# Patient Record
Sex: Male | Born: 1965 | Race: White | Hispanic: No | Marital: Single | Smoking: Current some day smoker
Health system: Southern US, Community
[De-identification: ages and names within clinical notes are randomized; demographics above are authoritative.]

## PROBLEM LIST (undated history)

## (undated) DIAGNOSIS — F419 Anxiety disorder, unspecified: Secondary | ICD-10-CM

## (undated) DIAGNOSIS — M549 Dorsalgia, unspecified: Secondary | ICD-10-CM

## (undated) DIAGNOSIS — Z87442 Personal history of urinary calculi: Secondary | ICD-10-CM

## (undated) DIAGNOSIS — M5430 Sciatica, unspecified side: Secondary | ICD-10-CM

## (undated) DIAGNOSIS — N289 Disorder of kidney and ureter, unspecified: Secondary | ICD-10-CM

## (undated) DIAGNOSIS — H548 Legal blindness, as defined in USA: Secondary | ICD-10-CM

## (undated) DIAGNOSIS — G8929 Other chronic pain: Secondary | ICD-10-CM

## (undated) DIAGNOSIS — T884XXA Failed or difficult intubation, initial encounter: Secondary | ICD-10-CM

## (undated) DIAGNOSIS — Z923 Personal history of irradiation: Secondary | ICD-10-CM

## (undated) HISTORY — PX: EYE SURGERY: SHX253

## (undated) HISTORY — PX: APPENDECTOMY: SHX54

---

## 1997-08-26 ENCOUNTER — Emergency Department (HOSPITAL_COMMUNITY): Admission: EM | Admit: 1997-08-26 | Discharge: 1997-08-26 | Payer: Self-pay | Admitting: Emergency Medicine

## 1998-03-20 ENCOUNTER — Inpatient Hospital Stay (HOSPITAL_COMMUNITY): Admission: EM | Admit: 1998-03-20 | Discharge: 1998-03-28 | Payer: Self-pay | Admitting: Emergency Medicine

## 1998-03-20 ENCOUNTER — Encounter: Payer: Self-pay | Admitting: Ophthalmology

## 1998-03-20 ENCOUNTER — Encounter: Payer: Self-pay | Admitting: Emergency Medicine

## 1998-03-21 ENCOUNTER — Encounter: Payer: Self-pay | Admitting: Ophthalmology

## 1998-04-06 ENCOUNTER — Encounter: Payer: Self-pay | Admitting: Ophthalmology

## 1998-04-06 ENCOUNTER — Ambulatory Visit (HOSPITAL_COMMUNITY): Admission: RE | Admit: 1998-04-06 | Discharge: 1998-04-06 | Payer: Self-pay | Admitting: Ophthalmology

## 1998-04-07 ENCOUNTER — Ambulatory Visit (HOSPITAL_COMMUNITY): Admission: RE | Admit: 1998-04-07 | Discharge: 1998-04-08 | Payer: Self-pay | Admitting: Ophthalmology

## 1998-04-17 ENCOUNTER — Ambulatory Visit (HOSPITAL_COMMUNITY): Admission: RE | Admit: 1998-04-17 | Discharge: 1998-04-18 | Payer: Self-pay | Admitting: Ophthalmology

## 1998-05-01 ENCOUNTER — Emergency Department (HOSPITAL_COMMUNITY): Admission: EM | Admit: 1998-05-01 | Discharge: 1998-05-01 | Payer: Self-pay

## 1998-10-02 ENCOUNTER — Ambulatory Visit (HOSPITAL_BASED_OUTPATIENT_CLINIC_OR_DEPARTMENT_OTHER): Admission: RE | Admit: 1998-10-02 | Discharge: 1998-10-02 | Payer: Self-pay | Admitting: General Surgery

## 1999-04-09 ENCOUNTER — Ambulatory Visit (HOSPITAL_COMMUNITY): Admission: RE | Admit: 1999-04-09 | Discharge: 1999-04-09 | Payer: Self-pay | Admitting: Ophthalmology

## 1999-12-14 ENCOUNTER — Emergency Department (HOSPITAL_COMMUNITY): Admission: EM | Admit: 1999-12-14 | Discharge: 1999-12-14 | Payer: Self-pay | Admitting: Emergency Medicine

## 2000-02-24 ENCOUNTER — Encounter: Payer: Self-pay | Admitting: Emergency Medicine

## 2000-02-24 ENCOUNTER — Emergency Department (HOSPITAL_COMMUNITY): Admission: EM | Admit: 2000-02-24 | Discharge: 2000-02-24 | Payer: Self-pay | Admitting: Emergency Medicine

## 2000-07-05 ENCOUNTER — Emergency Department (HOSPITAL_COMMUNITY): Admission: EM | Admit: 2000-07-05 | Discharge: 2000-07-05 | Payer: Self-pay | Admitting: Emergency Medicine

## 2000-07-05 ENCOUNTER — Encounter: Payer: Self-pay | Admitting: Emergency Medicine

## 2000-08-15 ENCOUNTER — Encounter: Payer: Self-pay | Admitting: Emergency Medicine

## 2000-08-15 ENCOUNTER — Emergency Department (HOSPITAL_COMMUNITY): Admission: EM | Admit: 2000-08-15 | Discharge: 2000-08-15 | Payer: Self-pay | Admitting: Emergency Medicine

## 2000-08-22 ENCOUNTER — Emergency Department (HOSPITAL_COMMUNITY): Admission: EM | Admit: 2000-08-22 | Discharge: 2000-08-23 | Payer: Self-pay | Admitting: Emergency Medicine

## 2000-08-23 ENCOUNTER — Ambulatory Visit (HOSPITAL_COMMUNITY): Admission: EM | Admit: 2000-08-23 | Discharge: 2000-08-23 | Payer: Self-pay | Admitting: *Deleted

## 2000-08-23 ENCOUNTER — Encounter: Payer: Self-pay | Admitting: Urology

## 2000-09-27 ENCOUNTER — Emergency Department (HOSPITAL_COMMUNITY): Admission: EM | Admit: 2000-09-27 | Discharge: 2000-09-28 | Payer: Self-pay | Admitting: Emergency Medicine

## 2000-09-28 ENCOUNTER — Encounter: Payer: Self-pay | Admitting: Emergency Medicine

## 2001-03-22 ENCOUNTER — Emergency Department (HOSPITAL_COMMUNITY): Admission: EM | Admit: 2001-03-22 | Discharge: 2001-03-22 | Payer: Self-pay

## 2001-03-22 ENCOUNTER — Emergency Department (HOSPITAL_COMMUNITY): Admission: EM | Admit: 2001-03-22 | Discharge: 2001-03-22 | Payer: Self-pay | Admitting: Emergency Medicine

## 2001-05-16 ENCOUNTER — Emergency Department (HOSPITAL_COMMUNITY): Admission: EM | Admit: 2001-05-16 | Discharge: 2001-05-17 | Payer: Self-pay | Admitting: *Deleted

## 2001-06-11 ENCOUNTER — Emergency Department (HOSPITAL_COMMUNITY): Admission: EM | Admit: 2001-06-11 | Discharge: 2001-06-11 | Payer: Self-pay

## 2001-06-11 ENCOUNTER — Encounter: Payer: Self-pay | Admitting: Emergency Medicine

## 2001-06-27 ENCOUNTER — Emergency Department (HOSPITAL_COMMUNITY): Admission: EM | Admit: 2001-06-27 | Discharge: 2001-06-27 | Payer: Self-pay | Admitting: Emergency Medicine

## 2001-07-01 ENCOUNTER — Emergency Department (HOSPITAL_COMMUNITY): Admission: EM | Admit: 2001-07-01 | Discharge: 2001-07-01 | Payer: Self-pay | Admitting: *Deleted

## 2001-07-04 ENCOUNTER — Emergency Department (HOSPITAL_COMMUNITY): Admission: EM | Admit: 2001-07-04 | Discharge: 2001-07-04 | Payer: Self-pay

## 2002-05-12 ENCOUNTER — Encounter: Payer: Self-pay | Admitting: Emergency Medicine

## 2002-05-12 ENCOUNTER — Emergency Department (HOSPITAL_COMMUNITY): Admission: EM | Admit: 2002-05-12 | Discharge: 2002-05-12 | Payer: Self-pay | Admitting: Emergency Medicine

## 2003-11-03 ENCOUNTER — Emergency Department (HOSPITAL_COMMUNITY): Admission: EM | Admit: 2003-11-03 | Discharge: 2003-11-03 | Payer: Self-pay | Admitting: Emergency Medicine

## 2004-04-09 ENCOUNTER — Emergency Department (HOSPITAL_COMMUNITY): Admission: EM | Admit: 2004-04-09 | Discharge: 2004-04-09 | Payer: Self-pay | Admitting: Emergency Medicine

## 2004-06-13 ENCOUNTER — Emergency Department (HOSPITAL_COMMUNITY): Admission: EM | Admit: 2004-06-13 | Discharge: 2004-06-13 | Payer: Self-pay | Admitting: Emergency Medicine

## 2004-06-24 ENCOUNTER — Emergency Department (HOSPITAL_COMMUNITY): Admission: EM | Admit: 2004-06-24 | Discharge: 2004-06-24 | Payer: Self-pay | Admitting: Emergency Medicine

## 2006-01-06 ENCOUNTER — Emergency Department (HOSPITAL_COMMUNITY): Admission: EM | Admit: 2006-01-06 | Discharge: 2006-01-06 | Payer: Self-pay | Admitting: Emergency Medicine

## 2006-07-17 ENCOUNTER — Emergency Department (HOSPITAL_COMMUNITY): Admission: EM | Admit: 2006-07-17 | Discharge: 2006-07-18 | Payer: Self-pay | Admitting: Emergency Medicine

## 2010-07-20 NOTE — Op Note (Signed)
[facility]  Patient:    Glen Mcmillan, Glen Mcmillan                       MRN: [id number] Proc. Date: 08/23/00 Adm. Date:  [id number] Attending:  Ephriam Knuckles H                           Operative Report  PROCEDURE:  Cystoscopy, right ureteroscopic stone extraction, right ureteral stent insertion.  PREOPERATIVE DIAGNOSIS:  Right ureterovesical junction stone.  POSTOPERATIVE DIAGNOSIS:  Right ureterovesical junction stone.  SURGEON:  Excell Seltzer. Annabell Howells, M.D.  ANESTHESIA:  General.  DRAINS:  6 French x 24 cm double-J stent.  SPECIMENS:  Stone.  COMPLICATIONS:  None.  INDICATIONS:  [name] is a 45 year old white male, who I had seen in the past for an unrelated problem, who presented to the ER today with a one week history of severe right flank pain, nausea, and new onset urinary urgency.  He previously had some symptoms dating back to May and had undergone a prior CT which revealed a right renal stone.  CT scan in the ER today revealed a right UVJ stone measuring approximately 7 mm.  After discussing the options with the patient, he elected ureteroscopy.  He is aware of the risks.  FINDINGS AND PROCEDURE:  The patient was given 1 gram of Ancef.  He was taken to the operating room where a general anesthetic was induced.  He was placed in the lithotomy position.  His perineum and genitalia were prepped with Betadine solution.  He was draped in the usual sterile fashion.  Cystoscopy was performed using the 22 [place] scope and the 12 and 70 degree lens. Examination revealed a normal urethra.  The external sphincter was intact. The prostatic urethra was short without significant lateral lobe enlargement, although the bladder neck was a bit high.  Examination of the bladder revealed a smooth bladder wall without tumor, stones, or inflammation. The ureteral orifices were in the normal anatomic position effluxing clear urine.  The guidewire was passed up the right  ureteral orifice.  Stone could be seen adjacent to the wire on fluoroscopy.  An attempt to pass the 6 French ureteroscope without dilation was unsuccessful.  A 4 cm 15 French balloon was then passed over the wire, and the ureteral meatus was dilated at 12 atmospheres.  After dilation, the 6 [place] scope was then easily passed up ureter.  The stone was visualized, and it was engaged through the 3 [place] flat wire basket.  The stone was removed without difficulty.  The cystoscope was then reinserted over the wire, and a 6 French 24 cm double-J stent was inserted without difficulty to the kidney.  The wire was removed leaving good coil in the kidney and a good coil in the bladder.  The cystoscope was removed after draining the bladder and leaving the stent string exiting the urethra. The stent string was then secured to the patients penis, and the patient was taken down from lithotomy position.  His anesthetic was reversed.  He was moved to the recovery room in stable condition, and there were no complications. DD:  08/23/00 TD:  08/23/00 Job: [id number]
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
## 2011-03-12 ENCOUNTER — Emergency Department (HOSPITAL_COMMUNITY)
Admission: EM | Admit: 2011-03-12 | Discharge: 2011-03-12 | Disposition: A | Discharge status: Home / Self Care | Payer: Self-pay | Attending: Emergency Medicine | Admitting: Emergency Medicine

## 2011-03-12 ENCOUNTER — Encounter: Payer: Self-pay | Admitting: Emergency Medicine

## 2011-03-12 DIAGNOSIS — H9209 Otalgia, unspecified ear: Secondary | ICD-10-CM | POA: Insufficient documentation

## 2011-03-12 DIAGNOSIS — R070 Pain in throat: Secondary | ICD-10-CM | POA: Insufficient documentation

## 2011-03-12 DIAGNOSIS — R110 Nausea: Secondary | ICD-10-CM | POA: Insufficient documentation

## 2011-03-12 DIAGNOSIS — J069 Acute upper respiratory infection, unspecified: Secondary | ICD-10-CM | POA: Insufficient documentation

## 2011-03-12 DIAGNOSIS — J3489 Other specified disorders of nose and nasal sinuses: Secondary | ICD-10-CM | POA: Insufficient documentation

## 2011-03-12 DIAGNOSIS — R0989 Other specified symptoms and signs involving the circulatory and respiratory systems: Secondary | ICD-10-CM | POA: Insufficient documentation

## 2011-03-12 DIAGNOSIS — F17200 Nicotine dependence, unspecified, uncomplicated: Secondary | ICD-10-CM | POA: Insufficient documentation

## 2011-03-12 DIAGNOSIS — R220 Localized swelling, mass and lump, head: Secondary | ICD-10-CM | POA: Insufficient documentation

## 2011-03-12 DIAGNOSIS — R059 Cough, unspecified: Secondary | ICD-10-CM | POA: Insufficient documentation

## 2011-03-12 DIAGNOSIS — R05 Cough: Secondary | ICD-10-CM | POA: Insufficient documentation

## 2011-03-12 DIAGNOSIS — R093 Abnormal sputum: Secondary | ICD-10-CM | POA: Insufficient documentation

## 2011-03-12 MED ORDER — FLUTICASONE PROPIONATE 50 MCG/ACT NA SUSP: 2.0000 | Freq: Every day | NASAL | Status: DC

## 2011-03-12 MED ORDER — HYDROCODONE-ACETAMINOPHEN 5-325 MG PO TABS: 2.0000 | ORAL_TABLET | ORAL | Status: AC | PRN

## 2011-03-12 MED ORDER — ONDANSETRON HCL 4 MG PO TABS: 4.0000 mg | ORAL_TABLET | Freq: Four times a day (QID) | ORAL | Status: AC

## 2011-03-12 MED ORDER — ANTIPYRINE-BENZOCAINE 5.4-1.4 % OT SOLN
3.0000 [drp] | Freq: Once | OTIC | Status: DC
  Filled 2011-03-12: qty 10

## 2011-03-12 NOTE — ED Notes (Signed)
Pt had tooth pulled a week ago on left upper side and placed on Amoxicillin 500mg . Today left ear and throat pain. Pt also c/o of productive green cough. Pt reports fever last night.

## 2011-03-12 NOTE — ED Provider Notes (Addendum)
History     CSN: [id number]  Arrival date & time 03/12/11  1031   First MD Initiated Contact with Patient 03/12/11 1103      No chief complaint on file.   (Consider location/radiation/quality/duration/timing/severity/associated sxs/prior treatment) HPI  46 year old male presenting to the chief complaints of ear pain. Patient states for the past 4 days he has been having pain to both worsen in the left ear, throat irritation, runny nose, cough productive with yellow sputum, nasal congestions, and chest congestion. He denies hearing changes.  Denies taking any other medication except amoxicillin.  He has decreased appetite and body aches. Patient states a week ago he had a dental extraction. He was given pain medication and amoxicillin. He has been taking his amoxicillin and has 2 days left.  No past medical history on file.  Past Surgical History  Procedure Date  . Eye surgery   . Appendectomy     No family history on file.  History  Substance Use Topics  . Smoking status: Current Everyday Smoker  . Smokeless tobacco: Not on file  . Alcohol Use:       Review of Systems  All other systems reviewed and are negative.    Allergies  Review of patient's allergies indicates no known allergies.  Home Medications  No current outpatient prescriptions on file.  BP 100/63  Pulse 97  Temp(Src) 98.4 F (36.9 C) (Oral)  Resp 18  SpO2 99%  Physical Exam  Nursing note and vitals reviewed. Constitutional: He appears well-developed and well-nourished.       Awake, alert, nontoxic appearance  HENT:  Head: Normocephalic and atraumatic.    Right Ear: No drainage.  Left Ear: No drainage.  Ears:  Nose: Mucosal edema and rhinorrhea present.  Mouth/Throat: Uvula is midline and mucous membranes are normal. No dental abscesses. Posterior oropharyngeal edema present. No oropharyngeal exudate, posterior oropharyngeal erythema or tonsillar abscesses.    Eyes: Right eye exhibits  no discharge. Left eye exhibits no discharge.  Neck: Neck supple.  Cardiovascular: Normal rate.   Pulmonary/Chest: Effort normal. No respiratory distress. He has no wheezes. He has no rales. He exhibits no tenderness.  Abdominal: Soft. There is no tenderness. There is no rebound.  Musculoskeletal: He exhibits no tenderness.       Baseline ROM, no obvious new focal weakness  Neurological: He is alert.       Mental status and motor strength appears baseline for patient and situation  Skin: Skin is warm and dry. No rash noted.  Psychiatric: He has a normal mood and affect.    ED Course  Procedures (including critical care time)  Labs Reviewed - No data to display No results found.   No diagnosis found.    MDM  Patient presents with symptoms consistence with upper respiratory infection.  No obvious abscess noted.  He is currently on amoxicillin, which i encourage for him to finish.  I will prescribe flonase, zofran, and short course of pain meds with follow up to ENT.  Pt voice understanding.  He is afebrile and with stable vital sign.          Fayrene Helper, PA-C 03/15/11 0723  Fayrene Helper, PA-C 03/15/11 1336

## 2011-03-15 NOTE — ED Provider Notes (Signed)
Medical screening examination/treatment/procedure(s) were performed by non-physician practitioner and as supervising physician I was immediately available for consultation/collaboration.  Doug Sou, MD 03/15/11 [phone]

## 2011-03-16 NOTE — ED Provider Notes (Signed)
Medical screening examination/treatment/procedure(s) were performed by non-physician practitioner and as supervising physician I was immediately available for consultation/collaboration.  Doug Sou, MD 03/16/11 [phone]

## 2011-03-29 ENCOUNTER — Encounter (HOSPITAL_COMMUNITY): Payer: Self-pay | Admitting: *Deleted

## 2011-03-29 ENCOUNTER — Emergency Department (HOSPITAL_COMMUNITY)
Admission: EM | Admit: 2011-03-29 | Discharge: 2011-03-29 | Disposition: A | Discharge status: Home / Self Care | Payer: Self-pay | Attending: Emergency Medicine | Admitting: Emergency Medicine

## 2011-03-29 DIAGNOSIS — M545 Low back pain, unspecified: Secondary | ICD-10-CM

## 2011-03-29 DIAGNOSIS — M79609 Pain in unspecified limb: Secondary | ICD-10-CM | POA: Insufficient documentation

## 2011-03-29 DIAGNOSIS — R209 Unspecified disturbances of skin sensation: Secondary | ICD-10-CM | POA: Insufficient documentation

## 2011-03-29 DIAGNOSIS — R29898 Other symptoms and signs involving the musculoskeletal system: Secondary | ICD-10-CM | POA: Insufficient documentation

## 2011-03-29 DIAGNOSIS — M79604 Pain in right leg: Secondary | ICD-10-CM

## 2011-03-29 DIAGNOSIS — M62838 Other muscle spasm: Secondary | ICD-10-CM | POA: Insufficient documentation

## 2011-03-29 DIAGNOSIS — M25559 Pain in unspecified hip: Secondary | ICD-10-CM | POA: Insufficient documentation

## 2011-03-29 DIAGNOSIS — F17200 Nicotine dependence, unspecified, uncomplicated: Secondary | ICD-10-CM | POA: Insufficient documentation

## 2011-03-29 MED ORDER — HYDROCODONE-ACETAMINOPHEN 5-325 MG PO TABS: ORAL_TABLET | ORAL | Status: AC

## 2011-03-29 MED ORDER — NAPROXEN 500 MG PO TABS: 500.0000 mg | ORAL_TABLET | Freq: Two times a day (BID) | ORAL | Status: DC

## 2011-03-29 MED ORDER — KETOROLAC TROMETHAMINE 60 MG/2ML IM SOLN
60.0000 mg | Freq: Once | INTRAMUSCULAR | Status: AC
  Administered 2011-03-29: 60 mg via INTRAMUSCULAR
  Filled 2011-03-29: qty 2

## 2011-03-29 NOTE — ED Provider Notes (Signed)
Medical screening examination/treatment/procedure(s) were performed by non-physician practitioner and as supervising physician I was immediately available for consultation/collaboration.   Lataisha Colan A. Lorae Roig, MD 03/29/11 1928 

## 2011-03-29 NOTE — ED Notes (Signed)
Pt states 'honestly my leg has been bothering me x 10 yrs, my medical doctor retired, can't remember the last time I had my leg seen about"; pt c/o RLE pain that begins in the hip & radiates into the leg.
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
## 2011-03-29 NOTE — ED Notes (Signed)
Pt has made an appt with Dr. Barbaraann Faster office for today.

## 2011-04-18 ENCOUNTER — Other Ambulatory Visit: Payer: Self-pay | Admitting: Family Medicine

## 2011-04-18 DIAGNOSIS — M549 Dorsalgia, unspecified: Secondary | ICD-10-CM

## 2011-04-20 ENCOUNTER — Emergency Department (HOSPITAL_COMMUNITY)
Admission: EM | Admit: 2011-04-20 | Discharge: 2011-04-20 | Disposition: A | Discharge status: Home / Self Care | Payer: Self-pay | Attending: Emergency Medicine | Admitting: Emergency Medicine

## 2011-04-20 ENCOUNTER — Encounter (HOSPITAL_COMMUNITY): Payer: Self-pay | Admitting: *Deleted

## 2011-04-20 DIAGNOSIS — F17200 Nicotine dependence, unspecified, uncomplicated: Secondary | ICD-10-CM | POA: Insufficient documentation

## 2011-04-20 DIAGNOSIS — M79609 Pain in unspecified limb: Secondary | ICD-10-CM | POA: Insufficient documentation

## 2011-04-20 DIAGNOSIS — R209 Unspecified disturbances of skin sensation: Secondary | ICD-10-CM | POA: Insufficient documentation

## 2011-04-20 DIAGNOSIS — M545 Low back pain, unspecified: Secondary | ICD-10-CM | POA: Insufficient documentation

## 2011-04-20 DIAGNOSIS — M5416 Radiculopathy, lumbar region: Secondary | ICD-10-CM

## 2011-04-20 DIAGNOSIS — IMO0001 Reserved for inherently not codable concepts without codable children: Secondary | ICD-10-CM | POA: Insufficient documentation

## 2011-04-20 DIAGNOSIS — Z79899 Other long term (current) drug therapy: Secondary | ICD-10-CM | POA: Insufficient documentation

## 2011-04-20 DIAGNOSIS — IMO0002 Reserved for concepts with insufficient information to code with codable children: Secondary | ICD-10-CM | POA: Insufficient documentation

## 2011-04-20 DIAGNOSIS — M533 Sacrococcygeal disorders, not elsewhere classified: Secondary | ICD-10-CM | POA: Insufficient documentation

## 2011-04-20 MED ORDER — OXYCODONE-ACETAMINOPHEN 5-325 MG PO TABS
2.0000 | ORAL_TABLET | Freq: Once | ORAL | Status: AC
  Administered 2011-04-20: 2 via ORAL
  Filled 2011-04-20: qty 2

## 2011-04-20 MED ORDER — PREDNISONE 20 MG PO TABS
60.0000 mg | ORAL_TABLET | Freq: Once | ORAL | Status: AC
  Administered 2011-04-20: 60 mg via ORAL
  Filled 2011-04-20: qty 3

## 2011-04-20 MED ORDER — OXYCODONE-ACETAMINOPHEN 5-325 MG PO TABS: 1.0000 | ORAL_TABLET | ORAL | Status: DC | PRN

## 2011-04-20 MED ORDER — PREDNISONE 10 MG PO TABS: 40.0000 mg | ORAL_TABLET | Freq: Every day | ORAL | Status: DC

## 2011-04-20 NOTE — Discharge Instructions (Signed)
Avoid sleeping in chairs. Continue flexeril, ibuprofen for pain. Take percocet as prescribed as needed for severe pain. Start prednisone tomorrow. Follow up as scheduled.  Lumbosacral Radiculopathy Lumbosacral radiculopathy is a pinched nerve or nerves in the low back (lumbosacral area). When this happens you may have weakness in your legs and may not be able to stand on your toes. You may have pain going down into your legs. There may be difficulties with walking normally. There are many causes of this problem. Sometimes this may happen from an injury, or simply from arthritis or boney problems. It may also be caused by other illnesses such as diabetes. If there is no improvement after treatment, further studies may be done to find the exact cause. DIAGNOSIS  X-rays may be needed if the problems become long standing. Electromyograms may be done. This study is one in which the working of nerves and muscles is studied. HOME CARE INSTRUCTIONS   Applications of ice packs may be helpful. Ice can be used in a plastic bag with a towel around it to prevent frostbite to skin. This may be used every 2 hours for 20 to 30 minutes, or as needed, while awake, or as directed by your caregiver.   Only take over-the-counter or prescription medicines for pain, discomfort, or fever as directed by your caregiver.   If physical therapy was prescribed, follow your caregiver's directions.  SEEK IMMEDIATE MEDICAL CARE IF:   You have pain not controlled with medications.   You seem to be getting worse rather than better.   You develop increasing weakness in your legs.   You develop loss of bowel or bladder control.   You have difficulty with walking or balance, or develop clumsiness in the use of your legs.   You have a fever.  MAKE SURE YOU:   Understand these instructions.   Will watch your condition.   Will get help right away if you are not doing well or get worse.  Document Released: 02/18/2005  Document Revised: 10/31/2010 Document Reviewed: 10/09/2007 [facility] Patient Information 2012 [place], [place].
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
## 2011-04-21 NOTE — ED Provider Notes (Signed)
Medical screening examination/treatment/procedure(s) were performed by non-physician practitioner and as supervising physician I was immediately available for consultation/collaboration. Beverlie Kurihara Y.   Gavin Pound. Kerith Sherley, MD 04/21/11 1101

## 2011-04-22 ENCOUNTER — Other Ambulatory Visit: Payer: Self-pay

## 2011-04-22 ENCOUNTER — Inpatient Hospital Stay
Admission: RE | Admit: 2011-04-22 | Discharge: 2011-04-22 | Payer: Self-pay | Source: Ambulatory Visit | Attending: Family Medicine | Admitting: Family Medicine

## 2011-04-25 ENCOUNTER — Encounter (HOSPITAL_COMMUNITY): Payer: Self-pay | Admitting: *Deleted

## 2011-04-25 ENCOUNTER — Emergency Department (HOSPITAL_COMMUNITY)
Admission: EM | Admit: 2011-04-25 | Discharge: 2011-04-25 | Disposition: A | Discharge status: Home / Self Care | Payer: Self-pay | Attending: Emergency Medicine | Admitting: Emergency Medicine

## 2011-04-25 DIAGNOSIS — M545 Low back pain, unspecified: Secondary | ICD-10-CM | POA: Insufficient documentation

## 2011-04-25 DIAGNOSIS — M79609 Pain in unspecified limb: Secondary | ICD-10-CM | POA: Insufficient documentation

## 2011-04-25 DIAGNOSIS — K089 Disorder of teeth and supporting structures, unspecified: Secondary | ICD-10-CM | POA: Insufficient documentation

## 2011-04-25 DIAGNOSIS — F17200 Nicotine dependence, unspecified, uncomplicated: Secondary | ICD-10-CM | POA: Insufficient documentation

## 2011-04-25 MED ORDER — PENICILLIN V POTASSIUM 250 MG PO TABS: 250.0000 mg | ORAL_TABLET | Freq: Four times a day (QID) | ORAL | Status: AC

## 2011-04-25 MED ORDER — PREDNISONE 10 MG PO TABS: 20.0000 mg | ORAL_TABLET | Freq: Every day | ORAL | Status: DC

## 2011-04-25 MED ORDER — OXYCODONE-ACETAMINOPHEN 5-325 MG PO TABS: 1.0000 | ORAL_TABLET | ORAL | Status: DC | PRN

## 2011-04-25 MED ORDER — OXYCODONE-ACETAMINOPHEN 5-325 MG PO TABS
1.0000 | ORAL_TABLET | Freq: Once | ORAL | Status: AC
  Administered 2011-04-25: 1 via ORAL
  Filled 2011-04-25: qty 1

## 2011-04-25 NOTE — ED Provider Notes (Signed)
Medical screening examination/treatment/procedure(s) were performed by non-physician practitioner and as supervising physician I was immediately available for consultation/collaboration. Devoria Albe, MD, Armando Gang   Ward Givens, MD 04/25/11 [phone]

## 2011-04-25 NOTE — Discharge Instructions (Signed)
It is important that you keep your appointment for your CT as scheduled. You have been given a small supply of pain medicine today. It is best to have one provider prescribing you pain medication if necessary, rather than the ED. Please return to your orthopedist if further medication is necessary. Return to the ER if you have high fever, are unable to walk, are not able to control your bowels or bladder, or are having other worrisome symptoms.  You've also been given a prescription for antibiotics for your tooth. Please follow up with a dentist.  RESOURCE GUIDE  Dental Problems  Patients with Medicaid: [facility] [phone] [address]                                           [phone] [address] Phone:  [phone]                                                  Phone:  [phone]  If unable to pay or uninsured, contact:  [facility] or [facility]. to become qualified for the adult dental clinic.  Chronic Pain Problems Contact [facility]  [phone] Patients need to be referred by their primary care doctor.  Insufficient Money for Medicine Contact [employer]:  call "211" or [facility] 872-067-3968.  No Primary Care Doctor Call [facility]  [phone] Other agencies that provide inexpensive medical care    [facility]  [phone]    [facility]  [phone]    [facility]  [phone]    [facility]  [phone]    [facility]  [phone]    [facility]  [phone]  Psychological Services [facility] Behavior[facility]  [phone] [facility] Services  [phone] [facility]   [phone] (emergency services [phone])  Substance Abuse Resources [facility]  626-765-4278 [facility] [phone] [facility] [phone] Floydene Flock [phone] Residential & Outpatient Substance Abuse Program   [phone]  Abuse/Neglect [facility] Child Abuse Hotline 208-609-0034 [facility] Child Abuse Hotline 712-100-5060 (After Hours)  Emergency Shelter [facility] [phone]  M[facility] [place] [facility] 615-407-4728 [facility] [phone]  MRSA Hotline #:   204-790-1876    [facility] Resources  [facility]     United Way                          [facility] Dept. [address] [place]                       [address]      [address]  [address]  Sela Hua Phone:  [phone]                                   Phone:  [phone]                 Phone:  [place] Phone:  [place] [phone] (After Hours)

## 2011-04-25 NOTE — ED Notes (Signed)
Pt reports R lower back pain that radiates to his R leg.  Pt reports he was told that it could be sciatica-has a scheduled CT next Thursday.  Pt reports numbness in his R leg and Foot.
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
## 2011-04-27 ENCOUNTER — Emergency Department (HOSPITAL_COMMUNITY)
Admission: EM | Admit: 2011-04-27 | Discharge: 2011-04-27 | Disposition: A | Discharge status: Home / Self Care | Payer: Self-pay | Attending: Emergency Medicine | Admitting: Emergency Medicine

## 2011-04-27 ENCOUNTER — Encounter (HOSPITAL_COMMUNITY): Payer: Self-pay | Admitting: Emergency Medicine

## 2011-04-27 DIAGNOSIS — K089 Disorder of teeth and supporting structures, unspecified: Secondary | ICD-10-CM | POA: Insufficient documentation

## 2011-04-27 DIAGNOSIS — F17200 Nicotine dependence, unspecified, uncomplicated: Secondary | ICD-10-CM | POA: Insufficient documentation

## 2011-04-27 DIAGNOSIS — K0889 Other specified disorders of teeth and supporting structures: Secondary | ICD-10-CM

## 2011-04-27 HISTORY — DX: Disorder of kidney and ureter, unspecified: N28.9

## 2011-04-27 MED ORDER — OXYCODONE-ACETAMINOPHEN 5-325 MG PO TABS
2.0000 | ORAL_TABLET | Freq: Once | ORAL | Status: AC
  Administered 2011-04-27: 2 via ORAL
  Filled 2011-04-27: qty 2

## 2011-04-27 NOTE — Discharge Instructions (Signed)
Dental Pain     Toothache is pain in or around a tooth. It may get worse with chewing or with cold or heat.   HOME CARE  · Your dentist may use a numbing medicine during treatment. If so, you may need to avoid eating until the medicine wears off. Ask your dentist about this.   · Only take medicine as told by your dentist or doctor.   · Avoid chewing food near the painful tooth until after all treatment is done. Ask your dentist about this.   GET HELP RIGHT AWAY IF:   · The problem gets worse or new problems appear.   · You have a fever.   · There is redness and puffiness (swelling) of the face, jaw, or neck.   · You cannot open your mouth.   · There is pain in the jaw.   · There is very bad pain that is not helped by medicine.   MAKE SURE YOU:   · Understand these instructions.   · Will watch your condition.   · Will get help right away if you are not doing well or get worse.   Document Released: 08/07/2007 Document Revised: 10/31/2010 Document Reviewed: 08/07/2007  [employer]® Patient Information ©2012 [employer].

## 2011-04-27 NOTE — ED Provider Notes (Signed)
History     CSN: [id number]  Arrival date & time 04/27/11  2031   First MD Initiated Contact with Patient 04/27/11 2049      Chief Complaint  Patient presents with  . Dental Problem    (Consider location/radiation/quality/duration/timing/severity/associated sxs/prior treatment) HPI  Pt states that he has had 3 teeth pulled yesterday, he is now experiencing severe pain in the R jaw and neck. Pt gets narcotics from Dr. Loleta Chance his neurologist. Pt received percocet from the ER two days ago, twenty pills. The dentist would not give him any narcotics because he got some from up two days ago. He presents wanting pain medicine.  Past Medical History  Diagnosis Date  . Renal disorder     kidney stones    Past Surgical History  Procedure Date  . Eye surgery   . Appendectomy     No family history on file.  History  Substance Use Topics  . Smoking status: Current Everyday Smoker -- 0.5 packs/day  . Smokeless tobacco: Not on file  . Alcohol Use: No      Review of Systems  All other systems reviewed and are negative.    Allergies  Sulfa antibiotics  Home Medications   Current Outpatient Rx  Name Route Sig Dispense Refill  . OXYCODONE-ACETAMINOPHEN 5-325 MG PO TABS Oral Take 1 tablet by mouth every 4 (four) hours as needed for pain. 20 tablet 0  . PREDNISONE 10 MG PO TABS Oral Take 2 tablets (20 mg total) by mouth daily. 14 tablet 0  . PENICILLIN V POTASSIUM 250 MG PO TABS Oral Take 1 tablet (250 mg total) by mouth 4 (four) times daily. 40 tablet 0    BP 105/68  Pulse 106  Temp(Src) 98 F (36.7 C) (Oral)  Resp 20  Wt 193 lb (87.544 kg)  SpO2 99%  Physical Exam  Nursing note and vitals reviewed. Constitutional: He appears well-developed and well-nourished.  HENT:  Head: Normocephalic and atraumatic.  Mouth/Throat: Dental caries present.    Eyes: Conjunctivae and EOM are normal. Pupils are equal, round, and reactive to light.  Neck: Normal range of motion.  Neck supple.  Cardiovascular: Normal rate and regular rhythm.   Pulmonary/Chest: Effort normal and breath sounds normal.    ED Course  Procedures (including critical care time)  Labs Reviewed - No data to display No results found.   1. Pain, dental       MDM  Pt requesting Rx for  pain medication. Request denied. I gave patient two percocets in ED and told him to ask Dr. Loleta Chance for more on Monday as he is supposed to get 90 pills tomorrow.        Dorthula Matas, PA 04/27/11 2216
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
## 2011-04-27 NOTE — ED Notes (Signed)
Pt also c/o R leg pain, is being followed by MD. Pt also c/o tingling in penis

## 2011-04-28 NOTE — ED Provider Notes (Signed)
Medical screening examination/treatment/procedure(s) were performed by non-physician practitioner and as supervising physician I was immediately available for consultation/collaboration.  Glenetta Kiger R. Bettye Sitton, MD 04/28/11 0008 

## 2011-04-29 ENCOUNTER — Ambulatory Visit: Payer: Self-pay | Admitting: Family

## 2011-04-29 DIAGNOSIS — Z0289 Encounter for other administrative examinations: Secondary | ICD-10-CM

## 2011-05-02 ENCOUNTER — Ambulatory Visit (INDEPENDENT_AMBULATORY_CARE_PROVIDER_SITE_OTHER): Payer: Self-pay | Admitting: Family

## 2011-05-02 ENCOUNTER — Encounter: Payer: Self-pay | Admitting: Family

## 2011-05-02 ENCOUNTER — Inpatient Hospital Stay: Admission: RE | Admit: 2011-05-02 | Payer: Self-pay | Source: Ambulatory Visit

## 2011-05-02 ENCOUNTER — Other Ambulatory Visit: Payer: Self-pay | Admitting: Family Medicine

## 2011-05-02 ENCOUNTER — Inpatient Hospital Stay
Admission: RE | Admit: 2011-05-02 | Discharge: 2011-05-02 | Payer: Self-pay | Source: Ambulatory Visit | Attending: Family Medicine | Admitting: Family Medicine

## 2011-05-02 ENCOUNTER — Other Ambulatory Visit: Payer: Self-pay

## 2011-05-02 VITALS — BP 136/90 | Temp 98.6°F | Ht 68.0 in | Wt 196.0 lb

## 2011-05-02 DIAGNOSIS — M545 Low back pain, unspecified: Secondary | ICD-10-CM

## 2011-05-02 DIAGNOSIS — M5416 Radiculopathy, lumbar region: Secondary | ICD-10-CM

## 2011-05-02 DIAGNOSIS — M549 Dorsalgia, unspecified: Secondary | ICD-10-CM

## 2011-05-02 DIAGNOSIS — IMO0002 Reserved for concepts with insufficient information to code with codable children: Secondary | ICD-10-CM

## 2011-05-02 MED ORDER — OXYCODONE-ACETAMINOPHEN 5-325 MG PO TABS: 1.0000 | ORAL_TABLET | ORAL | Status: DC | PRN

## 2011-05-02 NOTE — Patient Instructions (Signed)
1. YOU MUST HAVE CT SCAN AS THE ED INSTRUCTED. 2. WE CAN MAKE APPROPRIATE REFERRALS THEREAFTER.  3. CHRONIC PAIN WILL BE MANAGED BY A SPECIALIST. WE WILL TAKE CARE OF OTHER CONCERNS OUTSIDE OF PAIN.    Chronic Pain Chronic pain can be defined as pain that is lasting, off and on, and lasts for 3 to 6 months or longer. Many things cause chronic pain, which can make it difficult to make a discrete diagnosis. There are many treatment options available for chronic pain. However, finding a treatment that works well for you may require trying various approaches until a suitable one is found. CAUSES  In some types of chronic medical conditions, the pain is caused by a normal pain response within the body. A normal pain response helps the body identify illness or injury and prevent further damage from being done. In these cases, the cause of the pain may be identified and treated, even if it may not be cured completely. Examples of chronic conditions which can cause chronic pain include:  Inflammation of the joints (arthritis).   Back pain or neck pain (including bulging or herniated disks).   Migraine headaches.   Cancer.  In some other types of chronic pain syndromes, the pain is caused by an abnormal pain response within the body. An abnormal pain response is present when there is no ongoing cause (or stimulus) for the pain, or when the cause of the pain is arising from the nerves or nervous system itself. Examples of conditions which can cause chronic pain due to an abnormal pain response include:  Fibromyalgia.   Reflex sympathetic dystrophy (RSD).   Neuropathy (when the nerves themselves are damaged, and may cause pain).  DIAGNOSIS  Your caregiver will help diagnose your condition over time. In many cases, the initial focus will be on excluding conditions that could be causing the pain. Depending on your symptoms, your caregiver may order some tests to diagnose your condition. Some of these  tests include:  Blood tests.   Computerized X-ray scans (CT scan).   Computerized magnetic scans (MRI).   X-rays.   Ultrasounds.   Nerve conduction studies.   Consultation with other physicians or specialists.  TREATMENT  There are many treatment options for people suffering from chronic pain. Finding a treatment that works well may take time.   You may be referred to a pain management specialist.   You may be put on medication to help with the pain. Unfortunately, some medications (such as opiate medications) may not be very effective in cases where chronic pain is due to abnormal pain responses. Finding the right medications can take some time.   Adjunctive therapies may be used to provide additional relief and improve a patient's quality of life. These therapies include:   Mindfulness meditation.   Acupuncture.   Biofeedback.   Cognitive-behavioral therapy.   In certain cases, surgical interventions may be attempted.  HOME CARE INSTRUCTIONS   Make sure you understand these instructions prior to discharge.   Ask any questions and share any further concerns you have with your caregiver prior to discharge.   Take all medications as directed by your caregiver.   Keep all follow-up appointments.  SEEK MEDICAL CARE IF:   Your pain gets worse.   You develop a new pain that was not present before.   You cannot tolerate any medications prescribed by your caregiver.   You develop new symptoms since your last visit with your caregiver.  SEEK IMMEDIATE MEDICAL  CARE IF:   You develop muscular weakness.   You have decreased sensation or numbness.   You lose control of bowel or bladder function.   Your pain suddenly gets much worse.   You have an oral temperature above 102 F (38.9 C), not controlled by medication.   You develop shaking chills, confusion, chest pain, or shortness of breath.  Document Released: 11/10/2001 Document Revised: 10/31/2010 Document  Reviewed: 02/17/2008 [facility] Patient Information 2012 [place], [place].
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
## 2011-05-03 ENCOUNTER — Other Ambulatory Visit: Payer: Self-pay

## 2011-05-05 ENCOUNTER — Emergency Department (HOSPITAL_COMMUNITY)
Admission: EM | Admit: 2011-05-05 | Discharge: 2011-05-05 | Disposition: A | Discharge status: Home / Self Care | Payer: Self-pay | Attending: Emergency Medicine | Admitting: Emergency Medicine

## 2011-05-05 DIAGNOSIS — M545 Low back pain, unspecified: Secondary | ICD-10-CM | POA: Insufficient documentation

## 2011-05-05 DIAGNOSIS — M79609 Pain in unspecified limb: Secondary | ICD-10-CM | POA: Insufficient documentation

## 2011-05-05 DIAGNOSIS — M5380 Other specified dorsopathies, site unspecified: Secondary | ICD-10-CM | POA: Insufficient documentation

## 2011-05-05 DIAGNOSIS — M549 Dorsalgia, unspecified: Secondary | ICD-10-CM

## 2011-05-05 MED ORDER — OXYCODONE-ACETAMINOPHEN 5-325 MG PO TABS: 1.0000 | ORAL_TABLET | Freq: Four times a day (QID) | ORAL | Status: AC | PRN

## 2011-05-05 MED ORDER — OXYCODONE-ACETAMINOPHEN 5-325 MG PO TABS
1.0000 | ORAL_TABLET | Freq: Once | ORAL | Status: AC
  Administered 2011-05-05: 1 via ORAL
  Filled 2011-05-05: qty 1

## 2011-05-05 MED ORDER — CYCLOBENZAPRINE HCL 5 MG PO TABS: 5.0000 mg | ORAL_TABLET | Freq: Three times a day (TID) | ORAL | Status: AC | PRN

## 2011-05-05 MED ORDER — NAPROXEN 500 MG PO TABS: 500.0000 mg | ORAL_TABLET | Freq: Two times a day (BID) | ORAL | Status: DC
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
## 2011-05-05 NOTE — ED Notes (Signed)
Pt. Stated, I'm having sciatic pain , back and leg pain

## 2011-05-09 ENCOUNTER — Inpatient Hospital Stay: Admission: RE | Admit: 2011-05-09 | Payer: Self-pay | Source: Ambulatory Visit

## 2011-05-09 ENCOUNTER — Inpatient Hospital Stay
Admission: RE | Admit: 2011-05-09 | Discharge: 2011-05-09 | Payer: Self-pay | Source: Ambulatory Visit | Attending: Family Medicine | Admitting: Family Medicine

## 2011-05-11 ENCOUNTER — Emergency Department (HOSPITAL_COMMUNITY)
Admission: EM | Admit: 2011-05-11 | Discharge: 2011-05-11 | Disposition: A | Discharge status: Home / Self Care | Payer: Self-pay | Attending: Emergency Medicine | Admitting: Emergency Medicine

## 2011-05-11 ENCOUNTER — Encounter (HOSPITAL_COMMUNITY): Payer: Self-pay | Admitting: *Deleted

## 2011-05-11 DIAGNOSIS — M5416 Radiculopathy, lumbar region: Secondary | ICD-10-CM

## 2011-05-11 DIAGNOSIS — M545 Low back pain, unspecified: Secondary | ICD-10-CM | POA: Insufficient documentation

## 2011-05-11 DIAGNOSIS — R112 Nausea with vomiting, unspecified: Secondary | ICD-10-CM | POA: Insufficient documentation

## 2011-05-11 DIAGNOSIS — IMO0002 Reserved for concepts with insufficient information to code with codable children: Secondary | ICD-10-CM | POA: Insufficient documentation

## 2011-05-11 DIAGNOSIS — F17200 Nicotine dependence, unspecified, uncomplicated: Secondary | ICD-10-CM | POA: Insufficient documentation

## 2011-05-11 HISTORY — DX: Dorsalgia, unspecified: M54.9

## 2011-05-11 MED ORDER — PREDNISONE 50 MG PO TABS: 50.0000 mg | ORAL_TABLET | Freq: Every day | ORAL | Status: AC

## 2011-05-11 MED ORDER — DIAZEPAM 5 MG PO TABS
5.0000 mg | ORAL_TABLET | Freq: Once | ORAL | Status: AC
  Administered 2011-05-11: 5 mg via ORAL
  Filled 2011-05-11: qty 1

## 2011-05-11 MED ORDER — DIAZEPAM 5 MG PO TABS: 5.0000 mg | ORAL_TABLET | Freq: Two times a day (BID) | ORAL | Status: AC

## 2011-05-11 MED ORDER — OXYCODONE-ACETAMINOPHEN 5-325 MG PO TABS
1.0000 | ORAL_TABLET | Freq: Four times a day (QID) | ORAL | Status: DC | PRN
Start: 2011-05-11 — End: 2011-05-11

## 2011-05-11 MED ORDER — PREDNISONE 50 MG PO TABS
50.0000 mg | ORAL_TABLET | Freq: Once | ORAL | Status: AC
  Administered 2011-05-11: 50 mg via ORAL
  Filled 2011-05-11: qty 1

## 2011-05-11 MED ORDER — OXYCODONE-ACETAMINOPHEN 5-325 MG PO TABS: 2.0000 | ORAL_TABLET | Freq: Four times a day (QID) | ORAL | Status: AC | PRN

## 2011-05-11 MED ORDER — KETOROLAC TROMETHAMINE 60 MG/2ML IM SOLN
60.0000 mg | Freq: Once | INTRAMUSCULAR | Status: AC
  Administered 2011-05-11: 60 mg via INTRAMUSCULAR
  Filled 2011-05-11: qty 2

## 2011-05-11 MED ORDER — MORPHINE SULFATE 4 MG/ML IJ SOLN
6.0000 mg | Freq: Once | INTRAMUSCULAR | Status: AC
  Administered 2011-05-11: 6 mg via INTRAMUSCULAR
  Filled 2011-05-11 (×2): qty 1

## 2011-05-11 NOTE — ED Provider Notes (Signed)
History     CSN: [id number]  Arrival date & time 05/11/11  1205   First MD Initiated Contact with Patient 05/11/11 1402      Chief Complaint  Patient presents with  . Back Pain    lower  . Nausea  . Emesis    (Consider location/radiation/quality/duration/timing/severity/associated sxs/prior treatment) HPI Vision since the emergency department with recurrent chronic back pain.  He states morning his pain got worse and was starting to radiate down his leg more.  He has been followed by Dr. Prince Rome for this issue.  He states that it hurts to stand straight up and bent over to walk.  He states he vomited once because of the intense pain.  He denies numbness or weakness of his lower extremities.  He states his tingling in his feet. Past Medical History  Diagnosis Date  . Renal disorder     kidney stones  . Back pain     Past Surgical History  Procedure Date  . Eye surgery   . Appendectomy     History reviewed. No pertinent family history.  History  Substance Use Topics  . Smoking status: Current Everyday Smoker -- 0.5 packs/day    Types: Cigarettes  . Smokeless tobacco: Never Used  . Alcohol Use: No      Review of Systems All pertinent positives and negatives reviewed in the history of present illness  Allergies  Sulfa antibiotics  Home Medications   Current Outpatient Rx  Name Route Sig Dispense Refill  . CYCLOBENZAPRINE HCL 5 MG PO TABS Oral Take 1 tablet (5 mg total) by mouth 3 (three) times daily as needed for muscle spasms. 21 tablet 0  . OXYCODONE-ACETAMINOPHEN 5-325 MG PO TABS Oral Take 1 tablet by mouth every 6 (six) hours as needed for pain. 16 tablet 0  . DIAZEPAM 5 MG PO TABS Oral Take 1 tablet (5 mg total) by mouth 2 (two) times daily. 10 tablet 0  . OXYCODONE-ACETAMINOPHEN 5-325 MG PO TABS Oral Take 2 tablets by mouth every 6 (six) hours as needed for pain. 20 tablet 0  . PREDNISONE 50 MG PO TABS Oral Take 1 tablet (50 mg total) by mouth daily. 5 tablet  0    BP 130/79  Pulse 94  Temp(Src) 98.2 F (36.8 C) (Oral)  Resp 20  Ht 5\' 8"  (1.727 m)  Wt 194 lb 9.6 oz (88.27 kg)  BMI 29.59 kg/m2  SpO2 100%  Physical Exam  Constitutional: He is oriented to person, place, and time. He appears well-developed and well-nourished. He appears distressed.  HENT:  Head: Normocephalic and atraumatic.  Cardiovascular: Normal rate and regular rhythm.   Pulmonary/Chest: Effort normal and breath sounds normal.  Neurological: He is alert and oriented to person, place, and time. He has normal strength. He displays normal reflexes. No sensory deficit. Coordination and gait normal.  Reflex Scores:      Patellar reflexes are 2+ on the right side and 2+ on the left side.      Achilles reflexes are 2+ on the right side and 2+ on the left side. Skin: No rash noted.    ED Course  Procedures (including critical care time)  Labs Reviewed - No data to display No results found.   1. Lumbar radiculopathy    he has no neuro deficits and normal reflexes on exam here in the emergency department.  He still to return here for any worsening in his condition.  Advised that he will need to see  the doctor who is following him prescribed back pain.  Told to return here as needed for any worsening in his condition.  I reviewed his previous visits and charting.  MDM          Carlyle Dolly, PA-C 05/11/11 [phone]

## 2011-05-11 NOTE — Discharge Instructions (Signed)
Return here as needed.  Followup the primary care Dr. for recheck.

## 2011-05-11 NOTE — ED Notes (Signed)
Pt from home with reports of lower back pain radiating down right leg, hx of chronic back pain for 6 months, pt endorses nausea and vomiting "from the pain", pt also reports that he is unable to stand up straight.

## 2011-05-13 NOTE — ED Provider Notes (Signed)
Medical screening examination/treatment/procedure(s) were performed by non-physician practitioner and as supervising physician I was immediately available for consultation/collaboration.   Laray Anger, DO 05/13/11 1102

## 2011-09-20 ENCOUNTER — Encounter (HOSPITAL_COMMUNITY): Payer: Self-pay

## 2011-09-20 ENCOUNTER — Emergency Department (HOSPITAL_COMMUNITY)
Admission: EM | Admit: 2011-09-20 | Discharge: 2011-09-20 | Disposition: A | Discharge status: Home / Self Care | Payer: Self-pay | Attending: Emergency Medicine | Admitting: Emergency Medicine

## 2011-09-20 DIAGNOSIS — M5416 Radiculopathy, lumbar region: Secondary | ICD-10-CM

## 2011-09-20 DIAGNOSIS — G8929 Other chronic pain: Secondary | ICD-10-CM | POA: Insufficient documentation

## 2011-09-20 DIAGNOSIS — F17200 Nicotine dependence, unspecified, uncomplicated: Secondary | ICD-10-CM | POA: Insufficient documentation

## 2011-09-20 DIAGNOSIS — M545 Low back pain, unspecified: Secondary | ICD-10-CM | POA: Insufficient documentation

## 2011-09-20 MED ORDER — CYCLOBENZAPRINE HCL 10 MG PO TABS
10.0000 mg | ORAL_TABLET | Freq: Once | ORAL | Status: AC
  Administered 2011-09-20: 10 mg via ORAL
  Filled 2011-09-20: qty 1

## 2011-09-20 MED ORDER — ACETAMINOPHEN 325 MG PO TABS
650.0000 mg | ORAL_TABLET | Freq: Once | ORAL | Status: AC
  Administered 2011-09-20: 650 mg via ORAL
  Filled 2011-09-20: qty 2

## 2011-09-20 MED ORDER — CYCLOBENZAPRINE HCL 10 MG PO TABS: 10.0000 mg | ORAL_TABLET | Freq: Three times a day (TID) | ORAL | Status: AC | PRN

## 2011-09-20 MED ORDER — DEXAMETHASONE 6 MG PO TABS: ORAL_TABLET | ORAL | Status: DC

## 2011-09-20 MED ORDER — TRAMADOL HCL 50 MG PO TABS
100.0000 mg | ORAL_TABLET | Freq: Once | ORAL | Status: AC
  Administered 2011-09-20: 100 mg via ORAL
  Filled 2011-09-20: qty 1

## 2011-09-20 MED ORDER — TRAMADOL-ACETAMINOPHEN 37.5-325 MG PO TABS: ORAL_TABLET | ORAL | Status: AC

## 2011-09-20 MED ORDER — DEXAMETHASONE SODIUM PHOSPHATE 4 MG/ML IJ SOLN
8.0000 mg | Freq: Once | INTRAMUSCULAR | Status: DC
  Filled 2011-09-20: qty 2

## 2011-09-20 NOTE — ED Notes (Signed)
Complains of buttock and leg burning pain.
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
## 2011-10-03 ENCOUNTER — Ambulatory Visit: Payer: Self-pay | Admitting: Family Medicine

## 2011-10-03 DIAGNOSIS — Z0271 Encounter for disability determination: Secondary | ICD-10-CM

## 2011-12-13 ENCOUNTER — Telehealth: Payer: Self-pay

## 2011-12-13 ENCOUNTER — Ambulatory Visit: Payer: Self-pay | Admitting: Family Medicine

## 2011-12-13 VITALS — BP 103/69 | HR 86 | Temp 98.0°F | Resp 16 | Ht 68.3 in | Wt 200.8 lb

## 2011-12-13 DIAGNOSIS — G894 Chronic pain syndrome: Secondary | ICD-10-CM

## 2011-12-13 DIAGNOSIS — G8929 Other chronic pain: Secondary | ICD-10-CM

## 2011-12-13 MED ORDER — OXYCODONE-ACETAMINOPHEN 10-325 MG PO TABS: 1.0000 | ORAL_TABLET | Freq: Four times a day (QID) | ORAL | Status: DC | PRN

## 2011-12-13 NOTE — Telephone Encounter (Signed)
Pt states he cannot afford the $200 oxycodone w/acetaminophen;  The one without acetaminophen is only $110.  Please call pt [phone] (M)
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
## 2011-12-13 NOTE — Patient Instructions (Addendum)
Pain Medicine Instructions You have been given a prescription for pain medicines. These medicines may affect your ability to think clearly. They may also affect your ability to perform physical activities. Take these medicines only as needed for pain. You do not need to take them if you are not having pain, unless directed by your caregiver. You can take less than the prescribed dose if you find a smaller amount of medicine controls the pain. It may not be possible to make all of your pain go away, but you should be comfortable enough to move, breathe, and take care of yourself. After you start taking pain medicines, while taking the medicines, and for 8 hours after stopping the medicines:  Do not drive.  Do not operate machinery.  Do not operate power tools.  Do not sign legal documents.  Do not supervise children by yourself.  Do not participate in activities that require climbing or being in high places.  Do not enter a body of water (lake, river, ocean, spa, swimming pool) without an adult nearby who can help you. You may have been prescribed a pain medicine that contains acetaminophen (paracetamol). If so, take only the amount directed by your caregiver. Do not take any other acetaminophen while taking this medicine. An overdose of acetaminophen can result in severe liver damage. If you are taking other medicines, check the active ingredients for acetaminophen. Acetaminophen is found in hundreds of over-the-counter and prescription medicines. These include cold relief products, menstrual cramp relief medicines, fever-reducing medicines, acid indigestion relief products, and pain relief products. HOME CARE INSTRUCTIONS   Do not drink alcohol, take sleeping pills, or take other medicines until at least 8 hours after your last dose of pain medicine, or as directed by your caregiver.  Use a bulk stool softener if you become constipated from your pain medicines. Increasing your intake of fruits  and vegetables will also help.  Write down the times when you take your medicines. Look at the times before taking your next dose of medicine. It is easy to become confused while on pain medicines. Recording the times helps you to avoid an overdose. SEEK MEDICAL CARE IF:  Your medicine is not helping the pain go away.  You vomit or have diarrhea shortly after taking the medicine.  You develop new pain in areas that did not hurt before. SEEK IMMEDIATE MEDICAL CARE IF:  You feel dizzy or faint.  You feel there are other problems that might be caused by your medicine. MAKE SURE YOU:   Understand these instructions.  Will watch your condition.  Will get help right away if you are not doing well or get worse. Document Released: 05/27/2000 Document Revised: 05/13/2011 Document Reviewed: 02/02/2010 Exit[employer] Patient Information 2013 [employer].  

## 2011-12-15 NOTE — Telephone Encounter (Signed)
What would you like to do ?

## 2011-12-16 ENCOUNTER — Telehealth: Payer: Self-pay

## 2011-12-16 NOTE — Telephone Encounter (Signed)
Pt friend, Marlon Pel (she was here with pt 12/13/11), called to let us know that we rx'd 120 oxycontin on Friday. Per Doristine Johns, pt immediately sold 110 pills and snorted the other 10.   Doristine Johns  [phone]

## 2011-12-26 NOTE — Telephone Encounter (Signed)
Encounter is still open.

## 2011-12-26 NOTE — Telephone Encounter (Signed)
Dr Milus Glazier, I just wanted to make sure that you saw this message FYI when it was routed to you previously. Once you have reviewed the message you may close the encounter.

## 2012-01-01 NOTE — Telephone Encounter (Signed)
Thanks for the information

## 2012-01-04 ENCOUNTER — Telehealth: Payer: Self-pay

## 2012-01-04 NOTE — Telephone Encounter (Signed)
PT SAYS HE NEEDS TO COME IN AND SEE DR. L.  HE WILL NOT HAVE ANY MONEY UNTIL Monday.  WANTS TO KNOW IF DR. L WILL SEE HIM ANYWAY.  I TOLD HIM OUR POLICY WAS TO PIF AT THE TIME OF SERVICE.  WANTS A CALL BACK REGARDLESS. [phone]

## 2012-01-05 NOTE — Telephone Encounter (Signed)
Spoke with patient and he just wanted to know what was going on with pain clinic ref. He has called everyday last and spoke to the girl from pain clinic and they keep saying they haven't received anything from [place]. He has spoke to a couple people here who say they have faxed over everything they needed. Can we please look in to what's going on with pain clinic ref please and let patient know. thanks
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
## 2012-01-07 ENCOUNTER — Encounter: Payer: Self-pay | Admitting: Family

## 2012-01-07 ENCOUNTER — Ambulatory Visit (INDEPENDENT_AMBULATORY_CARE_PROVIDER_SITE_OTHER): Payer: Self-pay | Admitting: Family

## 2012-01-07 ENCOUNTER — Telehealth: Payer: Self-pay

## 2012-01-07 VITALS — BP 110/80 | HR 102 | Wt 188.0 lb

## 2012-01-07 DIAGNOSIS — IMO0002 Reserved for concepts with insufficient information to code with codable children: Secondary | ICD-10-CM

## 2012-01-07 DIAGNOSIS — G8929 Other chronic pain: Secondary | ICD-10-CM

## 2012-01-07 MED ORDER — OXYCODONE-ACETAMINOPHEN 10-325 MG PO TABS: 1.0000 | ORAL_TABLET | Freq: Four times a day (QID) | ORAL | Status: DC | PRN

## 2012-01-07 NOTE — Patient Instructions (Addendum)

## 2012-01-07 NOTE — Progress Notes (Signed)
Subjective:    Patient ID: Glen Mcmillan, male    DOB: October 16, 1965, 46 y.o.   MRN: [id number]  HPI 46 year old white male, smoker, is in with chronic lumbar radiculopathy. He was seen at urgent care clinic 3 weeks ago was prescribed Percocet for 30 days and referred to the pain clinic. Patient reports at the pain clinic he was referred to is a cash pay only, and cannot afford to pay [phone]. Reports have been low back pain and numbness down his right leg. He has 2 pills left from his prescription from the urgent care clinic. He has seen the emergency department multiple times for chronic pain.   Review of Systems  Constitutional: Negative.   Respiratory: Negative.   Cardiovascular: Negative.  Negative for leg swelling.  Gastrointestinal: Negative.   Genitourinary: Negative.   Musculoskeletal: Negative.   Skin: Negative.   Neurological: Positive for numbness.       Numbness down the right leg to the foot  Hematological: Negative.   Psychiatric/Behavioral: Negative.    Past Medical History  Diagnosis Date  . Renal disorder     kidney stones  . Back pain     History   Social History  . Marital Status: Single    Spouse Name: N/A    Number of Children: N/A  . Years of Education: N/A   Occupational History  . salesman    Social History Main Topics  . Smoking status: Current Every Day Smoker -- 1.0 packs/day    Types: Cigarettes  . Smokeless tobacco: Never Used  . Alcohol Use: No  . Drug Use: No  . Sexually Active: Not on file   Other Topics Concern  . Not on file   Social History Narrative  . No narrative on file    Past Surgical History  Procedure Date  . Eye surgery   . Appendectomy     Family History  Problem Relation Age of Onset  . Diabetes Father     deceased  . Cancer Mother     breast  . Heart failure Mother     pacemaker  . Heart failure Father     Allergies  Allergen Reactions  . Sulfa Antibiotics Itching    Current Outpatient Prescriptions  on File Prior to Visit  Medication Sig Dispense Refill  . dexamethasone (DECADRON) 6 MG tablet Take 1 po BID x 5 days then 1 po QD x 5days  15 tablet  0    BP 110/80  Pulse 102  Wt 188 lb (85.276 kg)  SpO2 98%chart    Objective:   Physical Exam  Constitutional: He is oriented to person, place, and time. He appears well-developed and well-nourished.  Cardiovascular: Normal rate, regular rhythm and normal heart sounds.   Pulmonary/Chest: Effort normal and breath sounds normal.  Abdominal: Soft. Bowel sounds are normal.  Musculoskeletal: Normal range of motion.  Neurological: He is alert and oriented to person, place, and time. He has normal reflexes. He displays normal reflexes. No cranial nerve deficit. He exhibits normal muscle tone. Coordination normal.  Skin: Skin is warm and dry.  Psychiatric: He has a normal mood and affect.          Assessment & Plan:  Assessment: Chronic lumbar radiculopathy, low back pain  Plan: Patient must see the pain clinic. Will refer to a different pain clinic. I have dated his Percocet prescription to be filled on 01/13/2012. I will not fill any more chronic pain medication. He will have to  see specialist.

## 2012-01-08 ENCOUNTER — Telehealth: Payer: Self-pay

## 2012-01-08 ENCOUNTER — Telehealth: Payer: Self-pay | Admitting: Family
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
## 2012-01-08 NOTE — Telephone Encounter (Signed)
Pt advised of Padonda's message in previous note.   Pt asks that lost Rx please be cancelled and another one be written to be filled on Monday 01/13/12, just like the other Rx. He states that he really needs his meds and with the law wouldn't allow the medication to be picked up without ID. Please advise

## 2012-01-08 NOTE — Telephone Encounter (Signed)
Pt called to check if we had approved request for lost script. It was written for 01/13/12. Pls call [phone]

## 2012-01-08 NOTE — Telephone Encounter (Signed)
Left message to notify pt, per Padonda, another rx cannot be given, pt is responsible for the rx therefore it will not be stopped and another one will not be written at this time. Pt advised to call with questions or concerns

## 2012-01-10 NOTE — Telephone Encounter (Signed)
I do not replace lost narcotic RX.

## 2012-02-09 ENCOUNTER — Emergency Department (HOSPITAL_COMMUNITY)
Admission: EM | Admit: 2012-02-09 | Discharge: 2012-02-09 | Disposition: A | Discharge status: Home / Self Care | Payer: Self-pay | Attending: Emergency Medicine | Admitting: Emergency Medicine

## 2012-02-09 DIAGNOSIS — Z0289 Encounter for other administrative examinations: Secondary | ICD-10-CM | POA: Insufficient documentation

## 2012-02-12 ENCOUNTER — Ambulatory Visit: Payer: Self-pay | Admitting: Family Medicine

## 2012-02-13 ENCOUNTER — Ambulatory Visit: Payer: Self-pay | Admitting: Family

## 2012-02-20 ENCOUNTER — Other Ambulatory Visit: Payer: Self-pay | Admitting: Family Medicine

## 2012-02-20 DIAGNOSIS — M5410 Radiculopathy, site unspecified: Secondary | ICD-10-CM

## 2012-02-20 DIAGNOSIS — M549 Dorsalgia, unspecified: Secondary | ICD-10-CM

## 2012-02-27 ENCOUNTER — Other Ambulatory Visit: Payer: Self-pay | Admitting: Radiology

## 2012-02-28 ENCOUNTER — Ambulatory Visit (HOSPITAL_COMMUNITY): Admission: RE | Admit: 2012-02-28 | Payer: Self-pay | Source: Ambulatory Visit

## 2012-02-28 ENCOUNTER — Inpatient Hospital Stay (HOSPITAL_COMMUNITY)
Admission: RE | Admit: 2012-02-28 | Discharge: 2012-02-28 | Payer: Self-pay | Source: Ambulatory Visit | Attending: Family Medicine | Admitting: Family Medicine

## 2012-03-13 ENCOUNTER — Emergency Department (HOSPITAL_COMMUNITY)
Admission: EM | Admit: 2012-03-13 | Discharge: 2012-03-13 | Disposition: A | Discharge status: Home / Self Care | Payer: Self-pay | Attending: Emergency Medicine | Admitting: Emergency Medicine

## 2012-03-13 ENCOUNTER — Encounter (HOSPITAL_COMMUNITY): Payer: Self-pay | Admitting: Family Medicine

## 2012-03-13 DIAGNOSIS — Z87448 Personal history of other diseases of urinary system: Secondary | ICD-10-CM | POA: Insufficient documentation

## 2012-03-13 DIAGNOSIS — Z8739 Personal history of other diseases of the musculoskeletal system and connective tissue: Secondary | ICD-10-CM | POA: Insufficient documentation

## 2012-03-13 DIAGNOSIS — Y92009 Unspecified place in unspecified non-institutional (private) residence as the place of occurrence of the external cause: Secondary | ICD-10-CM | POA: Insufficient documentation

## 2012-03-13 DIAGNOSIS — T2135XA Burn of third degree of buttock, initial encounter: Secondary | ICD-10-CM

## 2012-03-13 DIAGNOSIS — Y9389 Activity, other specified: Secondary | ICD-10-CM | POA: Insufficient documentation

## 2012-03-13 DIAGNOSIS — X19XXXA Contact with other heat and hot substances, initial encounter: Secondary | ICD-10-CM | POA: Insufficient documentation

## 2012-03-13 DIAGNOSIS — F17200 Nicotine dependence, unspecified, uncomplicated: Secondary | ICD-10-CM | POA: Insufficient documentation

## 2012-03-13 DIAGNOSIS — T2134XA Burn of third degree of lower back, initial encounter: Secondary | ICD-10-CM | POA: Insufficient documentation

## 2012-03-13 MED ORDER — HYDROCODONE-ACETAMINOPHEN 5-325 MG PO TABS
2.0000 | ORAL_TABLET | Freq: Once | ORAL | Status: AC
  Administered 2012-03-13: 2 via ORAL
  Filled 2012-03-13: qty 2

## 2012-03-13 MED ORDER — BACITRACIN ZINC 500 UNIT/GM EX OINT: TOPICAL_OINTMENT | Freq: Two times a day (BID) | CUTANEOUS | Status: DC

## 2012-03-13 MED ORDER — OXYCODONE-ACETAMINOPHEN 5-325 MG PO TABS: 2.0000 | ORAL_TABLET | Freq: Four times a day (QID) | ORAL | Status: DC | PRN
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
## 2012-03-13 NOTE — ED Provider Notes (Signed)
Medical screening examination/treatment/procedure(s) were performed by non-physician practitioner and as supervising physician I was immediately available for consultation/collaboration.   Glynn Octave, MD 03/13/12 [phone]

## 2012-03-13 NOTE — ED Notes (Signed)
Pt mother picking him up.

## 2012-03-21 ENCOUNTER — Emergency Department (HOSPITAL_COMMUNITY)
Admission: EM | Admit: 2012-03-21 | Discharge: 2012-03-21 | Discharge status: Against Medical Advice | Payer: Self-pay | Attending: Emergency Medicine | Admitting: Emergency Medicine

## 2012-03-21 DIAGNOSIS — Z5320 Procedure and treatment not carried out because of patient's decision for unspecified reasons: Secondary | ICD-10-CM | POA: Insufficient documentation

## 2012-03-21 NOTE — ED Notes (Signed)
Pt seen ambulating out of department at 1105

## 2012-03-22 ENCOUNTER — Encounter (HOSPITAL_COMMUNITY): Payer: Self-pay | Admitting: Nurse Practitioner

## 2012-03-22 ENCOUNTER — Emergency Department (HOSPITAL_COMMUNITY)
Admission: EM | Admit: 2012-03-22 | Discharge: 2012-03-22 | Disposition: A | Discharge status: Home / Self Care | Payer: Self-pay | Attending: Emergency Medicine | Admitting: Emergency Medicine

## 2012-03-22 DIAGNOSIS — Z4800 Encounter for change or removal of nonsurgical wound dressing: Secondary | ICD-10-CM | POA: Insufficient documentation

## 2012-03-22 DIAGNOSIS — Z5189 Encounter for other specified aftercare: Secondary | ICD-10-CM

## 2012-03-22 DIAGNOSIS — Z87442 Personal history of urinary calculi: Secondary | ICD-10-CM | POA: Insufficient documentation

## 2012-03-22 DIAGNOSIS — Y929 Unspecified place or not applicable: Secondary | ICD-10-CM | POA: Insufficient documentation

## 2012-03-22 DIAGNOSIS — X19XXXA Contact with other heat and hot substances, initial encounter: Secondary | ICD-10-CM | POA: Insufficient documentation

## 2012-03-22 DIAGNOSIS — F17200 Nicotine dependence, unspecified, uncomplicated: Secondary | ICD-10-CM | POA: Insufficient documentation

## 2012-03-22 DIAGNOSIS — Y9389 Activity, other specified: Secondary | ICD-10-CM | POA: Insufficient documentation

## 2012-03-22 DIAGNOSIS — T2104XA Burn of unspecified degree of lower back, initial encounter: Secondary | ICD-10-CM | POA: Insufficient documentation

## 2012-03-22 MED ORDER — PERCOCET 5-325 MG PO TABS: 1.0000 | ORAL_TABLET | Freq: Four times a day (QID) | ORAL | Status: DC | PRN

## 2012-03-22 MED ORDER — OXYCODONE-ACETAMINOPHEN 5-325 MG PO TABS
2.0000 | ORAL_TABLET | Freq: Once | ORAL | Status: AC
  Administered 2012-03-22: 2 via ORAL
  Filled 2012-03-22: qty 2
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
## 2012-03-23 NOTE — ED Provider Notes (Signed)
Medical screening examination/treatment/procedure(s) were performed by non-physician practitioner and as supervising physician I was immediately available for consultation/collaboration.   [name], MD 03/23/12 [phone]

## 2012-09-22 ENCOUNTER — Emergency Department (HOSPITAL_COMMUNITY)
Admission: EM | Admit: 2012-09-22 | Discharge: 2012-09-22 | Disposition: A | Discharge status: Home / Self Care | Payer: Self-pay | Attending: Emergency Medicine | Admitting: Emergency Medicine

## 2012-09-22 ENCOUNTER — Encounter (HOSPITAL_COMMUNITY): Payer: Self-pay | Admitting: Emergency Medicine

## 2012-09-22 DIAGNOSIS — M549 Dorsalgia, unspecified: Secondary | ICD-10-CM

## 2012-09-22 DIAGNOSIS — G8929 Other chronic pain: Secondary | ICD-10-CM | POA: Insufficient documentation

## 2012-09-22 DIAGNOSIS — Z8739 Personal history of other diseases of the musculoskeletal system and connective tissue: Secondary | ICD-10-CM | POA: Insufficient documentation

## 2012-09-22 DIAGNOSIS — Z87442 Personal history of urinary calculi: Secondary | ICD-10-CM | POA: Insufficient documentation

## 2012-09-22 DIAGNOSIS — R209 Unspecified disturbances of skin sensation: Secondary | ICD-10-CM | POA: Insufficient documentation

## 2012-09-22 DIAGNOSIS — F17200 Nicotine dependence, unspecified, uncomplicated: Secondary | ICD-10-CM | POA: Insufficient documentation

## 2012-09-22 DIAGNOSIS — M545 Low back pain, unspecified: Secondary | ICD-10-CM | POA: Insufficient documentation

## 2012-09-22 HISTORY — DX: Sciatica, unspecified side: M54.30

## 2012-09-22 MED ORDER — OXYCODONE-ACETAMINOPHEN 7.5-325 MG PO TABS: 1.0000 | ORAL_TABLET | ORAL | Status: DC | PRN
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
## 2012-09-22 NOTE — ED Notes (Signed)
Pt c/o right side lower back pain radiating down r leg. Hx of sciatica. Has treatments scheduled. Has not taken anything for pain. Slight restlessness noted.
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
## 2012-10-04 NOTE — ED Provider Notes (Signed)
Medical screening examination/treatment/procedure(s) were performed by non-physician practitioner and as supervising physician I was immediately available for consultation/collaboration.  Shelda Jakes, MD 10/04/12 2352

## 2013-04-01 ENCOUNTER — Encounter (HOSPITAL_COMMUNITY): Payer: Self-pay | Admitting: Emergency Medicine

## 2013-04-01 ENCOUNTER — Emergency Department (HOSPITAL_COMMUNITY)
Admission: EM | Admit: 2013-04-01 | Discharge: 2013-04-01 | Disposition: A | Discharge status: Home / Self Care | Payer: Self-pay | Attending: Emergency Medicine | Admitting: Emergency Medicine

## 2013-04-01 DIAGNOSIS — M545 Low back pain, unspecified: Secondary | ICD-10-CM

## 2013-04-01 DIAGNOSIS — F17200 Nicotine dependence, unspecified, uncomplicated: Secondary | ICD-10-CM | POA: Insufficient documentation

## 2013-04-01 DIAGNOSIS — Y9389 Activity, other specified: Secondary | ICD-10-CM | POA: Insufficient documentation

## 2013-04-01 DIAGNOSIS — IMO0002 Reserved for concepts with insufficient information to code with codable children: Secondary | ICD-10-CM | POA: Insufficient documentation

## 2013-04-01 DIAGNOSIS — M5430 Sciatica, unspecified side: Secondary | ICD-10-CM

## 2013-04-01 DIAGNOSIS — Z87442 Personal history of urinary calculi: Secondary | ICD-10-CM | POA: Insufficient documentation

## 2013-04-01 DIAGNOSIS — X500XXA Overexertion from strenuous movement or load, initial encounter: Secondary | ICD-10-CM | POA: Insufficient documentation

## 2013-04-01 DIAGNOSIS — Y9289 Other specified places as the place of occurrence of the external cause: Secondary | ICD-10-CM | POA: Insufficient documentation

## 2013-04-01 MED ORDER — OXYCODONE-ACETAMINOPHEN 5-325 MG PO TABS
2.0000 | ORAL_TABLET | Freq: Once | ORAL | Status: AC
  Administered 2013-04-01: 2 via ORAL
  Filled 2013-04-01: qty 2

## 2013-04-01 MED ORDER — CYCLOBENZAPRINE HCL 10 MG PO TABS
10.0000 mg | ORAL_TABLET | Freq: Once | ORAL | Status: AC
  Administered 2013-04-01: 10 mg via ORAL
  Filled 2013-04-01: qty 1

## 2013-04-01 MED ORDER — OXYCODONE-ACETAMINOPHEN 5-325 MG PO TABS: 1.0000 | ORAL_TABLET | ORAL | Status: DC | PRN

## 2013-04-01 MED ORDER — NAPROXEN 500 MG PO TABS: 500.0000 mg | ORAL_TABLET | Freq: Two times a day (BID) | ORAL | Status: DC

## 2013-04-01 MED ORDER — CYCLOBENZAPRINE HCL 10 MG PO TABS: 10.0000 mg | ORAL_TABLET | Freq: Two times a day (BID) | ORAL | Status: DC | PRN

## 2013-04-01 NOTE — Discharge Instructions (Signed)
1. Medications: flexeril, naproxyn, percocet, usual home medications 2. Treatment: rest, drink plenty of fluids, gentle stretching as discussed, alternate ice and heat 3. Follow Up: Please followup with your primary doctor for discussion of your diagnoses and further evaluation after today's visit; if you do not have a primary care doctor use the resource guide provided to find one;  Also consider follow-up with the [facility] medicine clinic for further evaluation of your back pain   Back Exercises Back exercises help treat and prevent back injuries. The goal of back exercises is to increase the strength of your abdominal and back muscles and the flexibility of your back. These exercises should be started when you no longer have back pain. Back exercises include:  Pelvic Tilt. Lie on your back with your knees bent. Tilt your pelvis until the lower part of your back is against the floor. Hold this position 5 to 10 sec and repeat 5 to 10 times.  Knee to Chest. Pull first 1 knee up against your chest and hold for 20 to 30 seconds, repeat this with the other knee, and then both knees. This may be done with the other leg straight or bent, whichever feels better.  Sit-Ups or Curl-Ups. Bend your knees 90 degrees. Start with tilting your pelvis, and do a partial, slow sit-up, lifting your trunk only 30 to 45 degrees off the floor. Take at least 2 to 3 seconds for each sit-up. Do not do sit-ups with your knees out straight. If partial sit-ups are difficult, simply do the above but with only tightening your abdominal muscles and holding it as directed.  Hip-Lift. Lie on your back with your knees flexed 90 degrees. Push down with your feet and shoulders as you raise your hips a couple inches off the floor; hold for 10 seconds, repeat 5 to 10 times.  Back arches. Lie on your stomach, propping yourself up on bent elbows. Slowly press on your hands, causing an arch in your low back. Repeat 3 to 5 times. Any  initial stiffness and discomfort should lessen with repetition over time.  Shoulder-Lifts. Lie face down with arms beside your body. Keep hips and torso pressed to floor as you slowly lift your head and shoulders off the floor. Do not overdo your exercises, especially in the beginning. Exercises may cause you some mild back discomfort which lasts for a few minutes; however, if the pain is more severe, or lasts for more than 15 minutes, do not continue exercises until you see your caregiver. Improvement with exercise therapy for back problems is slow.  See your caregivers for assistance with developing a proper back exercise program. Document Released: 03/28/2004 Document Revised: 05/13/2011 Document Reviewed: 12/20/2010 [facility] Patient Information 2014 [place], [place].    Emergency Department Resource Guide 1) Find a Doctor and Pay Out of Pocket Although you won't have to find out who is covered by your insurance plan, it is a good idea to ask around and get recommendations. You will then need to call the office and see if the doctor you have chosen will accept you as a new patient and what types of options they offer for patients who are self-pay. Some doctors offer discounts or will set up payment plans for their patients who do not have insurance, but you will need to ask so you aren't surprised when you get to your appointment.  2) Contact Your Local Health Department Not all health departments have doctors that can see patients for sick visits,  but many do, so it is worth a call to see if yours does. If you don't know where your local health department is, you can check in your phone book. The CDC also has a tool to help you locate your state's health department, and many state websites also have listings of all of their local health departments.  3) Find a Walk-in Clinic If your illness is not likely to be very severe or complicated, you may want to try a walk in clinic. These are popping up  all over the country in pharmacies, drugstores, and shopping centers. They're usually staffed by nurse practitioners or physician assistants that have been trained to treat common illnesses and complaints. They're usually fairly quick and inexpensive. However, if you have serious medical issues or chronic medical problems, these are probably not your best option.  No Primary Care Doctor: - Call Health Connect at  [phone] - they can help you locate a primary care doctor that  accepts your insurance, provides certain services, etc. - Physician Referral Service- [phone]  Chronic Pain Problems: Organization         Address  Phone   Notes  [facility]  [phone] Patients need to be referred by their primary care doctor.   Medication Assistance: Organization         Address  Phone   Notes  [facility] Medication [facility] [address] [place], [place] [address] [phone] --Must be a resident of [facility] -- Must have NO insurance coverage whatsoever (no Medicaid/ Medicare, etc.) -- The pt. MUST have a primary care doctor that directs their care regularly and follows them in the community   Med[facility]ist  [phone]   [employer]  [phone]    Agencies that provide inexpensive medical care: Organization         Address  Phone   Notes  [facility]  [phone]   [facility]    [phone]   [facility] [address] [place], [place] [address] [phone]   [facility] [address] [place] [place] [phone]   Planned Parenthood    [phone]   [facility]    [phone]   [facility]  [address], [place] Phone:  [phone], Fax:  [phone] Hours of Operation:  9 am - 6 pm, M-F.  Also accepts Medicaid/Medicare and self-pay.  [facility]  [address], [place] Phone: [phone], Fax: [phone]. Hours of Operation:  8:30 am - 5:30 pm, M-F.  Also accepts Medicaid and self-pay.  [facility] [place] [address], [place] Phone: [phone]   [facility] [address] Natasha Bence [place], [place] [phone], Ext. [phone] Mondays & Thursdays: 7-9 AM.  First 15 patients are seen on a first come, first serve basis.    Medicaid-accepting [facility] Providers:  Organization         Address  Phone   Notes  [facility] [address], [place] [phone] Also accepts self-pay patients.  [facility] [address] Laurell Josephs [place], [place]  [phone]   [facility] [address], [place] [phone]   [facility] [address] [place] [phone]  Lucianne Lei [address], [place]   [phone] Only accepts [place] patients after they have their name applied to their card.   Self-Pay (no insurance) in [facility]:  Organization         Address  Phone   Notes  Sickle Cell Patients, [facility] [place] [phone]   [facility] [place] [phone]   [facility] [place]  [place], [place], [place] [phone]   [facility]/Dr. Osei-Bonsu  [address], [place] [address], [place] [phone] Phone number for both [place] and [place] locations is the same.  Urgent Medical and [facility] [address], [place] [phone]   [facility] [address], [place] or [address] [phone]   [facility] [address], [place] [phone], phone; [phone], fax Sees patients 1st and 3rd Saturday of every  month.  Must not qualify for public or private insurance (i.e. Medicaid, Medicare, [place] Health Choice, Veterans' Benefits)  Household income should be no more than 200% of the poverty level The clinic cannot treat you if you are pregnant or think you are pregnant  Sexually transmitted diseases are not treated at the clinic.    Dental Care: Organization         Address  Phone  Notes  [facility] of [facility] [place] [phone] Accepts children up to age 28 who are enrolled in [place] or [place]; pregnant women with a Medicaid card; and children who have applied for Medicaid or [place] Health Choice, but were declined, whose parents can pay a reduced fee at time of service.  [facility] Department of [facility]  [address], [place] [phone] Accepts children up to age 26 who are enrolled in [place] or [place]; pregnant women with a Medicaid card; and children who have applied for Medicaid or [place] Health Choice, but were declined, whose parents can pay a reduced fee at time of service.  [place] Adult Dental Access PROGRAM  [place] [phone] Patients are seen by appointment only. Walk-ins are not accepted. [place] will see patients 63 years of age and older. Monday - Tuesday (8am-5pm) Most Wednesdays (8:30-5pm) $30 per visit, cash only  [facility] Adult Dental Access PROGRAM  [address], [facility] [phone] Patients are seen by appointment only. Walk-ins are not accepted. [place] will see patients 68 years of age and older. One Wednesday Evening (Monthly: Volunteer Based).  $30 per visit, cash only  [place]  [phone] for adults; Children under age 62, call Graduate Pediatric Dentistry at [phone]. Children aged 70-14, please call [phone] to request a pediatric application.  Dental  services are provided in all areas of dental care including fillings, crowns and bridges, complete and partial dentures, implants, gum treatment, root canals, and extractions. Preventive care is also provided. Treatment is provided to both adults and children. Patients are selected via a lottery and there is often a waiting list.   [facility] [address] [place]  [phone] [email or web address], [place], [place] [phone], Ext.  123 Second and Fourth Thursday of each month, opens at 6:30 AM; Clinic ends at 9 AM.  Patients are seen on a first-come first-served basis, and a limited number are seen during each clinic.   [facility]  [address] Hillard Danker [place], [place] [phone]   Eligibility Requirements You must have lived in [place], [place], or [place] counties for at least the last three months.   You cannot be eligible for state or federal sponsored [employer], including [employer], [place], or [employer].   You generally cannot be eligible for healthcare insurance through your employer.    How to apply: Eligibility screenings are held every Tuesday and Wednesday afternoon from 1:00 pm until 4:00 pm. You do not need an appointment for the interview!  [facility] [address], [place], [place]   [place]  [place] Department  [place]  [phone]    Behavioral Health Resources in the Community: Intensive Outpatient Programs Organization         Address  Phone  Notes  [place]. [address], [place], [place] [phone]   [facility] [address], [place], [place]   ADS: [facility] [address] [place], [place]   [place] [address],    [place], [place] or [phone]   Substance Abuse Resources Organization         Address  Phone  Notes  Al[facility] Drug Services  [phone]   [place]  [phone]   The [place]   Chinita Pester  [phone]   Residential & Outpatient Substance Abuse Program  [phone]   Psychological Services Organization         Address  Phone  Notes  [facility] [place]  [place]  [phone]   [place] [address] [place] or [phone]    Mobile Crisis Teams Organization         Address  Phone  Notes  [facility], Mobile Crisis Care Unit  [phone]   [facility]  [address] [place], [place]   [place] [address], [place] [phone]    Self-Help/Support Groups Organization         Address  Phone             Notes  [place] of [place] - variety of support groups  [place] Call for more information  Narcotics Anonymous (NA), [facility] [address], [employer] Luis M. [place]  2 meetings at this location   Special educational needs teacher         Address  Phone  Notes  ASAP Residential Treatment [place],    [place]  [phone]   [facility]  [address] [place] 623762, [place], [place]   [place], [place] [phone] Admissions: 8am-3pm M-F  Incentives Substance [place] [address]    [place], [place] [phone]   [facility] [address] Jadene Pierini [place], [place]   The [place].,  [place], [place] - Intensive Outpatient [place] Kristeen [address], [place], [place]   [facility] ([place]  [facility] [address]  [place], [place]  [phone] or [phone]   [facility] ([facility]) [address] [place], [place] [phone] Accepts Medicaid  [facility] [address]  [place] [phone] Substance Abuse/Addiction Treatment   [facility] Behavioral Health Resources Organization         Address  Phone  Notes  [facility]  [phone]) [phone]   Angie FavaJulie Brannon, PhD [address] [place], [place]   [phone]) [phone] or [phone]) [phone]   [facility] [place]   [address] [place], [place] [phone]   [facility] [address], [place], [place] [phone]) [phone] Insurance/Medicaid/sponsorship through [facility]  [facility] Families [address]                                    [place], [place] [phone] Therapy/tele-psych/case  [facility] [address]   [place], [place] [phone]) [phone]    Dr. Lolly MustacheArfeen  [phone]) [phone]   [facility]  [employer] [facility] 1) [address], [place] 2) [address], [place] 3)  [address], [place] [phone]) [phone]  [phone](336) [phone]   [facility] Child Abuse Hotline [phone](336) [phone] or [phone]) [phone] (After Hours)

## 2013-04-01 NOTE — ED Notes (Addendum)
Presents with right lower back pain running down right leg. Movement and work makes pain worse, nothing makes pain better. Reports numbness to toes and tingling sensation in back of upper leg, CMS intact, dnies loss of bladder and bowel. Pain began again after working in yard yesterday.

## 2013-04-01 NOTE — ED Provider Notes (Signed)
CSN: [id number]     Arrival date & time 04/01/13  1515 History   First MD Initiated Contact with Patient 04/01/13 1530     Chief Complaint  Patient presents with  . Back Pain   (Consider location/radiation/quality/duration/timing/severity/associated sxs/prior Treatment) Patient is a 48 y.o. male presenting with back pain. The history is provided by the patient and medical records. No language interpreter was used.  Back Pain Associated symptoms: no abdominal pain, no chest pain, no dysuria, no fever, no headaches, no numbness and no weakness     Glen PERRELLI Sr. is a 48 y.o. male  with a hx of sciatica, kidney stones presents to the Emergency Department complaining of gradual, persistent, progressively worsening right sided lower back pain onset yesterday several hours after working in the yard and moving heavy items. Associated symptoms include radiation of pain into the posterior right upper leg with some tingling sensation.  Pt reports that he has had this intermittently in the past for which he has been treated with percocet and muscle relaxer.  Pt has not tried any OTC medications or treatment.  Nothing makes it better and movement makes it worse.  Pt denies fever, chills, headache, neck pain, chest pain, SOB, abd pain, N/V/D, weakness, dizziness, syncope, loss of bowel or bladder control, difficulty walking, fall, numbness.      Past Medical History  Diagnosis Date  . Renal disorder     kidney stones  . Back pain   . Sciatica    Past Surgical History  Procedure Laterality Date  . Eye surgery    . Appendectomy     Family History  Problem Relation Age of Onset  . Diabetes Father     deceased  . Cancer Mother     breast  . Heart failure Mother     pacemaker  . Heart failure Father    History  Substance Use Topics  . Smoking status: Current Every Day Smoker -- 1.00 packs/day    Types: Cigarettes  . Smokeless tobacco: Never Used  . Alcohol Use: No    Review of Systems   Constitutional: Negative for fever and fatigue.  Respiratory: Negative for chest tightness and shortness of breath.   Cardiovascular: Negative for chest pain.  Gastrointestinal: Negative for nausea, vomiting, abdominal pain and diarrhea.  Genitourinary: Negative for dysuria, urgency, frequency and hematuria.  Musculoskeletal: Positive for back pain. Negative for gait problem, joint swelling, neck pain and neck stiffness.  Skin: Negative for rash.  Neurological: Negative for weakness, light-headedness, numbness and headaches.  All other systems reviewed and are negative.    Allergies  Sulfa antibiotics and Zz system cleanup for nka flag  Home Medications   Current Outpatient Rx  Name  Route  Sig  Dispense  Refill  . Polyvinyl Alcohol (LIQUID TEARS OP)   Right Eye   Place 3 drops into the right eye daily as needed (dryness).         . cyclobenzaprine (FLEXERIL) 10 MG tablet   Oral   Take 1 tablet (10 mg total) by mouth 2 (two) times daily as needed for muscle spasms.   20 tablet   0   . naproxen (NAPROSYN) 500 MG tablet   Oral   Take 1 tablet (500 mg total) by mouth 2 (two) times daily with a meal.   30 tablet   0   . oxyCODONE-acetaminophen (PERCOCET/ROXICET) 5-325 MG per tablet   Oral   Take 1-2 tablets by mouth every 4 (four)  hours as needed for severe pain.   20 tablet   0    BP 109/83  Pulse 101  Temp(Src) 97.5 F (36.4 C) (Oral)  Resp 24  Ht 5\' 8"  (1.727 m)  Wt 210 lb (95.255 kg)  BMI 31.94 kg/m2  SpO2 98% Physical Exam  Nursing note and vitals reviewed. Constitutional: He is oriented to person, place, and time. He appears well-developed and well-nourished. No distress.  HENT:  Head: Normocephalic and atraumatic.  Mouth/Throat: Oropharynx is clear and moist. No oropharyngeal exudate.  Eyes: Conjunctivae are normal.  Neck: Normal range of motion. Neck supple.  Full ROM without pain  Cardiovascular: Normal rate, regular rhythm, normal heart sounds and  intact distal pulses.   No murmur heard. Pulmonary/Chest: Effort normal and breath sounds normal. No respiratory distress. He has no wheezes.  Abdominal: Soft. He exhibits no distension. There is no tenderness.  Musculoskeletal:  Full range of motion of the T-spine and L-spine No tenderness to palpation of the spinous processes of the T-spine or L-spine Mild tenderness to palpation of the right paraspinous muscles of the L-spine Pt with reproducible pain with palpation to the right buttock  Lymphadenopathy:    He has no cervical adenopathy.  Neurological: He is alert and oriented to person, place, and time. He has normal reflexes. He exhibits normal muscle tone. Coordination normal.  Speech is clear and goal oriented, follows commands Strength  5/5 in upper and lower extremities bilaterally including dorsiflexion and plantar flexion, strong and equal grip strength 5/5 flexion and extension of the great toe Sensation normal to light and sharp touch Moves extremities without ataxia, coordination intact Normal gait Normal balance  Skin: Skin is warm and dry. No rash noted. He is not diaphoretic. No erythema.  Psychiatric: He has a normal mood and affect. His behavior is normal.    ED Course  Procedures (including critical care time) Labs Review Labs Reviewed - No data to display Imaging Review No results found.  EKG Interpretation   None       MDM   1. Sciatica   2. Low back pain      Glen Austria Sr. presents with Hx and PE consistent with sciatica.  Normal neurological exam without weakness, no evidence of urinary incontinence or retention, pain is consistently reproducible. There is no evidence of AAA or concern for dissection at this time.  Patient ambulates with steady gait  but states it is painful.  No loss of bowel or bladder control.  No concern for cauda equina.  No fever, night sweats, weight loss, h/o cancer, IVDU.  Pain treated here in the department with  adequate improvement. RICE protocol and pain medicine indicated and discussed with patient. I have also discussed reasons to return immediately to the ER.  Patient expresses understanding and agrees with plan.  It has been determined that no acute conditions requiring further emergency intervention are present at this time. The patient/guardian have been advised of the diagnosis and plan. We have discussed signs and symptoms that warrant return to the ED, such as changes or worsening in symptoms.   Vital signs are stable at discharge.   BP 109/83  Pulse 101  Temp(Src) 97.5 F (36.4 C) (Oral)  Resp 24  Ht 5\' 8"  (1.727 m)  Wt 210 lb (95.255 kg)  BMI 31.94 kg/m2  SpO2 98%  Patient/guardian has voiced understanding and agreed to follow-up with the PCP or specialist.  Jarrett Soho Tyronda Vizcarrondo, PA-C 04/01/13 1617

## 2013-04-02 NOTE — ED Provider Notes (Signed)
Medical screening examination/treatment/procedure(s) were performed by non-physician practitioner and as supervising physician I was immediately available for consultation/collaboration.  EKG Interpretation   None        Virgel Manifold, MD 04/02/13 [phone]

## 2013-04-26 ENCOUNTER — Emergency Department (HOSPITAL_COMMUNITY)
Admission: EM | Admit: 2013-04-26 | Discharge: 2013-04-26 | Disposition: A | Discharge status: Home / Self Care | Payer: Self-pay | Attending: Emergency Medicine | Admitting: Emergency Medicine

## 2013-04-26 ENCOUNTER — Encounter (HOSPITAL_COMMUNITY): Payer: Self-pay | Admitting: Emergency Medicine

## 2013-04-26 ENCOUNTER — Emergency Department (HOSPITAL_COMMUNITY): Payer: Self-pay

## 2013-04-26 DIAGNOSIS — S59909A Unspecified injury of unspecified elbow, initial encounter: Secondary | ICD-10-CM | POA: Insufficient documentation

## 2013-04-26 DIAGNOSIS — Y939 Activity, unspecified: Secondary | ICD-10-CM | POA: Insufficient documentation

## 2013-04-26 DIAGNOSIS — Z791 Long term (current) use of non-steroidal anti-inflammatories (NSAID): Secondary | ICD-10-CM | POA: Insufficient documentation

## 2013-04-26 DIAGNOSIS — S6990XA Unspecified injury of unspecified wrist, hand and finger(s), initial encounter: Secondary | ICD-10-CM | POA: Insufficient documentation

## 2013-04-26 DIAGNOSIS — F17200 Nicotine dependence, unspecified, uncomplicated: Secondary | ICD-10-CM | POA: Insufficient documentation

## 2013-04-26 DIAGNOSIS — Z87442 Personal history of urinary calculi: Secondary | ICD-10-CM | POA: Insufficient documentation

## 2013-04-26 DIAGNOSIS — M5430 Sciatica, unspecified side: Secondary | ICD-10-CM | POA: Insufficient documentation

## 2013-04-26 DIAGNOSIS — Y929 Unspecified place or not applicable: Secondary | ICD-10-CM | POA: Insufficient documentation

## 2013-04-26 DIAGNOSIS — S59919A Unspecified injury of unspecified forearm, initial encounter: Secondary | ICD-10-CM

## 2013-04-26 DIAGNOSIS — W010XXA Fall on same level from slipping, tripping and stumbling without subsequent striking against object, initial encounter: Secondary | ICD-10-CM | POA: Insufficient documentation

## 2013-04-26 MED ORDER — OXYCODONE-ACETAMINOPHEN 5-325 MG PO TABS: 1.0000 | ORAL_TABLET | Freq: Four times a day (QID) | ORAL | Status: DC | PRN

## 2013-04-26 MED ORDER — DEXAMETHASONE SODIUM PHOSPHATE 10 MG/ML IJ SOLN
10.0000 mg | Freq: Once | INTRAMUSCULAR | Status: AC
  Administered 2013-04-26: 10 mg via INTRAMUSCULAR
  Filled 2013-04-26: qty 1

## 2013-04-26 NOTE — Discharge Instructions (Signed)
Sciatica °Sciatica is pain, weakness, numbness, or tingling along the path of the sciatic nerve. The nerve starts in the lower back and runs down the back of each leg. The nerve controls the muscles in the lower leg and in the back of the knee, while also providing sensation to the back of the thigh, lower leg, and the sole of your foot. Sciatica is a symptom of another medical condition. For instance, nerve damage or certain conditions, such as a herniated disk or bone spur on the spine, pinch or put pressure on the sciatic nerve. This causes the pain, weakness, or other sensations normally associated with sciatica. Generally, sciatica only affects one side of the body. °CAUSES  °· Herniated or slipped disc. °· Degenerative disk disease. °· A pain disorder involving the narrow muscle in the buttocks (piriformis syndrome). °· Pelvic injury or fracture. °· Pregnancy. °· Tumor (rare). °SYMPTOMS  °Symptoms can vary from mild to very severe. The symptoms usually travel from the low back to the buttocks and down the back of the leg. Symptoms can include: °· Mild tingling or dull aches in the lower back, leg, or hip. °· Numbness in the back of the calf or sole of the foot. °· Burning sensations in the lower back, leg, or hip. °· Sharp pains in the lower back, leg, or hip. °· Leg weakness. °· Severe back pain inhibiting movement. °These symptoms may get worse with coughing, sneezing, laughing, or prolonged sitting or standing. Also, being overweight may worsen symptoms. °DIAGNOSIS  °Your caregiver will perform a physical exam to look for common symptoms of sciatica. He or she may ask you to do certain movements or activities that would trigger sciatic nerve pain. Other tests may be performed to find the cause of the sciatica. These may include: °· Blood tests. °· X-rays. °· Imaging tests, such as an MRI or CT scan. °TREATMENT  °Treatment is directed at the cause of the sciatic pain. Sometimes, treatment is not necessary  and the pain and discomfort goes away on its own. If treatment is needed, your caregiver may suggest: °· Over-the-counter medicines to relieve pain. °· Prescription medicines, such as anti-inflammatory medicine, muscle relaxants, or narcotics. °· Applying heat or ice to the painful area. °· Steroid injections to lessen pain, irritation, and inflammation around the nerve. °· Reducing activity during periods of pain. °· Exercising and stretching to strengthen your abdomen and improve flexibility of your spine. Your caregiver may suggest losing weight if the extra weight makes the back pain worse. °· Physical therapy. °· Surgery to eliminate what is pressing or pinching the nerve, such as a bone spur or part of a herniated disk. °HOME CARE INSTRUCTIONS  °· Only take over-the-counter or prescription medicines for pain or discomfort as directed by your caregiver. °· Apply ice to the affected area for 20 minutes, 3 4 times a day for the first 48 72 hours. Then try heat in the same way. °· Exercise, stretch, or perform your usual activities if these do not aggravate your pain. °· Attend physical therapy sessions as directed by your caregiver. °· Keep all follow-up appointments as directed by your caregiver. °· Do not wear high heels or shoes that do not provide proper support. °· Check your mattress to see if it is too soft. A firm mattress may lessen your pain and discomfort. °SEEK IMMEDIATE MEDICAL CARE IF:  °· You lose control of your bowel or bladder (incontinence). °· You have increasing weakness in the lower back,   pelvis, buttocks, or legs. °· You have redness or swelling of your back. °· You have a burning sensation when you urinate. °· You have pain that gets worse when you lie down or awakens you at night. °· Your pain is worse than you have experienced in the past. °· Your pain is lasting longer than 4 weeks. °· You are suddenly losing weight without reason. °MAKE SURE YOU: °· Understand these  instructions. °· Will watch your condition. °· Will get help right away if you are not doing well or get worse. °Document Released: 02/12/2001 Document Revised: 08/20/2011 Document Reviewed: 06/30/2011 °[employer]® Patient Information ©2014 [employer]. ° °

## 2013-04-26 NOTE — ED Provider Notes (Signed)
CSN: [id number]     Arrival date & time 04/26/13  1437 History  This chart was scribed for non-physician practitioner, Montine Circle, PA-C working with Maudry Diego, MD by Frederich Balding, ED scribe. This patient was seen in room TR07C/TR07C and the patient's care was started at 3:46 PM.    Chief Complaint  Patient presents with  . Sciatica  . Fall   The history is provided by the patient. No language interpreter was used.   HPI Comments: Glen Mcmillan. is a 48 y.o. male who presents to the Emergency Department complaining of a fall that occurred yesterday. He slipped on ice and fell onto his right elbow. Pt has sudden onset right elbow pain and states his sciatica is also flaring up and has right lower back pain that radiates down his right leg. He tried to get an appointment with a PCP but there were none available until March 25 unless he was seen here and got a referral. Denies bowel or bladder incontinence.   Past Medical History  Diagnosis Date  . Renal disorder     kidney stones  . Back pain   . Sciatica    Past Surgical History  Procedure Laterality Date  . Eye surgery    . Appendectomy     Family History  Problem Relation Age of Onset  . Diabetes Father     deceased  . Cancer Mother     breast  . Heart failure Mother     pacemaker  . Heart failure Father    History  Substance Use Topics  . Smoking status: Current Every Day Smoker -- 1.00 packs/day    Types: Cigarettes  . Smokeless tobacco: Never Used  . Alcohol Use: No    Review of Systems  Constitutional: Negative for fever.  HENT: Negative for congestion.   Eyes: Negative for redness.  Respiratory: Negative for shortness of breath.   Genitourinary:       Negative for bowel or bladder incontinence.  Musculoskeletal: Positive for arthralgias, back pain and myalgias. Negative for gait problem.  Skin: Negative for rash.  Neurological: Negative for speech difficulty.  Psychiatric/Behavioral: Negative  for confusion.   Allergies  Sulfa antibiotics and Zz system cleanup for nka flag  Home Medications   Current Outpatient Rx  Name  Route  Sig  Dispense  Refill  . cyclobenzaprine (FLEXERIL) 10 MG tablet   Oral   Take 1 tablet (10 mg total) by mouth 2 (two) times daily as needed for muscle spasms.   20 tablet   0   . naproxen (NAPROSYN) 500 MG tablet   Oral   Take 1 tablet (500 mg total) by mouth 2 (two) times daily with a meal.   30 tablet   0   . oxyCODONE-acetaminophen (PERCOCET/ROXICET) 5-325 MG per tablet   Oral   Take 1-2 tablets by mouth every 4 (four) hours as needed for severe pain.   20 tablet   0   . Polyvinyl Alcohol (LIQUID TEARS OP)   Right Eye   Place 3 drops into the right eye daily as needed (dryness).          BP 109/48  Pulse 92  Temp(Src) 97.5 F (36.4 C) (Oral)  Resp 18  Ht 5\' 9"  (1.753 m)  Wt 201 lb 4.8 oz (91.309 kg)  BMI 29.71 kg/m2  SpO2 95%  Physical Exam  Nursing note and vitals reviewed. Constitutional: He is oriented to person, place, and time. He  appears well-developed. No distress.  HENT:  Head: Normocephalic and atraumatic.  Eyes: Conjunctivae and EOM are normal.  Cardiovascular: Normal rate and regular rhythm.   Pulmonary/Chest: Effort normal. No stridor. No respiratory distress.  Abdominal: He exhibits no distension.  Musculoskeletal: He exhibits no edema.  Neurological: He is alert and oriented to person, place, and time.  Skin: Skin is warm and dry.  Psychiatric: He has a normal mood and affect.    ED Course  Procedures (including critical care time)  DIAGNOSTIC STUDIES: Oxygen Saturation is 95% on RA, adequate by my interpretation.    COORDINATION OF CARE: 3:48 PM-Discussed treatment plan which includes a short course of pain medication with pt at bedside and pt agreed to plan.   No results found for this or any previous visit. Dg Elbow Complete Left  04/26/2013   CLINICAL DATA:  Fall with elbow pain.  EXAM: LEFT  ELBOW - COMPLETE 3+ VIEW  COMPARISON:  None.  FINDINGS: There is no evidence of fracture, dislocation, or joint effusion. There is no evidence of arthropathy or other focal bone abnormality. Soft tissues are unremarkable.  IMPRESSION: Negative.   Electronically Signed   By: Misty Stanley M.D.   On: 04/26/2013 16:19     EKG Interpretation   None       MDM   Final diagnoses:  Sciatica    Patient wearing bowling shoes, slipped on ice.  Complains of sciatica.  Patient with back pain.  No neurological deficits and normal neuro exam.  Patient can walk but states is painful.  No loss of bowel or bladder control.  No concern for cauda equina.  No fever, night sweats, weight loss, h/o cancer, IVDU.  RICE protocol and pain medicine indicated and discussed with patient.    I personally performed the services described in this documentation, which was scribed in my presence. The recorded information has been reviewed and is accurate.    Montine Circle, PA-C 04/26/13 1625

## 2013-04-26 NOTE — ED Provider Notes (Signed)
Medical screening examination/treatment/procedure(s) were performed by non-physician practitioner and as supervising physician I was immediately available for consultation/collaboration.  EKG Interpretation   None         Maudry Diego, MD 04/26/13 [phone]

## 2013-04-26 NOTE — ED Notes (Signed)
Pt reports sciatica, with pain down to right upper leg. Also fell yesterday and fell onto his left elbow. Pt is ambulatory can bear wt. Tried to get in with PCP but could not get an appointment until March unless seen here for referral. Pt is a x 4. In NAD

## 2014-08-09 IMAGING — CR DG ELBOW COMPLETE 3+V*L*
4 series · 4 of 4 positions shown · non-contrast
Comparison: None.

CLINICAL DATA: Fall with elbow pain.

EXAM:
LEFT ELBOW - COMPLETE 3+ VIEW

[x elbow joint ap left]
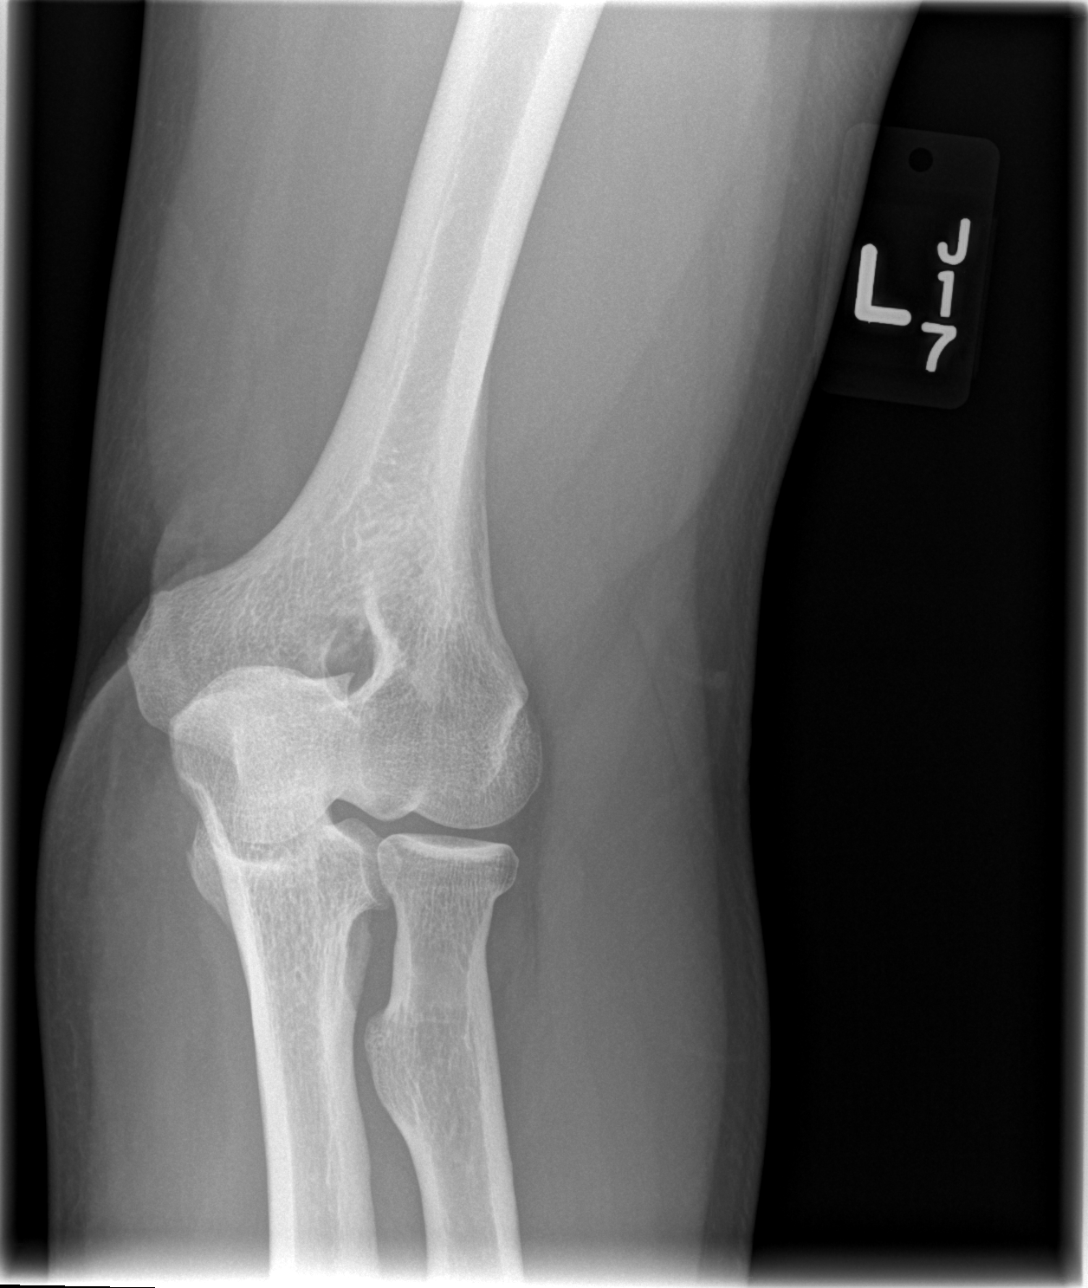

[x elbow joint obl. left (1 of 2)]
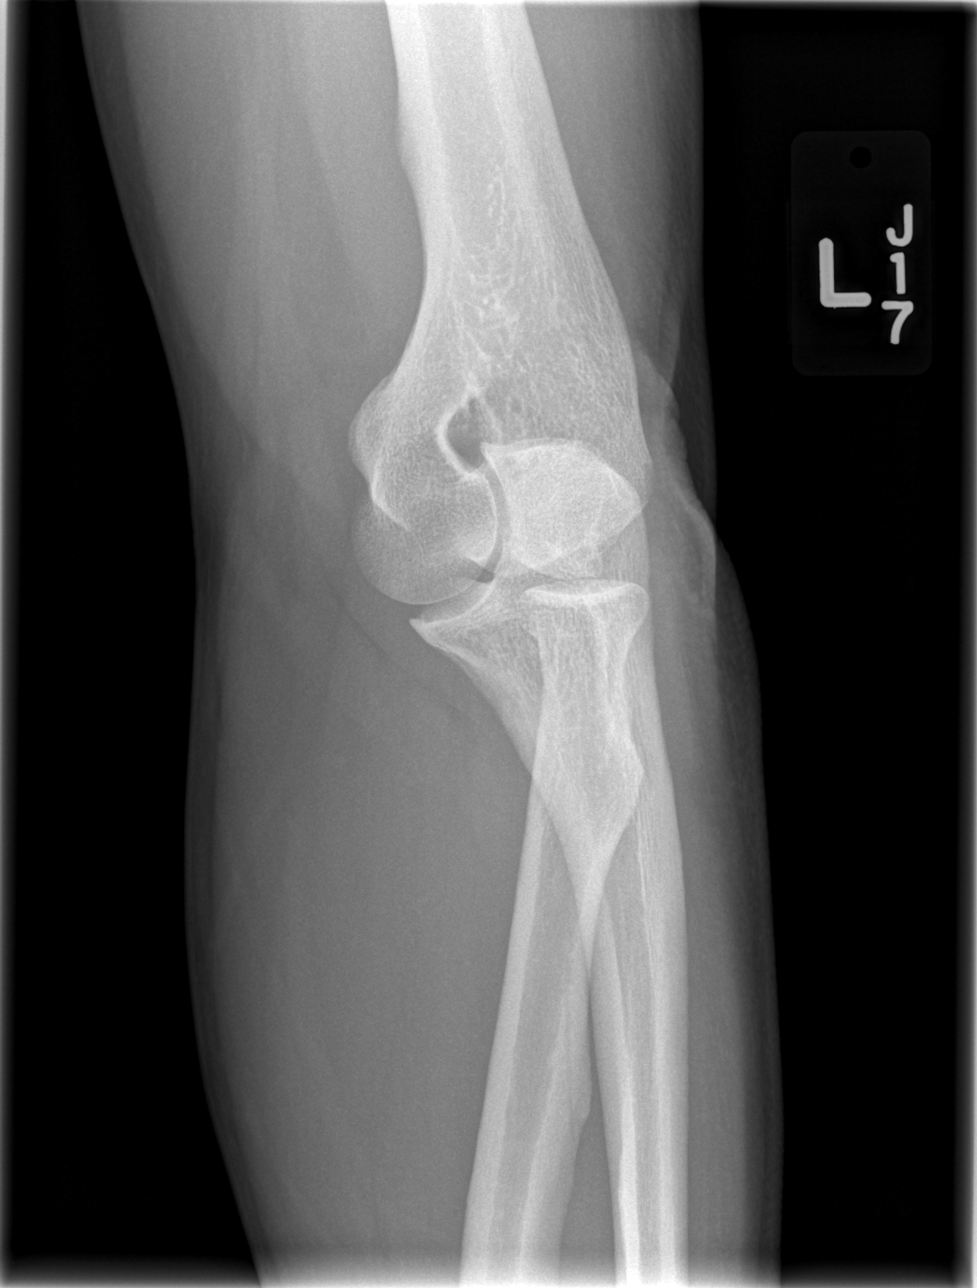

[x elbow joint obl. left (2 of 2)]
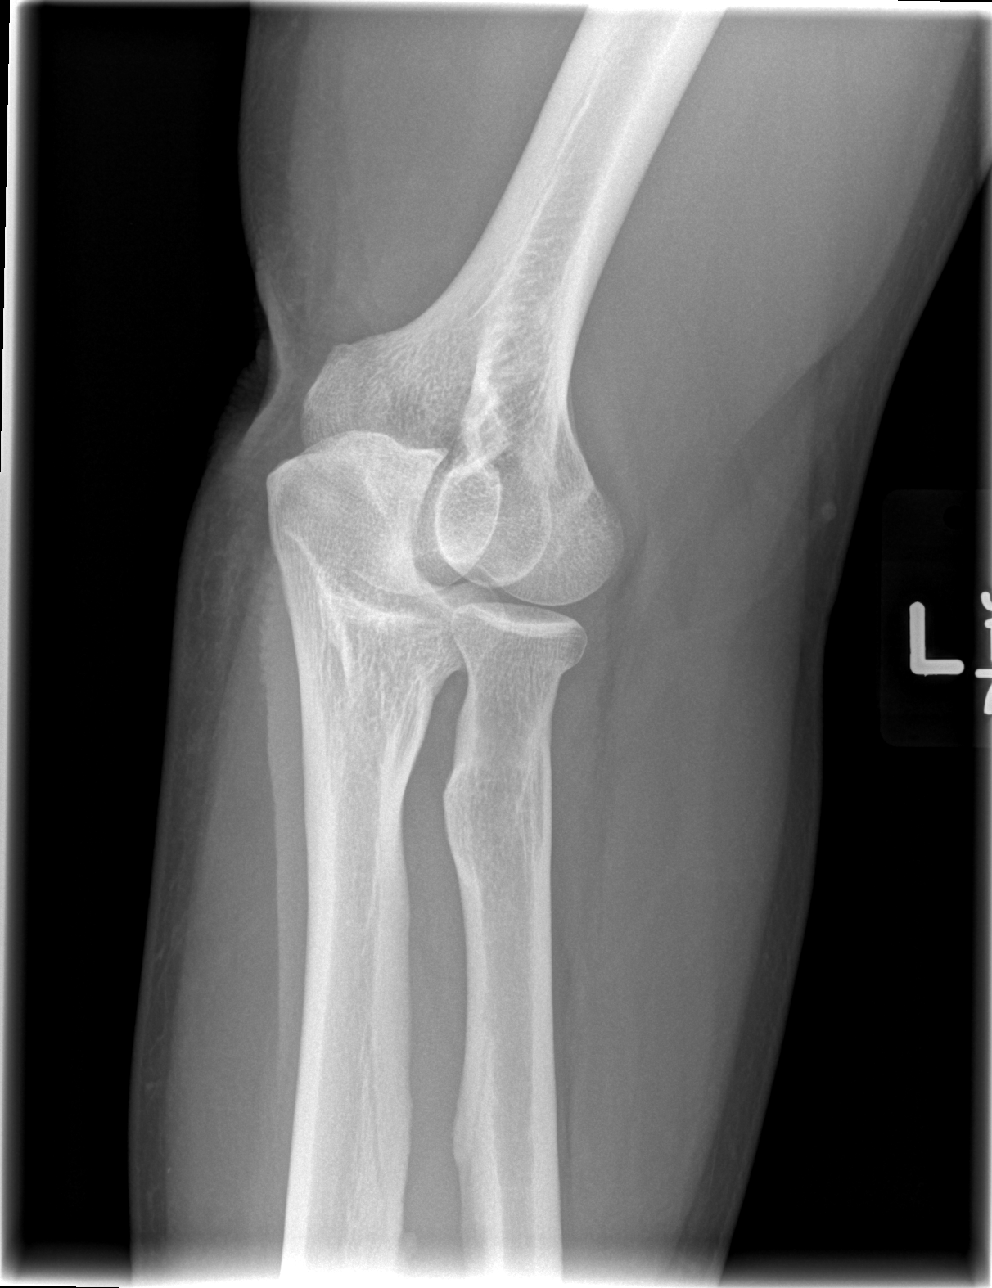

[x elbow joint lat left]
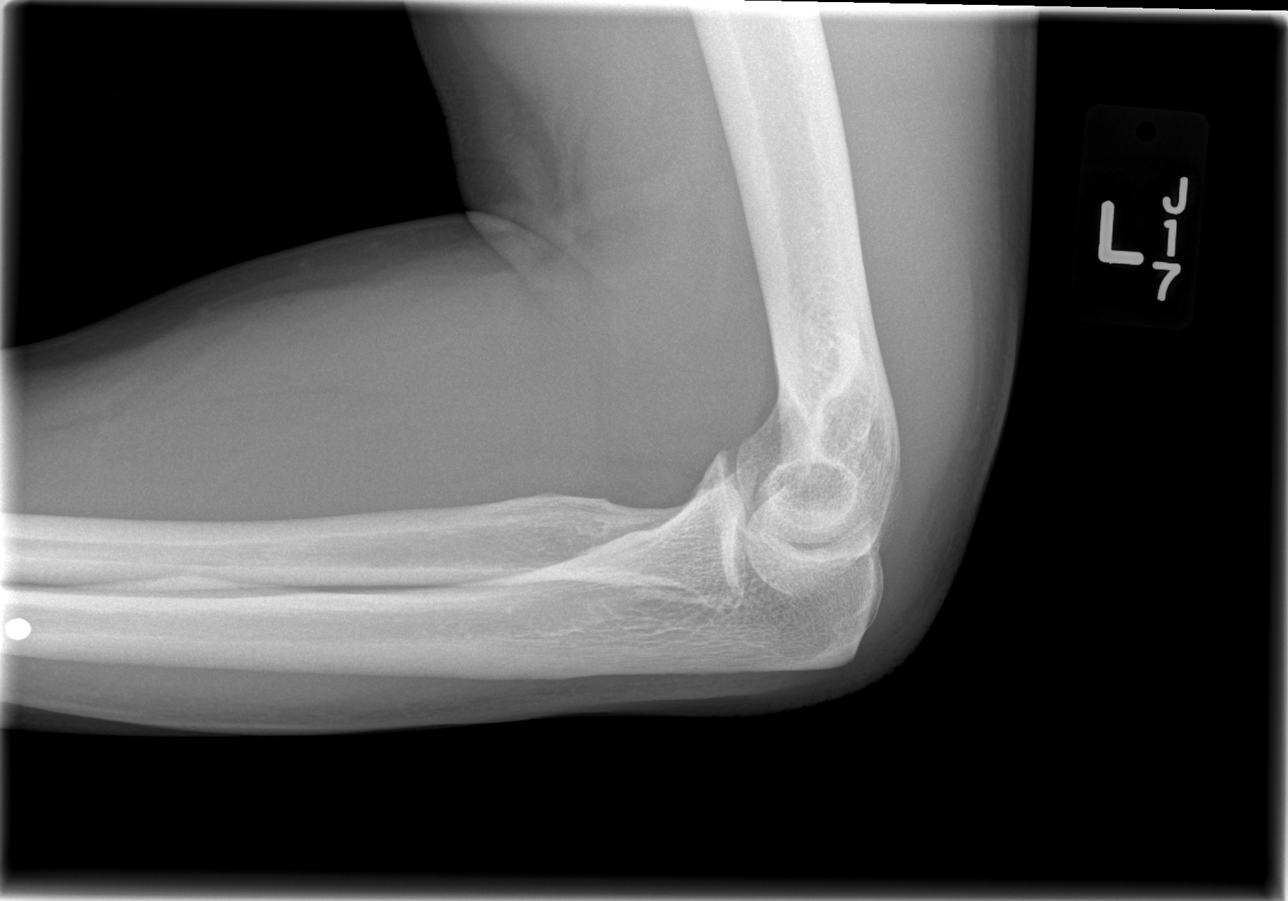

[4 of 4 positions shown; findings below may reference images not displayed]

FINDINGS: There is no evidence of fracture, dislocation, or joint effusion.
There is no evidence of arthropathy or other focal bone abnormality.
Soft tissues are unremarkable.
IMPRESSION: Negative.

## 2017-01-08 NOTE — Telephone Encounter (Signed)
done

## 2021-12-02 DIAGNOSIS — C661 Malignant neoplasm of right ureter: Secondary | ICD-10-CM

## 2021-12-02 HISTORY — DX: Malignant neoplasm of right ureter: C66.1

## 2021-12-08 ENCOUNTER — Encounter (HOSPITAL_COMMUNITY): Payer: Self-pay

## 2021-12-08 ENCOUNTER — Emergency Department (HOSPITAL_COMMUNITY): Payer: Self-pay

## 2021-12-08 ENCOUNTER — Other Ambulatory Visit: Payer: Self-pay

## 2021-12-08 ENCOUNTER — Emergency Department (HOSPITAL_COMMUNITY)
Admission: EM | Admit: 2021-12-08 | Discharge: 2021-12-08 | Disposition: A | Discharge status: Home / Self Care | Payer: Self-pay | Attending: Emergency Medicine | Admitting: Emergency Medicine

## 2021-12-08 DIAGNOSIS — F17200 Nicotine dependence, unspecified, uncomplicated: Secondary | ICD-10-CM | POA: Insufficient documentation

## 2021-12-08 DIAGNOSIS — R072 Precordial pain: Secondary | ICD-10-CM | POA: Insufficient documentation

## 2021-12-08 DIAGNOSIS — N132 Hydronephrosis with renal and ureteral calculous obstruction: Secondary | ICD-10-CM | POA: Insufficient documentation

## 2021-12-08 DIAGNOSIS — I2510 Atherosclerotic heart disease of native coronary artery without angina pectoris: Secondary | ICD-10-CM | POA: Insufficient documentation

## 2021-12-08 LAB — COMPREHENSIVE METABOLIC PANEL
ALT: 11 U/L (ref 0–44)
AST: 12 U/L — ABNORMAL LOW (ref 15–41)
Albumin: 3.8 g/dL (ref 3.5–5.0)
Alkaline Phosphatase: 52 U/L (ref 38–126)
Anion gap: 6 (ref 5–15)
BUN: 15 mg/dL (ref 6–20)
CO2: 27 mmol/L (ref 22–32)
Calcium: 8.4 mg/dL — ABNORMAL LOW (ref 8.9–10.3)
Chloride: 106 mmol/L (ref 98–111)
Creatinine, Ser: 0.85 mg/dL (ref 0.61–1.24)
GFR, Estimated: >60 mL/min (ref >60)
Glucose, Bld: 103 mg/dL — ABNORMAL HIGH (ref 70–99)
Potassium: 3.7 mmol/L (ref 3.5–5.1)
Sodium: 139 mmol/L (ref 135–145)
Total Bilirubin: 1.3 mg/dL — ABNORMAL HIGH (ref 0.3–1.2)
Total Protein: 6.7 g/dL (ref 6.5–8.1)

## 2021-12-08 LAB — CBC
HCT: 43.4 % (ref 39.0–52.0)
Hemoglobin: 14.4 g/dL (ref 13.0–17.0)
MCH: 30.8 pg (ref 26.0–34.0)
MCHC: 33.2 g/dL (ref 30.0–36.0)
MCV: 92.7 fL (ref 80.0–100.0)
Platelets: 172 10*3/uL (ref 150–400)
RBC: 4.68 MIL/uL (ref 4.22–5.81)
RDW: 13.6 % (ref 11.5–15.5)
WBC: 7.6 10*3/uL (ref 4.0–10.5)
nRBC: 0 % (ref 0.0–0.2)

## 2021-12-08 LAB — URINALYSIS, ROUTINE W REFLEX MICROSCOPIC
Bacteria, UA: NONE SEEN
Bilirubin Urine: NEGATIVE
Glucose, UA: NEGATIVE mg/dL
Ketones, ur: NEGATIVE mg/dL
Nitrite: NEGATIVE
Protein, ur: NEGATIVE mg/dL
Specific Gravity, Urine: 1.015 (ref 1.005–1.030)
pH: 5 (ref 5.0–8.0)

## 2021-12-08 LAB — TROPONIN I (HIGH SENSITIVITY): Troponin I (High Sensitivity): 12 ng/L (ref <18)

## 2021-12-08 LAB — LIPASE, BLOOD: Lipase: 25 U/L (ref 11–51)

## 2021-12-08 MED ORDER — ONDANSETRON HCL 4 MG/2ML IJ SOLN
4.0000 mg | Freq: Once | INTRAMUSCULAR | Status: AC
  Administered 2021-12-08: 4 mg via INTRAVENOUS
  Filled 2021-12-08: qty 2

## 2021-12-08 MED ORDER — OXYCODONE-ACETAMINOPHEN 5-325 MG PO TABS: 1.0000 | ORAL_TABLET | Freq: Four times a day (QID) | ORAL | 0 refills | Status: DC | PRN

## 2021-12-08 MED ORDER — ASPIRIN 81 MG PO CHEW
324.0000 mg | CHEWABLE_TABLET | Freq: Once | ORAL | Status: AC
  Administered 2021-12-08: 324 mg via ORAL
  Filled 2021-12-08: qty 4

## 2021-12-08 MED ORDER — SENNOSIDES-DOCUSATE SODIUM 8.6-50 MG PO TABS: 1.0000 | ORAL_TABLET | Freq: Every evening | ORAL | 0 refills | Status: DC | PRN

## 2021-12-08 MED ORDER — MORPHINE SULFATE (PF) 4 MG/ML IV SOLN
4.0000 mg | Freq: Once | INTRAVENOUS | Status: AC
  Administered 2021-12-08: 4 mg via INTRAVENOUS
  Filled 2021-12-08: qty 1

## 2021-12-08 MED ORDER — KETOROLAC TROMETHAMINE 30 MG/ML IJ SOLN
30.0000 mg | Freq: Once | INTRAMUSCULAR | Status: AC
  Administered 2021-12-08: 30 mg via INTRAVENOUS
  Filled 2021-12-08: qty 1

## 2021-12-08 NOTE — ED Provider Notes (Signed)
Emergency Department Provider Note   I have reviewed the triage vital signs and the nursing notes.   HISTORY  Chief Complaint Chest Pain and Abdominal Pain   HPI Glen WEATHERSPOON Sr. is a 56 y.o. male with PMH of tobacco use, kidney stones, and non-occlusive CAD presents to the emergency department with complaint of acute onset chest discomfort today and left back/flank pain for the past 2 days.  Patient states that his flank pain feels very similar to prior kidney stones which has had in the past.  He is having some difficulty with urination but has not noticed gross blood.  No fevers or chills.  He has had many prior stones and states this feels very similar.  He was attempting to wait this out at home but today developed acute onset chest heaviness.  He states "it felt like an elephant was sitting on my chest."  He briefly became diaphoretic and had some nausea but most of his symptoms have resolved at this point.  No shortness of breath.  No ripping/tearing pain in the chest or abdomen.  No numbness/weakness.  Past Medical History:  Diagnosis Date   Back pain    Renal disorder    kidney stones   Sciatica     Review of Systems  Constitutional: No fever/chills Eyes: No visual changes. ENT: No sore throat. Cardiovascular: Positive chest pain. Respiratory: Denies shortness of breath. Gastrointestinal: Positive left flank/abdominal pain. Positive nausea, no vomiting.  No diarrhea.  No constipation. Genitourinary: Negative for dysuria. Musculoskeletal: Positive for back pain. Skin: Negative for rash. Neurological: Negative for headaches, focal weakness or numbness.  ____________________________________________   PHYSICAL EXAM:  VITAL SIGNS: ED Triage Vitals  Enc Vitals Group     BP 12/08/21 1231 109/79     Pulse --      Resp 12/08/21 1231 19     Temp 12/08/21 1231 98.3 F (36.8 C)     Temp Source 12/08/21 1231 Oral     SpO2 12/08/21 1231 100 %     Weight 12/08/21  1256 185 lb (83.9 kg)     Height 12/08/21 1256 '5\' 8"'$  (1.727 m)   Constitutional: Alert and oriented. Well appearing and in no acute distress. Eyes: Conjunctivae are normal.  Head: Atraumatic. Nose: No congestion/rhinnorhea. Mouth/Throat: Mucous membranes are moist.   Neck: No stridor.   Cardiovascular: Normal rate, regular rhythm. Good peripheral circulation. 2+ radial pulses. Grossly normal heart sounds.   Respiratory: Normal respiratory effort.  No retractions. Lungs CTAB. Gastrointestinal: Soft and nontender. No distention.  Musculoskeletal: No lower extremity tenderness nor edema. No gross deformities of extremities. Neurologic:  Normal speech and language. No gross focal neurologic deficits are appreciated.  Skin:  Skin is warm, dry and intact. No rash noted.   ____________________________________________   LABS (all labs ordered are listed, but only abnormal results are displayed)  Labs Reviewed  COMPREHENSIVE METABOLIC PANEL - Abnormal; Notable for the following components:      Result Value   Glucose, Bld 103 (*)    Calcium 8.4 (*)    AST 12 (*)    Total Bilirubin 1.3 (*)    All other components within normal limits  URINE CULTURE  CBC  LIPASE, BLOOD  URINALYSIS, ROUTINE W REFLEX MICROSCOPIC  TROPONIN I (HIGH SENSITIVITY)  TROPONIN I (HIGH SENSITIVITY)   ____________________________________________  EKG   EKG Interpretation  Date/Time:  Saturday December 08 2021 12:39:10 EDT Ventricular Rate:  77 PR Interval:  156 QRS Duration: 97  QT Interval:  371 QTC Calculation: 420 R Axis:   36 Text Interpretation: Sinus rhythm No significant change since prior 3/04 Confirmed by Aletta Edouard [phone]) on 12/08/2021 12:54:17 PM        ____________________________________________  RADIOLOGY  CT Renal Stone Study  Result Date: 12/08/2021 CLINICAL DATA:  Left flank pain EXAM: CT ABDOMEN AND PELVIS WITHOUT CONTRAST TECHNIQUE: Multidetector CT imaging of the abdomen  and pelvis was performed following the standard protocol without IV contrast. RADIATION DOSE REDUCTION: This exam was performed according to the departmental dose-optimization program which includes automated exposure control, adjustment of the mA and/or kV according to patient size and/or use of iterative reconstruction technique. COMPARISON:  08/17/2009 FINDINGS: Lower chest: No acute abnormality Hepatobiliary: No focal hepatic abnormality. Gallbladder unremarkable. Pancreas: No focal abnormality or ductal dilatation. Spleen: No focal abnormality.  Normal size. Adrenals/Urinary Tract: Bilateral hydronephrosis. Left hydronephrosis is due to 6 mm proximal left ureteral stone. Right hydronephrosis extends to just above the pelvic brim. There is no visible calcified stone in this area. Slight soft tissue density appreciated. Cannot exclude obstructing urothelial lesion. Punctate nonobstructing stone in the upper pole of the right kidney. Adrenal glands and urinary bladder unremarkable. Stomach/Bowel: Left colonic diverticulosis. No active diverticulitis. Stomach and small bowel decompressed. Vascular/Lymphatic: Scattered aortic calcifications. No evidence of aneurysm or adenopathy. Reproductive: No visible focal abnormality. Other: No free fluid or free air. Small bilateral inguinal hernias containing fat. Musculoskeletal: No acute bony abnormality. 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 IMPRESSION: Obstructing 6 mm proximal left ureteral stone with mild left hydronephrosis. Right nephrolithiasis. Right hydronephrosis to the level of the pelvic brim. No visible obstructing stone in this area. Cannot exclude obstructing urothelial lesion. Recommend urologic consultation for further evaluation. Aortic atherosclerosis. Left colonic diverticulosis. Electronically Signed   By: Rolm Baptise M.D.   On: 12/08/2021 14:11   DG Chest 2 View  Result Date: 12/08/2021 CLINICAL DATA:  Chest pain EXAM: CHEST  - 2 VIEW COMPARISON:  None Available. FINDINGS: The heart size and mediastinal contours are within normal limits. Both lungs are clear. The visualized skeletal structures are unremarkable. IMPRESSION: No acute cardiopulmonary abnormality. Electronically Signed   By: Beryle Flock M.D.   On: 12/08/2021 13:21    ____________________________________________   PROCEDURES  Procedure(s) performed:   Procedures  None  ____________________________________________   INITIAL IMPRESSION / ASSESSMENT AND PLAN / ED COURSE  Pertinent labs & imaging results that were available during my care of the patient were reviewed by me and considered in my medical decision making (see chart for details).   This patient is Presenting for Evaluation of CP, which does require a range of treatment options, and is a complaint that involves a high risk of morbidity and mortality.  The Differential Diagnoses for CP includes but is not exclusive to acute coronary syndrome, aortic dissection, pulmonary embolism, cardiac tamponade, community-acquired pneumonia, pericarditis, musculoskeletal chest wall pain, etc.  Differential diagnosis for abdominal pain includes but is not exclusive to acute appendicitis, renal colic, testicular torsion, urinary tract infection, prostatitis,  diverticulitis, small bowel obstruction, colitis, abdominal aortic aneurysm, gastroenteritis, constipation etc.    Critical Interventions-    Medications  aspirin chewable tablet 324 mg (324 mg Oral Given 12/08/21 1302)  ketorolac (TORADOL) 30 MG/ML injection 30 mg (30 mg Intravenous Given 12/08/21 1356)  ondansetron (ZOFRAN) injection 4 mg (4 mg Intravenous Given 12/08/21 1356)  morphine (PF) 4 MG/ML injection 4 mg (4 mg Intravenous Given 12/08/21 1638)  Reassessment after intervention:  Pain well controlled.    I decided to review pertinent External Data, and in summary patient with left heart cath in 02/2021 in [place]. Report available  in care everywhere showing non-occlusive CAD.   Clinical Laboratory Tests Ordered, included   Radiologic Tests Ordered, included CXR and CT renal. I independently interpreted the images and agree with radiology interpretation.   Cardiac Monitor Tracing which shows NSR.   Social Determinants of Health Risk patient is a smoker.    Medical Decision Making: Summary:  Patient presents emergency department with left flank and abdominal discomfort which she reports is typical of prior ureteral stones.  Patient also with chest heaviness.  Nonocclusive CAD on cath in 02/2021. Plan for serial troponin and CT renal. Considered unifying diagnosis of dissection but chest pain and abdominal pain have very different qualities.  Patient with equal pulses.  Plan to start with CT renal and if negative for ureteral stone would consider further, possibly vascular, imaging.   Reevaluation with update and discussion with patient he is very anxious to leave.  His pain is improved both in the chest and abdomen.  His urine is pending and repeat troponin is also pending.  He tells me that he cannot drive after dark and has family coming to get him but they can also not drive after dark and must leave.  He understands that he is leaving prior to completing his emergency department work-up and will be leaving [place].  We discussed that he is free to return at any time and I have placed referrals both to cardiology and urology to help with outpatient follow-up.  I have also sent medicines to the pharmacy to help with symptoms.  He states that he is free to return at any time.   Disposition: AMA  ____________________________________________  FINAL CLINICAL IMPRESSION(S) / ED DIAGNOSES  Final diagnoses:  Ureteral stone with hydronephrosis  Precordial chest pain     NEW OUTPATIENT MEDICATIONS STARTED DURING THIS VISIT:  New Prescriptions   OXYCODONE-ACETAMINOPHEN (PERCOCET/ROXICET) 5-325 MG TABLET     Take 1 tablet by mouth every 6 (six) hours as needed for severe pain.   SENNA-DOCUSATE (SENOKOT-S) 8.6-50 MG TABLET    Take 1 tablet by mouth at bedtime as needed for mild constipation.    Note:  This document was prepared using Dragon voice recognition software and may include unintentional dictation errors.  Nanda Quinton, MD, [facility] Emergency Medicine    Omar Gayden, [facility], MD 12/08/21 [phone]

## 2021-12-08 NOTE — ED Provider Triage Note (Signed)
Emergency Medicine Provider Triage Evaluation Note  Glen Austria Sr. , a 56 y.o. male  was evaluated in triage.  Pt complains of chest pain.  Patient says he had left-sided sharp abdominal pain last night.  That has persisted to today.  Patient also states he has been unable to urinate since 8 PM last night.  This is not normal for him.  Main complaint is chest pain.  States he got sharp sudden left-sided chest pain approximately 1 hour prior to arrival that radiated to the left shoulder with left arm numbness.  Sharp pain resolved but now he has an aching and heavy feeling in his chest.  Has never had symptoms like this previously.  Has an extensive family history of cardiac disease.  States he just recently found out he has 4 maxed out credit cards and has been very anxious about this  Review of Systems  Positive: As above Negative: Shortness of breath, nausea, vomiting  Physical Exam  BP 109/79 (BP Location: Left Arm)   Temp 98.3 F (36.8 C) (Oral)   Resp 19   SpO2 100%  Gen:   Awake, no distress   Resp:  Normal effort  MSK:   Moves extremities without difficulty  Other:    Medical Decision Making  Medically screening exam initiated at 12:44 PM.  Appropriate orders placed.  Glen Austria Sr. was informed that the remainder of the evaluation will be completed by another provider, this initial triage assessment does not replace that evaluation, and the importance of remaining in the ED until their evaluation is complete.     Roylene Reason, [place] 12/08/21 1246

## 2021-12-08 NOTE — ED Triage Notes (Signed)
Patient reports that he began having left chest pain and SOB x 1 hour ago.  Patient also c/o left abdominal pain since 0230 today. Patient states he vomited x 1 only today.

## 2021-12-08 NOTE — Discharge Instructions (Signed)
You were seen in the emergency department today with chest discomfort and abdominal pain.  You do have a kidney stone on the left but also some evidence of obstruction on the right.  I was hoping to wait on your urine to make sure there was no infection and coordinate follow-up with the urologist but you are leaving [employer].  If you change your mind you can return at any time.  I have placed referrals and contact information here for both urology and cardiology.  I have called in some medicines to your pharmacy to help with symptoms.  If you develop any new or suddenly worsening symptoms such as worsening chest pain, fever, worsening abdominal or flank pain please return to the emergency department immediately for reevaluation.

## 2021-12-09 ENCOUNTER — Other Ambulatory Visit: Payer: Self-pay

## 2021-12-09 ENCOUNTER — Inpatient Hospital Stay (HOSPITAL_COMMUNITY)
Admission: EM | Admit: 2021-12-09 | Discharge: 2021-12-12 | DRG: 657 | Disposition: A | Discharge status: Home / Self Care | Payer: Self-pay | Attending: Internal Medicine | Admitting: Internal Medicine

## 2021-12-09 ENCOUNTER — Encounter (HOSPITAL_COMMUNITY): Payer: Self-pay

## 2021-12-09 DIAGNOSIS — N132 Hydronephrosis with renal and ureteral calculous obstruction: Secondary | ICD-10-CM | POA: Diagnosis present

## 2021-12-09 DIAGNOSIS — N1330 Unspecified hydronephrosis: Secondary | ICD-10-CM | POA: Diagnosis present

## 2021-12-09 DIAGNOSIS — Z803 Family history of malignant neoplasm of breast: Secondary | ICD-10-CM

## 2021-12-09 DIAGNOSIS — Z881 Allergy status to other antibiotic agents status: Secondary | ICD-10-CM

## 2021-12-09 DIAGNOSIS — N201 Calculus of ureter: Principal | ICD-10-CM

## 2021-12-09 DIAGNOSIS — Z87442 Personal history of urinary calculi: Secondary | ICD-10-CM

## 2021-12-09 DIAGNOSIS — Z79899 Other long term (current) drug therapy: Secondary | ICD-10-CM

## 2021-12-09 DIAGNOSIS — E876 Hypokalemia: Secondary | ICD-10-CM | POA: Diagnosis present

## 2021-12-09 DIAGNOSIS — N209 Urinary calculus, unspecified: Secondary | ICD-10-CM | POA: Diagnosis present

## 2021-12-09 DIAGNOSIS — F17210 Nicotine dependence, cigarettes, uncomplicated: Secondary | ICD-10-CM | POA: Diagnosis present

## 2021-12-09 DIAGNOSIS — Z833 Family history of diabetes mellitus: Secondary | ICD-10-CM

## 2021-12-09 DIAGNOSIS — D696 Thrombocytopenia, unspecified: Secondary | ICD-10-CM | POA: Diagnosis present

## 2021-12-09 DIAGNOSIS — Z8249 Family history of ischemic heart disease and other diseases of the circulatory system: Secondary | ICD-10-CM

## 2021-12-09 DIAGNOSIS — N23 Unspecified renal colic: Secondary | ICD-10-CM | POA: Diagnosis present

## 2021-12-09 DIAGNOSIS — N2889 Other specified disorders of kidney and ureter: Secondary | ICD-10-CM | POA: Diagnosis present

## 2021-12-09 DIAGNOSIS — Z720 Tobacco use: Secondary | ICD-10-CM | POA: Diagnosis present

## 2021-12-09 DIAGNOSIS — N200 Calculus of kidney: Secondary | ICD-10-CM

## 2021-12-09 DIAGNOSIS — H548 Legal blindness, as defined in USA: Secondary | ICD-10-CM | POA: Diagnosis present

## 2021-12-09 DIAGNOSIS — D649 Anemia, unspecified: Secondary | ICD-10-CM | POA: Diagnosis present

## 2021-12-09 DIAGNOSIS — C661 Malignant neoplasm of right ureter: Principal | ICD-10-CM | POA: Diagnosis present

## 2021-12-09 LAB — CBC WITH DIFFERENTIAL/PLATELET
Abs Immature Granulocytes: 0.03 10*3/uL (ref 0.00–0.07)
Basophils Absolute: 0.1 10*3/uL (ref 0.0–0.1)
Basophils Relative: 1 %
Eosinophils Absolute: 0.1 10*3/uL (ref 0.0–0.5)
Eosinophils Relative: 1 %
HCT: 40.4 % (ref 39.0–52.0)
Hemoglobin: 13.6 g/dL (ref 13.0–17.0)
Immature Granulocytes: 0 %
Lymphocytes Relative: 39 %
Lymphs Abs: 3.5 10*3/uL (ref 0.7–4.0)
MCH: 31.1 pg (ref 26.0–34.0)
MCHC: 33.7 g/dL (ref 30.0–36.0)
MCV: 92.2 fL (ref 80.0–100.0)
Monocytes Absolute: 0.9 10*3/uL (ref 0.1–1.0)
Monocytes Relative: 10 %
Neutro Abs: 4.5 10*3/uL (ref 1.7–7.7)
Neutrophils Relative %: 49 %
Platelets: 170 10*3/uL (ref 150–400)
RBC: 4.38 MIL/uL (ref 4.22–5.81)
RDW: 13.5 % (ref 11.5–15.5)
WBC: 9.1 10*3/uL (ref 4.0–10.5)
nRBC: 0 % (ref 0.0–0.2)

## 2021-12-09 LAB — BASIC METABOLIC PANEL
Anion gap: 7 (ref 5–15)
BUN: 18 mg/dL (ref 6–20)
CO2: 27 mmol/L (ref 22–32)
Calcium: 7.6 mg/dL — ABNORMAL LOW (ref 8.9–10.3)
Chloride: 108 mmol/L (ref 98–111)
Creatinine, Ser: 0.98 mg/dL (ref 0.61–1.24)
GFR, Estimated: >60 mL/min (ref >60)
Glucose, Bld: 56 mg/dL — ABNORMAL LOW (ref 70–99)
Potassium: 3.1 mmol/L — ABNORMAL LOW (ref 3.5–5.1)
Sodium: 142 mmol/L (ref 135–145)

## 2021-12-09 LAB — TROPONIN I (HIGH SENSITIVITY): Troponin I (High Sensitivity): 2 ng/L (ref <18)

## 2021-12-09 MED ORDER — ONDANSETRON HCL 4 MG/2ML IJ SOLN
4.0000 mg | Freq: Once | INTRAMUSCULAR | Status: AC
  Administered 2021-12-09: 4 mg via INTRAVENOUS
  Filled 2021-12-09: qty 2

## 2021-12-09 MED ORDER — ONDANSETRON HCL 4 MG PO TABS: 4.0000 mg | ORAL_TABLET | Freq: Four times a day (QID) | ORAL | Status: DC | PRN

## 2021-12-09 MED ORDER — ENOXAPARIN SODIUM 40 MG/0.4ML IJ SOSY: 40.0000 mg | PREFILLED_SYRINGE | INTRAMUSCULAR | Status: DC

## 2021-12-09 MED ORDER — HYDROMORPHONE HCL 1 MG/ML IJ SOLN
1.0000 mg | Freq: Once | INTRAMUSCULAR | Status: AC
  Administered 2021-12-09: 1 mg via INTRAVENOUS
  Filled 2021-12-09: qty 1

## 2021-12-09 MED ORDER — LACTATED RINGERS IV SOLN: INTRAVENOUS | Status: DC

## 2021-12-09 MED ORDER — SODIUM CHLORIDE 0.9 % IV BOLUS
500.0000 mL | Freq: Once | INTRAVENOUS | Status: AC
  Administered 2021-12-09: 500 mL via INTRAVENOUS

## 2021-12-09 MED ORDER — NICOTINE 21 MG/24HR TD PT24
21.0000 mg | MEDICATED_PATCH | Freq: Every day | TRANSDERMAL | Status: DC
  Administered 2021-12-09 – 2021-12-11 (×3): 21 mg via TRANSDERMAL
  Filled 2021-12-09 (×4): qty 1

## 2021-12-09 MED ORDER — POTASSIUM CHLORIDE CRYS ER 20 MEQ PO TBCR
40.0000 meq | EXTENDED_RELEASE_TABLET | Freq: Three times a day (TID) | ORAL | Status: AC
  Administered 2021-12-09 (×2): 40 meq via ORAL
  Filled 2021-12-09 (×2): qty 2

## 2021-12-09 MED ORDER — ONDANSETRON HCL 4 MG/2ML IJ SOLN: 4.0000 mg | Freq: Four times a day (QID) | INTRAMUSCULAR | Status: DC | PRN

## 2021-12-09 MED ORDER — DEXTROSE 50 % IV SOLN: 25.0000 g | INTRAVENOUS | Status: DC | PRN

## 2021-12-09 MED ORDER — ACETAMINOPHEN 325 MG PO TABS: 650.0000 mg | ORAL_TABLET | Freq: Four times a day (QID) | ORAL | Status: DC | PRN

## 2021-12-09 MED ORDER — HYDROMORPHONE HCL 1 MG/ML IJ SOLN
1.0000 mg | INTRAMUSCULAR | Status: AC | PRN
  Administered 2021-12-10 – 2021-12-11 (×2): 1 mg via INTRAVENOUS
  Filled 2021-12-09 (×2): qty 1

## 2021-12-09 MED ORDER — LACTATED RINGERS IV BOLUS
1000.0000 mL | Freq: Once | INTRAVENOUS | Status: AC
  Administered 2021-12-09: 1000 mL via INTRAVENOUS

## 2021-12-09 MED ORDER — TAMSULOSIN HCL 0.4 MG PO CAPS
0.4000 mg | ORAL_CAPSULE | Freq: Every day | ORAL | Status: DC
  Administered 2021-12-09 – 2021-12-12 (×4): 0.4 mg via ORAL
  Filled 2021-12-09 (×4): qty 1

## 2021-12-09 MED ORDER — KETOROLAC TROMETHAMINE 30 MG/ML IJ SOLN
30.0000 mg | Freq: Once | INTRAMUSCULAR | Status: AC
  Administered 2021-12-09: 30 mg via INTRAVENOUS
  Filled 2021-12-09: qty 1

## 2021-12-09 MED ORDER — ACETAMINOPHEN 650 MG RE SUPP: 650.0000 mg | Freq: Four times a day (QID) | RECTAL | Status: DC | PRN

## 2021-12-09 MED ORDER — OXYCODONE HCL 5 MG PO TABS
5.0000 mg | ORAL_TABLET | ORAL | Status: DC | PRN
  Administered 2021-12-09 – 2021-12-11 (×7): 5 mg via ORAL
  Filled 2021-12-09 (×7): qty 1

## 2021-12-09 NOTE — ED Provider Notes (Signed)
[facility] DEPT Provider Note   CSN: [id number] Arrival date & time: 12/09/21  0025     History  Chief Complaint  Patient presents with   Nephrolithiasis    Glen Austria Sr. is a 56 y.o. male.  He was here yesterday with left flank pain found to have a 6 mm kidney stone.  He also had some chest discomfort and diaphoresis.  He ultimately signed out AMA as he did not have a ride and has taken no pain medicine since discharge.  Complaining of intractable left flank pain since 10 PM last night with associated nausea and vomiting.  No chest pain or shortness of breath currently.  Continues with severe left flank pain.  The history is provided by the patient.  Flank Pain This is a recurrent problem. The current episode started yesterday. The problem occurs constantly. The problem has not changed since onset.Pertinent negatives include no chest pain, no abdominal pain, no headaches and no shortness of breath. Nothing aggravates the symptoms. Nothing relieves the symptoms. He has tried rest for the symptoms. The treatment provided no relief.       Home Medications Prior to Admission medications   Medication Sig Start Date End Date Taking? Authorizing Provider  cyclobenzaprine (FLEXERIL) 10 MG tablet Take 1 tablet (10 mg total) by mouth 2 (two) times daily as needed for muscle spasms. 04/01/13   Muthersbaugh, Jarrett Soho, PA-C  oxyCODONE-acetaminophen (PERCOCET/ROXICET) 5-325 MG tablet Take 1 tablet by mouth every 6 (six) hours as needed for severe pain. 12/08/21   Long, Wonda Olds, MD  Polyvinyl Alcohol (LIQUID TEARS OP) Place 3 drops into the right eye daily as needed (dryness).    [provider]  senna-docusate (SENOKOT-S) 8.6-50 MG tablet Take 1 tablet by mouth at bedtime as needed for mild constipation. 12/08/21   Long, Wonda Olds, MD      Allergies    Sulfa antibiotics and Zz system cleanup for nka flag    Review of Systems   Review of Systems   Respiratory:  Negative for shortness of breath.   Cardiovascular:  Negative for chest pain.  Gastrointestinal:  Positive for nausea and vomiting. Negative for abdominal pain.  Genitourinary:  Positive for flank pain.  Musculoskeletal:  Positive for back pain.  Neurological:  Negative for headaches.    Physical Exam Updated Vital Signs BP (!) 151/99 (BP Location: Left Arm)   Pulse 88   Temp 97.9 F (36.6 C) (Oral)   Resp 17   Ht '5\' 9"'$  (1.753 m)   Wt 83.9 kg   SpO2 100%   BMI 27.32 kg/m  Physical Exam Vitals and nursing note reviewed.  Constitutional:      General: He is not in acute distress.    Appearance: Normal appearance. He is well-developed.  HENT:     Head: Normocephalic and atraumatic.  Eyes:     Conjunctiva/sclera: Conjunctivae normal.  Cardiovascular:     Rate and Rhythm: Normal rate and regular rhythm.     Heart sounds: No murmur heard. Pulmonary:     Effort: Pulmonary effort is normal. No respiratory distress.     Breath sounds: Normal breath sounds.  Abdominal:     Palpations: Abdomen is soft.     Tenderness: There is no abdominal tenderness. There is no guarding or rebound.  Musculoskeletal:        General: Normal range of motion.     Cervical back: Neck supple.     Right lower leg: No  edema.     Left lower leg: No edema.  Skin:    General: Skin is warm and dry.     Capillary Refill: Capillary refill takes less than 2 seconds.  Neurological:     General: No focal deficit present.     Mental Status: He is alert.     Sensory: No sensory deficit.     Motor: No weakness.     Gait: Gait normal.     ED Results / Procedures / Treatments   Labs (all labs ordered are listed, but only abnormal results are displayed) Labs Reviewed  BASIC METABOLIC PANEL - Abnormal; Notable for the following components:      Result Value   Potassium 3.1 (*)    Glucose, Bld 56 (*)    Calcium 7.6 (*)    All other components within normal limits  CBC WITH  DIFFERENTIAL/PLATELET  HIV ANTIBODY (ROUTINE TESTING W REFLEX)  CBC  COMPREHENSIVE METABOLIC PANEL    EKG None  Radiology CT Renal Stone Study  Result Date: 12/08/2021 CLINICAL DATA:  Left flank pain EXAM: CT ABDOMEN AND PELVIS WITHOUT CONTRAST TECHNIQUE: Multidetector CT imaging of the abdomen and pelvis was performed following the standard protocol without IV contrast. RADIATION DOSE REDUCTION: This exam was performed according to the departmental dose-optimization program which includes automated exposure control, adjustment of the mA and/or kV according to patient size and/or use of iterative reconstruction technique. COMPARISON:  08/17/2009 FINDINGS: Lower chest: No acute abnormality Hepatobiliary: No focal hepatic abnormality. Gallbladder unremarkable. Pancreas: No focal abnormality or ductal dilatation. Spleen: No focal abnormality.  Normal size. Adrenals/Urinary Tract: Bilateral hydronephrosis. Left hydronephrosis is due to 6 mm proximal left ureteral stone. Right hydronephrosis extends to just above the pelvic brim. There is no visible calcified stone in this area. Slight soft tissue density appreciated. Cannot exclude obstructing urothelial lesion. Punctate nonobstructing stone in the upper pole of the right kidney. Adrenal glands and urinary bladder unremarkable. Stomach/Bowel: Left colonic diverticulosis. No active diverticulitis. Stomach and small bowel decompressed. Vascular/Lymphatic: Scattered aortic calcifications. No evidence of aneurysm or adenopathy. Reproductive: No visible focal abnormality. Other: No free fluid or free air. Small bilateral inguinal hernias containing fat. Musculoskeletal: No acute bony abnormality. 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 IMPRESSION: Obstructing 6 mm proximal left ureteral stone with mild left hydronephrosis. Right nephrolithiasis. Right hydronephrosis to the level of the pelvic brim. No visible obstructing stone in this area.  Cannot exclude obstructing urothelial lesion. Recommend urologic consultation for further evaluation. Aortic atherosclerosis. Left colonic diverticulosis. Electronically Signed   By: Rolm Baptise M.D.   On: 12/08/2021 14:11   DG Chest 2 View  Result Date: 12/08/2021 CLINICAL DATA:  Chest pain EXAM: CHEST - 2 VIEW COMPARISON:  None Available. FINDINGS: The heart size and mediastinal contours are within normal limits. Both lungs are clear. The visualized skeletal structures are unremarkable. IMPRESSION: No acute cardiopulmonary abnormality. Electronically Signed   By: Beryle Flock M.D.   On: 12/08/2021 13:21    Procedures Procedures    Medications Ordered in ED Medications  tamsulosin (FLOMAX) capsule 0.4 mg (0.4 mg Oral Given 12/09/21 1242)  dextrose 50 % solution 25 g (has no administration in time range)  acetaminophen (TYLENOL) tablet 650 mg (has no administration in time range)    Or  acetaminophen (TYLENOL) suppository 650 mg (has no administration in time range)  ondansetron (ZOFRAN) tablet 4 mg (has no administration in time range)    Or  ondansetron (ZOFRAN) injection  4 mg (has no administration in time range)  potassium chloride SA (KLOR-CON M) CR tablet 40 mEq (40 mEq Oral Given 12/09/21 1554)  lactated ringers bolus 1,000 mL (has no administration in time range)  oxyCODONE (Oxy IR/ROXICODONE) immediate release tablet 5 mg (has no administration in time range)  HYDROmorphone (DILAUDID) injection 1 mg (has no administration in time range)  lactated ringers infusion (has no administration in time range)  nicotine (NICODERM CQ - dosed in mg/24 hours) patch 21 mg (has no administration in time range)  HYDROmorphone (DILAUDID) injection 1 mg (1 mg Intravenous Given 12/09/21 0749)  ondansetron (ZOFRAN) injection 4 mg (4 mg Intravenous Given 12/09/21 0749)  ketorolac (TORADOL) 30 MG/ML injection 30 mg (30 mg Intravenous Given 12/09/21 0749)  sodium chloride 0.9 % bolus 500 mL (0 mLs  Intravenous Stopped 12/09/21 1141)  HYDROmorphone (DILAUDID) injection 1 mg (1 mg Intravenous Given 12/09/21 0956)  HYDROmorphone (DILAUDID) injection 1 mg (1 mg Intravenous Given 12/09/21 1555)  ketorolac (TORADOL) 30 MG/ML injection 30 mg (30 mg Intravenous Given 12/09/21 1555)    ED Course/ Medical Decision Making/ A&P Clinical Course as of 12/09/21 1734  Sun Dec 09, 2021  0910 Patient's pain is much improved with medication.  Unfortunately he said he has no money cannot afford his prescriptions Follow-up with urology and actually has even no way home.  We will place social work consult. [MB]  1137 Pain is recurred and Dilaudid ordered again.  Patient looks more comfortable.  He said he tried the Percocet for the kidney stone pain last evening and it did not touch it.  He is not sure he is going to be able to be discharged if the stone is unlikely to pass on its own. [MB]  1229 Discussed with urology Dr. Kathrynn Ducking who asked if admitting team can reach out to them if patient pain not controlled and would consider stenting [MB]  1236 Discussed with Dr. Olevia Bowens [facility] hospitalist who will evaluate the patient for admission. [MB]    Clinical Course User Index [MB] Hayden Rasmussen, MD                           Medical Decision Making Amount and/or Complexity of Data Reviewed Labs: ordered.  Risk Prescription drug management. Decision regarding hospitalization.   This patient complains of severe left flank pain; this involves an extensive number of treatment Options and is a complaint that carries with it a high risk of complications and morbidity. The differential includes renal colic, pyelonephritis, infected stone, forniceal rupture  I ordered, reviewed and interpreted labs, which included CBC normal, chemistries mildly low potassium normal renal function I ordered medication IV Dilaudid fluids Toradol and reviewed PMP when indicated Previous records obtained and reviewed in epic including  imaging and ED note from yesterday I consulted urology Dr. Kathrynn Ducking and [facility] hospitalist Dr. Olevia Bowens and discussed lab and imaging findings and discussed disposition.  Cardiac monitoring reviewed, normal sinus rhythm Social determinants considered, patient with significant financial and transportation hardships Critical Interventions: None  After the interventions stated above, I reevaluated the patient and found patient's pain to be improved Admission and further testing considered, he would benefit from the hospital for continued hydration and pain control and possible urologic intervention         Final Clinical Impression(s) / ED Diagnoses Final diagnoses:  Ureterolithiasis    Rx / DC Orders ED Discharge Orders     None  Hayden Rasmussen, MD 12/09/21 1736

## 2021-12-09 NOTE — ED Triage Notes (Signed)
Pt BIB with reports of kidney stones. Pt was prescribed medications by his primary today but did not pick them up. Pt is to follow up with urology. Pt is having left flank pain 100 mcg Fentanyl, 18 g left ac.

## 2021-12-09 NOTE — Discharge Instructions (Signed)
[facility] CARD  Name:  Glen Mcmillan ID (MRN):  [id number] [place]: [id number]  RX Group: [id number]  Discharge Date:  12/09/21 Expiration Date:  12/16/21 (must be filled within 7 days of discharge)  Dear  Glen Mcmillan have been approved to have the prescriptions written by your discharging physician filled through our [facility] (Medication Assistance Through [facility]) program. This program allows for a one-time (no refills) 34-day supply of selected medications for a low copay amount.    The copay is $3.00 per prescription. For instance, if you have one prescription, you will pay $3.00; for two prescriptions, you pay $6.00; for three prescriptions, you pay $9.00; and so on.    Only certain pharmacies are participating in this program with [facility]. You will need to select one of the pharmacies from the attached lists and take your prescriptions, this letter, and your photo ID to one of the participating pharmacies.     We are excited that you are able to use the [facility] program to get your medications. These prescriptions must be filled within 7 days of hospital discharge or they will no longer be valid for the [facility] program. Should you have any problems with your prescriptions please contact your case management team member at [phone] for [facility] or [phone] for [facility].    Thank you,    [facility]acies  [place]. [facility]  [address] [place], [place], [place], [place], [employer]  [place], [place], [address],  [place]  [facility] (continued)  [place], [place] [address], [place]  [place], [place], [place]  [address] [place], [place], [facility]                                 [address], [place]  [place], [place]     CVS  [address], [place]  [address], [facility]  [address], [place], [place]  [place], [place], Phillip Heal  [address], [place],   [place], [place]  [place], [place], [place]  [address], [place], [place], [place]  [address] [place], [place]  [address], [place]  [place], [place], [place]  [place], [place], [place], [place] [address] [place], [place], [place]  [place], [place]  [address] [employer]  [place]  [address] [place], [place]  [address], [place]  [address], [place]  [address], [employer]  [place], [place], [place], [place]  [address], [place]  [address], [employer]  [address], [place], [place], [place], [place], [place], [place] (continued)  [address] [place], [place], [place]  [address], [place]  [address] [place]  [address] [place] [place], [place]   [employer]  [address], [place]  [place], [place], [place], [place], [place], [place], [place], [place], [place]  [address], [place], [place], [place], [place] [address], [place]  Other  [place]                           [place], [facility]                                    [place]                                      [address], [facility]         [place]  [address] [place], [place], [place], [place], [place] [address] [place], [place], [place], [place]  [address], [place], [place], [place], [place], [place], [place], [place], [place], [place], [place], [place], [place]  [place], [place], [place], [place], [place], [place], [place], [place], [place], [place], [place]          DISCHARGE INSTRUCTIONS FOR KIDNEY STONE/URETERAL STENT   MEDICATIONS:  1.  Resume all your other meds from home - except do not take any extra narcotic pain meds that you may have at home.  2. Tramadol is for moderate/severe pain, otherwise taking upto 1000 mg every 6 hours of plainTylenol will help treat your pain.   4. Take tamsulosin for the ureteral  spasm and stent discomfort.  ACTIVITY:  1. No  strenuous activity x 1week  2. No driving while on narcotic pain medications  3. Drink plenty of water  4. Continue to walk at home - you can still get blood clots when you are at home, so keep active, but don't over do it.  5. May return to work/school tomorrow or when you feel ready   BATHING:  1. You can shower and we recommend daily showers    SIGNS/SYMPTOMS TO CALL:  Please call us if you have a fever greater than 101.5, uncontrolled nausea/vomiting, uncontrolled pain, dizziness, unable to urinate, bloody urine, chest pain, shortness of breath, leg swelling, leg pain, redness around wound, drainage from wound, or any other concerns or questions.   You can reach [place] at [phone].   FOLLOW-UP:  1.  You will be contacted with the results of the surgery and follow-up instructions by Dr. Louis Meckel.

## 2021-12-09 NOTE — H&P (Signed)
History and Physical    Patient: Glen Mcmillan POE:[id number] DOB: [birth year] DOA: 12/09/2021 DOS: the patient was seen and examined on 12/09/2021 PCP: Patient, No Pcp Per  Patient coming from: Homeless  Chief Complaint:  Chief Complaint  Patient presents with   Nephrolithiasis   HPI: CURVIN HUNGER Sr. is a 56 y.o. male with medical history significant of back pain, sciatica, nephrolithiasis who is coming to the emergency via EMS due to left flank pain secondary to nephrolithiasis.  He was seen in the ED yesterday, but left AMA.  He told the staff in triage that he was prescribed medications but was unable to obtain them.  But he was unable to pick them up.  He is supposed to have a follow-up with urology.  EMS gave in the 100 mcg of fentanyl in route.   No flank pain, dysuria, frequency or hematuria. He denied fever, chills, rhinorrhea, sore throat, wheezing or hemoptysis.  No chest pain, palpitations, diaphoresis, PND, orthopnea or pitting edema of the lower extremities.  No abdominal pain, nausea, emesis, diarrhea, constipation, melena or hematochezia. No polyuria, polydipsia, polyphagia.  He is legally blind.  ED course: Initial vital signs were temperature 97.9 F, pulse 88, respirations 17, BP 151/99 mmHg O2 sat 100% on room air.  The patient received hydromorphone 1 mg IVP x2, ondansetron 4 mg IVP, ketorolac 30 mg IVP and 500 mL of normal saline bolus.  I added LR 1000 mL, hydromorphone 1 mg IVP and Toradol 30 mg IVP.  Lab work: His CBC was normal.  BMP showed potassium of 3.1 mmol/L, glucose of 56 and calcium 7.6 mg/dL.  Imaging: Two-view chest radiograph with no acute cardiopulmonary pathology.  CT renal protocol showing obstructive 6 mm left ureteral stone with mild left hydronephrosis.  There was a punctate nonobstructing stone in the upper pole of the right kidney.   Review of Systems: As mentioned in the history of present illness. All other systems reviewed and are  negative.  Past Medical History:  Diagnosis Date   Back pain    Renal disorder    kidney stones   Sciatica    Past Surgical History:  Procedure Laterality Date   APPENDECTOMY     EYE SURGERY     Social History:  reports that he has been smoking cigarettes. He has been smoking an average of .5 packs per day. He has never used smokeless tobacco. He reports that he does not drink alcohol and does not use drugs.  Allergies  Allergen Reactions   Sulfa Antibiotics Itching   Zz System Cleanup For Nka Flag     No Known Allergies Flag corrected after system error with merged record. Doy Mince     Family History  Problem Relation Age of Onset   Cancer Mother        breast   Heart failure Mother        pacemaker   Diabetes Father        deceased   Heart failure Father     Prior to Admission medications   Medication Sig Start Date End Date Taking? Authorizing Provider  latanoprost (XALATAN) 0.005 % ophthalmic solution 1 drop at bedtime.   Yes [provider]  Polyvinyl Alcohol (LIQUID TEARS OP) Place 3 drops into the right eye daily as needed (dryness).   Yes [provider]  timolol (TIMOPTIC) 0.25 % ophthalmic solution Place 1 drop into the left eye at bedtime.   Yes [provider]  cyclobenzaprine (FLEXERIL) 10 MG tablet Take 1 tablet (10 mg total) by mouth 2 (two) times daily as needed for muscle spasms. Patient not taking: Reported on 12/09/2021 04/01/13   Muthersbaugh, Jarrett Soho, PA-C  oxyCODONE-acetaminophen (PERCOCET/ROXICET) 5-325 MG tablet Take 1 tablet by mouth every 6 (six) hours as needed for severe pain. Patient not taking: Reported on 12/09/2021 12/08/21   Long, Wonda Olds, MD  senna-docusate (SENOKOT-S) 8.6-50 MG tablet Take 1 tablet by mouth at bedtime as needed for mild constipation. Patient not taking: Reported on 12/09/2021 12/08/21   Margette Fast, MD    Physical Exam: Vitals:   12/09/21 0845 12/09/21 1030 12/09/21 1128 12/09/21  1130  BP: 99/74 102/86  132/81  Pulse: 74 70  79  Resp: '16 16  16  '$ Temp:   97.7 F (36.5 C)   TempSrc:   Oral   SpO2: 91% 96%  100%  Weight:      Height:       Physical Exam Vitals and nursing note reviewed.  Constitutional:      General: He is awake. He is not in acute distress.    Appearance: Normal appearance. He is overweight. He is not ill-appearing.  HENT:     Head: Normocephalic.     Nose: No rhinorrhea.     Mouth/Throat:     Mouth: Mucous membranes are moist.  Eyes:     General: No scleral icterus.    Pupils: Pupils are equal, round, and reactive to light.  Neck:     Vascular: No JVD.  Cardiovascular:     Rate and Rhythm: Normal rate and regular rhythm.     Heart sounds: S1 normal and S2 normal.  Pulmonary:     Effort: Pulmonary effort is normal. No respiratory distress.     Breath sounds: Normal breath sounds. No wheezing, rhonchi or rales.  Abdominal:     General: Bowel sounds are normal. There is no distension.     Palpations: Abdomen is soft.     Tenderness: There is no abdominal tenderness. There is left CVA tenderness. There is no right CVA tenderness, guarding or rebound.  Musculoskeletal:     Cervical back: Neck supple.     Right lower leg: No edema.     Left lower leg: No edema.  Skin:    General: Skin is warm and dry.  Neurological:     General: No focal deficit present.     Mental Status: He is alert and oriented to person, place, and time.  Psychiatric:        Mood and Affect: Mood normal.        Behavior: Behavior normal. Behavior is cooperative.    Data Reviewed:  There are no new results to review at this time.  Assessment and Plan: Principal Problem:   Renal colic on left side Secondary to:   Urolithiasis Complicated by:   Hydronephrosis of left kidney No signs of infection. Observation/MedSurg. Continue IV fluids. Continue tamsulosin. Analgesics as needed. Strain all urine. Consult urology if no resolution. Otherwise  follow-up with urology as an outpatient.  Active Problems:   Tobacco use Smoking cessation advised. Nicotine replacement therapy ordered.    Hypokalemia Replacing. Follow potassium level in the morning.    Hypercalcemia Recheck calcium level in the morning. Further work-up depending on results.     Advance Care Planning:   Code Status: Full Code   Consults:   Family Communication:   Severity of Illness: The appropriate patient status for this  patient is OBSERVATION. Observation status is judged to be reasonable and necessary in order to provide the required intensity of service to ensure the patient's safety. [name] presenting symptoms, physical exam findings, and initial radiographic and laboratory data in the context of their medical condition is felt to place them at decreased risk for further clinical deterioration. Furthermore, it is anticipated that the patient will be medically stable for discharge from the hospital within 2 midnights of admission.   Author: Reubin Milan, MD 12/09/2021 12:58 PM  For on call review [email or web address].   This document was prepared using Dragon voice recognition software and may contain some unintended transcription errors.

## 2021-12-09 NOTE — Care Management (Signed)
Consult for medication assistance. Patient has no insurance and no money for medications. MATCH medication assistance done placed on patient instructions/discharge instructions to print. He needs to take this to the pharmacy.   [place] CARD  Name:  Glen Mcmillan ID (MRN):  [id number] [place]: [id number]  RX Group: [id number]  Discharge Date:  12/09/21 Expiration Date:  12/16/21 (must be filled within 7 days of discharge)

## 2021-12-09 NOTE — ED Notes (Deleted)
Pt is currently in the triage bathroom and is having a bowel movement

## 2021-12-10 ENCOUNTER — Inpatient Hospital Stay (HOSPITAL_COMMUNITY): Payer: Self-pay

## 2021-12-10 DIAGNOSIS — N200 Calculus of kidney: Secondary | ICD-10-CM

## 2021-12-10 LAB — COMPREHENSIVE METABOLIC PANEL
ALT: 9 U/L (ref 0–44)
AST: 11 U/L — ABNORMAL LOW (ref 15–41)
Albumin: 2.8 g/dL — ABNORMAL LOW (ref 3.5–5.0)
Alkaline Phosphatase: 42 U/L (ref 38–126)
Anion gap: 4 — ABNORMAL LOW (ref 5–15)
BUN: 19 mg/dL (ref 6–20)
CO2: 26 mmol/L (ref 22–32)
Calcium: 7.9 mg/dL — ABNORMAL LOW (ref 8.9–10.3)
Chloride: 109 mmol/L (ref 98–111)
Creatinine, Ser: 0.8 mg/dL (ref 0.61–1.24)
GFR, Estimated: >60 mL/min (ref >60)
Glucose, Bld: 135 mg/dL — ABNORMAL HIGH (ref 70–99)
Potassium: 4 mmol/L (ref 3.5–5.1)
Sodium: 139 mmol/L (ref 135–145)
Total Bilirubin: 0.8 mg/dL (ref 0.3–1.2)
Total Protein: 5.3 g/dL — ABNORMAL LOW (ref 6.5–8.1)

## 2021-12-10 LAB — URINE CULTURE: Culture: NO GROWTH

## 2021-12-10 LAB — HIV ANTIBODY (ROUTINE TESTING W REFLEX): HIV Screen 4th Generation wRfx: NONREACTIVE

## 2021-12-10 LAB — CBC
HCT: 36.4 % — ABNORMAL LOW (ref 39.0–52.0)
Hemoglobin: 11.9 g/dL — ABNORMAL LOW (ref 13.0–17.0)
MCH: 31.1 pg (ref 26.0–34.0)
MCHC: 32.7 g/dL (ref 30.0–36.0)
MCV: 95 fL (ref 80.0–100.0)
Platelets: 141 10*3/uL — ABNORMAL LOW (ref 150–400)
RBC: 3.83 MIL/uL — ABNORMAL LOW (ref 4.22–5.81)
RDW: 13.8 % (ref 11.5–15.5)
WBC: 5.9 10*3/uL (ref 4.0–10.5)
nRBC: 0 % (ref 0.0–0.2)

## 2021-12-10 NOTE — H&P (View-Only) (Signed)
I have been asked to see the patient by Dr. Tennis Must, for evaluation and management of left proximal ureteral stone and right hydronephrosis.  History of present illness: 56 year old white male recently relocated here to [facility] presents to the emergency department with acute onset left-sided flank pain with associated nausea and vomiting.  The patient was initially seen and discharged from the emergency department with pain medication.  After another severe renal colic episode the patient called 911 and represented to the emergency department with severe and poorly controlled pain.  He was admitted to the hospital service for observation.  Since the patient was admitted he has been receiving IV pain medication as well as hydration.  His pain is currently fairly well controlled.  He denies any fevers or chills.  He denies any ongoing nausea or vomiting.  He denies any gross hematuria.  The pain is located the patient's left lower abdomen.  [name] CT scan demonstrates hydronephrosis of the right proximal ureter down to the level of the pelvic brim and iliac vessels.  There is no evidence of obstruction over there.  He has no right-sided flank pain.  Review of systems: A 12 point comprehensive review of systems was obtained and is negative unless otherwise stated in the history of present illness.  Patient Active Problem List   Diagnosis Date Noted   Nephrolithiasis 12/10/2021   Urolithiasis 12/09/2021   Hydronephrosis of left kidney 77/41/2878   Renal colic on left side 67/67/2094   Tobacco use 12/09/2021   Hypokalemia 12/09/2021   Hypercalcemia 12/09/2021    No current facility-administered medications on file prior to encounter.   Current Outpatient Medications on File Prior to Encounter  Medication Sig Dispense Refill   latanoprost (XALATAN) 0.005 % ophthalmic solution 1 drop at bedtime.     Polyvinyl Alcohol (LIQUID TEARS OP) Place 3 drops into the right eye daily as needed  (dryness).     timolol (TIMOPTIC) 0.25 % ophthalmic solution Place 1 drop into the left eye at bedtime.     cyclobenzaprine (FLEXERIL) 10 MG tablet Take 1 tablet (10 mg total) by mouth 2 (two) times daily as needed for muscle spasms. (Patient not taking: Reported on 12/09/2021) 20 tablet 0   oxyCODONE-acetaminophen (PERCOCET/ROXICET) 5-325 MG tablet Take 1 tablet by mouth every 6 (six) hours as needed for severe pain. (Patient not taking: Reported on 12/09/2021) 15 tablet 0   senna-docusate (SENOKOT-S) 8.6-50 MG tablet Take 1 tablet by mouth at bedtime as needed for mild constipation. (Patient not taking: Reported on 12/09/2021) 20 tablet 0    Past Medical History:  Diagnosis Date   Back pain    Renal disorder    kidney stones   Sciatica     Past Surgical History:  Procedure Laterality Date   APPENDECTOMY     EYE SURGERY      Social History   Tobacco Use   Smoking status: Every Day    Packs/day: 0.50    Types: Cigarettes   Smokeless tobacco: Never  Vaping Use   Vaping Use: Some days  Substance Use Topics   Alcohol use: No   Drug use: No    Family History  Problem Relation Age of Onset   Cancer Mother        breast   Heart failure Mother        pacemaker   Diabetes Father        deceased   Heart failure Father     PE: Vitals:  12/09/21 1955 12/10/21 0035 12/10/21 0504 12/10/21 1329  BP: 122/75 (!) 89/68 120/75 124/77  Pulse: 82 73 66 72  Resp: '20 18 16 18  '$ Temp: (!) 97.3 F (36.3 C) 97.7 F (36.5 C) 97.9 F (36.6 C) 99 F (37.2 C)  TempSrc: Oral Oral Oral Oral  SpO2: 98% 93% 96% 96%  Weight:      Height:       Patient appears to be in no acute distress  patient is alert and oriented x3 Atraumatic normocephalic head No cervical or supraclavicular lymphadenopathy appreciated No increased work of breathing, no audible wheezes/rhonchi Regular sinus rhythm/rate Abdomen is soft, nontender, left lower quadrant pain with palpation Lower extremities are  symmetric without appreciable edema Grossly neurologically intact No identifiable skin lesions  Recent Labs    12/08/21 1415 12/09/21 0738 12/10/21 0414  WBC 7.6 9.1 5.9  HGB 14.4 13.6 11.9*  HCT 43.4 40.4 36.4*   Recent Labs    12/08/21 1714 12/09/21 0738 12/10/21 0414  NA 139 142 139  K 3.7 3.1* 4.0  CL 106 108 109  CO2 '27 27 26  '$ GLUCOSE 103* 56* 135*  BUN '15 18 19  '$ CREATININE 0.85 0.98 0.80  CALCIUM 8.4* 7.6* 7.9*   No results for input(s): "LABPT", "INR" in the last 72 hours. No results for input(s): "LABURIN" in the last 72 hours. Results for orders placed or performed during the hospital encounter of 12/08/21  Urine Culture     Status: None   Collection Time: 12/08/21  6:36 PM   Specimen: Urine, Random  Result Value Ref Range Status   Specimen Description   Final    URINE, RANDOM Performed at [facility] [address] [place], [place] [address]    Special Requests   Final    NONE Performed at [facility], [place] [address], [place], [place] [address]    Culture   Final    NO GROWTH Performed at [facility], [place] [address] [place], [place] [address]    Report Status 12/10/2021 FINAL  Final    Imaging: I have independently reviewed the patient's CT scan performed on 12/08/2021.  I reviewed the x-ray report, spoke with the patient about the findings and explained to him the significance of bilateral hydronephrosis.    Imp: Left-sided proximal ureteral stone with initially poorly controlled pain but with the pain now that has seemingly been better controlled.  He also has right-sided hydro nephrosis down to the level of the pelvic brim.  Fortunately the patient's renal function is normal.  There is no evidence of infection.  Recommendations: The patient I spoke about the treatment options.  Ultimately we decided the best course of action would be to take the patient to the operating room for left ureteroscopy,  laser lithotripsy and stone removal as well as right-sided diagnostic ureteroscopy.  We plan to put stents in both ureters temporarily.  I went through the surgery with the patient in some detail and we discussed the potential risk and benefits.  We also discussed the alternatives.  After going through all of this he is opted to proceed.  The patient has been scheduled as an outpatient on Wednesday morning.  I recommend he be discharged tomorrow and then he can come to the hospital Wednesday morning for his scheduled surgery.  We will contact him with the details and instructions.  Upon patient's discharge would recommend that he be given 10 mg of oxycodone to  take at a time, possibly every 4-6 hours so that his pain can be adequately controlled and he does not need to stay in the hospital any longer than necessary.   Ardis Hughs

## 2021-12-10 NOTE — Consult Note (Signed)
I have been asked to see the patient by Dr. Tennis Must, for evaluation and management of left proximal ureteral stone and right hydronephrosis.  History of present illness: 56 year old white male recently relocated here to [facility] presents to the emergency department with acute onset left-sided flank pain with associated nausea and vomiting.  The patient was initially seen and discharged from the emergency department with pain medication.  After another severe renal colic episode the patient called 911 and represented to the emergency department with severe and poorly controlled pain.  He was admitted to the hospital service for observation.  Since the patient was admitted he has been receiving IV pain medication as well as hydration.  His pain is currently fairly well controlled.  He denies any fevers or chills.  He denies any ongoing nausea or vomiting.  He denies any gross hematuria.  The pain is located the patient's left lower abdomen.  [name] CT scan demonstrates hydronephrosis of the right proximal ureter down to the level of the pelvic brim and iliac vessels.  There is no evidence of obstruction over there.  He has no right-sided flank pain.  Review of systems: A 12 point comprehensive review of systems was obtained and is negative unless otherwise stated in the history of present illness.  Patient Active Problem List   Diagnosis Date Noted   Nephrolithiasis 12/10/2021   Urolithiasis 12/09/2021   Hydronephrosis of left kidney 16/12/9602   Renal colic on left side 54/11/8117   Tobacco use 12/09/2021   Hypokalemia 12/09/2021   Hypercalcemia 12/09/2021    No current facility-administered medications on file prior to encounter.   Current Outpatient Medications on File Prior to Encounter  Medication Sig Dispense Refill   latanoprost (XALATAN) 0.005 % ophthalmic solution 1 drop at bedtime.     Polyvinyl Alcohol (LIQUID TEARS OP) Place 3 drops into the right eye daily as needed  (dryness).     timolol (TIMOPTIC) 0.25 % ophthalmic solution Place 1 drop into the left eye at bedtime.     cyclobenzaprine (FLEXERIL) 10 MG tablet Take 1 tablet (10 mg total) by mouth 2 (two) times daily as needed for muscle spasms. (Patient not taking: Reported on 12/09/2021) 20 tablet 0   oxyCODONE-acetaminophen (PERCOCET/ROXICET) 5-325 MG tablet Take 1 tablet by mouth every 6 (six) hours as needed for severe pain. (Patient not taking: Reported on 12/09/2021) 15 tablet 0   senna-docusate (SENOKOT-S) 8.6-50 MG tablet Take 1 tablet by mouth at bedtime as needed for mild constipation. (Patient not taking: Reported on 12/09/2021) 20 tablet 0    Past Medical History:  Diagnosis Date   Back pain    Renal disorder    kidney stones   Sciatica     Past Surgical History:  Procedure Laterality Date   APPENDECTOMY     EYE SURGERY      Social History   Tobacco Use   Smoking status: Every Day    Packs/day: 0.50    Types: Cigarettes   Smokeless tobacco: Never  Vaping Use   Vaping Use: Some days  Substance Use Topics   Alcohol use: No   Drug use: No    Family History  Problem Relation Age of Onset   Cancer Mother        breast   Heart failure Mother        pacemaker   Diabetes Father        deceased   Heart failure Father     PE: Vitals:  12/09/21 1955 12/10/21 0035 12/10/21 0504 12/10/21 1329  BP: 122/75 (!) 89/68 120/75 124/77  Pulse: 82 73 66 72  Resp: '20 18 16 18  '$ Temp: (!) 97.3 F (36.3 C) 97.7 F (36.5 C) 97.9 F (36.6 C) 99 F (37.2 C)  TempSrc: Oral Oral Oral Oral  SpO2: 98% 93% 96% 96%  Weight:      Height:       Patient appears to be in no acute distress  patient is alert and oriented x3 Atraumatic normocephalic head No cervical or supraclavicular lymphadenopathy appreciated No increased work of breathing, no audible wheezes/rhonchi Regular sinus rhythm/rate Abdomen is soft, nontender, left lower quadrant pain with palpation Lower extremities are  symmetric without appreciable edema Grossly neurologically intact No identifiable skin lesions  Recent Labs    12/08/21 1415 12/09/21 0738 12/10/21 0414  WBC 7.6 9.1 5.9  HGB 14.4 13.6 11.9*  HCT 43.4 40.4 36.4*   Recent Labs    12/08/21 1714 12/09/21 0738 12/10/21 0414  NA 139 142 139  K 3.7 3.1* 4.0  CL 106 108 109  CO2 '27 27 26  '$ GLUCOSE 103* 56* 135*  BUN '15 18 19  '$ CREATININE 0.85 0.98 0.80  CALCIUM 8.4* 7.6* 7.9*   No results for input(s): "LABPT", "INR" in the last 72 hours. No results for input(s): "LABURIN" in the last 72 hours. Results for orders placed or performed during the hospital encounter of 12/08/21  Urine Culture     Status: None   Collection Time: 12/08/21  6:36 PM   Specimen: Urine, Random  Result Value Ref Range Status   Specimen Description   Final    URINE, RANDOM Performed at [facility] [address], [place], [place] [address]    Special Requests   Final    NONE Performed at [facility], [place] [address] [place], [place] [address]    Culture   Final    NO GROWTH Performed at [facility], [place] [address], [place], [place] [address]    Report Status 12/10/2021 FINAL  Final    Imaging: I have independently reviewed the patient's CT scan performed on 12/08/2021.  I reviewed the x-ray report, spoke with the patient about the findings and explained to him the significance of bilateral hydronephrosis.    Imp: Left-sided proximal ureteral stone with initially poorly controlled pain but with the pain now that has seemingly been better controlled.  He also has right-sided hydro nephrosis down to the level of the pelvic brim.  Fortunately the patient's renal function is normal.  There is no evidence of infection.  Recommendations: The patient I spoke about the treatment options.  Ultimately we decided the best course of action would be to take the patient to the operating room for left ureteroscopy,  laser lithotripsy and stone removal as well as right-sided diagnostic ureteroscopy.  We plan to put stents in both ureters temporarily.  I went through the surgery with the patient in some detail and we discussed the potential risk and benefits.  We also discussed the alternatives.  After going through all of this he is opted to proceed.  The patient has been scheduled as an outpatient on Wednesday morning.  I recommend he be discharged tomorrow and then he can come to the hospital Wednesday morning for his scheduled surgery.  We will contact him with the details and instructions.  Upon patient's discharge would recommend that he be given 10 mg of oxycodone to  take at a time, possibly every 4-6 hours so that his pain can be adequately controlled and he does not need to stay in the hospital any longer than necessary.   Ardis Hughs

## 2021-12-10 NOTE — H&P (View-Only) (Signed)
I have been asked to see the patient by Dr. Tennis Must, for evaluation and management of left proximal ureteral stone and right hydronephrosis.  History of present illness: 56 year old white male recently relocated here to [facility] presents to the emergency department with acute onset left-sided flank pain with associated nausea and vomiting.  The patient was initially seen and discharged from the emergency department with pain medication.  After another severe renal colic episode the patient called 911 and represented to the emergency department with severe and poorly controlled pain.  He was admitted to the hospital service for observation.  Since the patient was admitted he has been receiving IV pain medication as well as hydration.  His pain is currently fairly well controlled.  He denies any fevers or chills.  He denies any ongoing nausea or vomiting.  He denies any gross hematuria.  The pain is located the patient's left lower abdomen.  [name] CT scan demonstrates hydronephrosis of the right proximal ureter down to the level of the pelvic brim and iliac vessels.  There is no evidence of obstruction over there.  He has no right-sided flank pain.  Review of systems: A 12 point comprehensive review of systems was obtained and is negative unless otherwise stated in the history of present illness.  Patient Active Problem List   Diagnosis Date Noted   Nephrolithiasis 12/10/2021   Urolithiasis 12/09/2021   Hydronephrosis of left kidney 68/34/1962   Renal colic on left side 22/97/9892   Tobacco use 12/09/2021   Hypokalemia 12/09/2021   Hypercalcemia 12/09/2021    No current facility-administered medications on file prior to encounter.   Current Outpatient Medications on File Prior to Encounter  Medication Sig Dispense Refill   latanoprost (XALATAN) 0.005 % ophthalmic solution 1 drop at bedtime.     Polyvinyl Alcohol (LIQUID TEARS OP) Place 3 drops into the right eye daily as needed  (dryness).     timolol (TIMOPTIC) 0.25 % ophthalmic solution Place 1 drop into the left eye at bedtime.     cyclobenzaprine (FLEXERIL) 10 MG tablet Take 1 tablet (10 mg total) by mouth 2 (two) times daily as needed for muscle spasms. (Patient not taking: Reported on 12/09/2021) 20 tablet 0   oxyCODONE-acetaminophen (PERCOCET/ROXICET) 5-325 MG tablet Take 1 tablet by mouth every 6 (six) hours as needed for severe pain. (Patient not taking: Reported on 12/09/2021) 15 tablet 0   senna-docusate (SENOKOT-S) 8.6-50 MG tablet Take 1 tablet by mouth at bedtime as needed for mild constipation. (Patient not taking: Reported on 12/09/2021) 20 tablet 0    Past Medical History:  Diagnosis Date   Back pain    Renal disorder    kidney stones   Sciatica     Past Surgical History:  Procedure Laterality Date   APPENDECTOMY     EYE SURGERY      Social History   Tobacco Use   Smoking status: Every Day    Packs/day: 0.50    Types: Cigarettes   Smokeless tobacco: Never  Vaping Use   Vaping Use: Some days  Substance Use Topics   Alcohol use: No   Drug use: No    Family History  Problem Relation Age of Onset   Cancer Mother        breast   Heart failure Mother        pacemaker   Diabetes Father        deceased   Heart failure Father     PE: Vitals:  12/09/21 1955 12/10/21 0035 12/10/21 0504 12/10/21 1329  BP: 122/75 (!) 89/68 120/75 124/77  Pulse: 82 73 66 72  Resp: '20 18 16 18  '$ Temp: (!) 97.3 F (36.3 C) 97.7 F (36.5 C) 97.9 F (36.6 C) 99 F (37.2 C)  TempSrc: Oral Oral Oral Oral  SpO2: 98% 93% 96% 96%  Weight:      Height:       Patient appears to be in no acute distress  patient is alert and oriented x3 Atraumatic normocephalic head No cervical or supraclavicular lymphadenopathy appreciated No increased work of breathing, no audible wheezes/rhonchi Regular sinus rhythm/rate Abdomen is soft, nontender, left lower quadrant pain with palpation Lower extremities are  symmetric without appreciable edema Grossly neurologically intact No identifiable skin lesions  Recent Labs    12/08/21 1415 12/09/21 0738 12/10/21 0414  WBC 7.6 9.1 5.9  HGB 14.4 13.6 11.9*  HCT 43.4 40.4 36.4*   Recent Labs    12/08/21 1714 12/09/21 0738 12/10/21 0414  NA 139 142 139  K 3.7 3.1* 4.0  CL 106 108 109  CO2 '27 27 26  '$ GLUCOSE 103* 56* 135*  BUN '15 18 19  '$ CREATININE 0.85 0.98 0.80  CALCIUM 8.4* 7.6* 7.9*   No results for input(s): "LABPT", "INR" in the last 72 hours. No results for input(s): "LABURIN" in the last 72 hours. Results for orders placed or performed during the hospital encounter of 12/08/21  Urine Culture     Status: None   Collection Time: 12/08/21  6:36 PM   Specimen: Urine, Random  Result Value Ref Range Status   Specimen Description   Final    URINE, RANDOM Performed at [facility] [address] [place], [place] [address]    Special Requests   Final    NONE Performed at [facility], [place] [address], [place], [place] [address]    Culture   Final    NO GROWTH Performed at [facility], [place] [address], [place], [place] [address]    Report Status 12/10/2021 FINAL  Final    Imaging: I have independently reviewed the patient's CT scan performed on 12/08/2021.  I reviewed the x-ray report, spoke with the patient about the findings and explained to him the significance of bilateral hydronephrosis.    Imp: Left-sided proximal ureteral stone with initially poorly controlled pain but with the pain now that has seemingly been better controlled.  He also has right-sided hydro nephrosis down to the level of the pelvic brim.  Fortunately the patient's renal function is normal.  There is no evidence of infection.  Recommendations: The patient I spoke about the treatment options.  Ultimately we decided the best course of action would be to take the patient to the operating room for left ureteroscopy,  laser lithotripsy and stone removal as well as right-sided diagnostic ureteroscopy.  We plan to put stents in both ureters temporarily.  I went through the surgery with the patient in some detail and we discussed the potential risk and benefits.  We also discussed the alternatives.  After going through all of this he is opted to proceed.  The patient has been scheduled as an outpatient on Wednesday morning.  I recommend he be discharged tomorrow and then he can come to the hospital Wednesday morning for his scheduled surgery.  We will contact him with the details and instructions.  Upon patient's discharge would recommend that he be given 10 mg of oxycodone to  take at a time, possibly every 4-6 hours so that his pain can be adequately controlled and he does not need to stay in the hospital any longer than necessary.   Ardis Hughs

## 2021-12-10 NOTE — TOC Initial Note (Signed)
Transition of Care (TOC) - Initial/Assessment Note    Patient Details  Name: Glen Mcmillan. MRN: [id number] Date of Birth: 04/16/1965  Transition of Care [facility]) CM/SW Contact:    Leeroy Cha, RN Phone Number: 12/10/2021, 7:51 AM  Clinical Narrative:                 [id number]/ chart reviewed.  Following for toc needs.  Plan is to return home with self-care currently. Patient is legally blind/Glen Mcmillan is the contact person on file. Expected Discharge Plan: Home/Self Care Barriers to Discharge: Continued Medical Work up   Patient Goals and CMS Choice Patient states their goals for this hospitalization and ongoing recovery are:: to go back home      Expected Discharge Plan and Services Expected Discharge Plan: Home/Self Care   Discharge Planning Services: CM Consult   Living arrangements for the past 2 months: Single Family Home (from [place].)                                      Prior Living Arrangements/Services Living arrangements for the past 2 months: Single Family Home (from [place].) Lives with:: Self Patient language and need for interpreter reviewed:: Yes Do you feel safe going back to the place where you live?: Yes            Criminal Activity/Legal Involvement Pertinent to Current Situation/Hospitalization: No - Comment as needed  Activities of Daily Living Home Assistive Devices/Equipment: Eyeglasses ADL Screening (condition at time of admission) Patient's cognitive ability adequate to safely complete daily activities?: Yes Is the patient deaf or have difficulty hearing?: No Does the patient have difficulty seeing, even when wearing glasses/contacts?: No Does the patient have difficulty concentrating, remembering, or making decisions?: No Patient able to express need for assistance with ADLs?: Yes Does the patient have difficulty dressing or bathing?: No Independently performs ADLs?: Yes (appropriate for developmental age) Does the  patient have difficulty walking or climbing stairs?: No Weakness of Legs: None Weakness of Arms/Hands: None  Permission Sought/Granted                  Emotional Assessment Appearance:: Appears stated age Attitude/Demeanor/Rapport: Engaged Affect (typically observed): Calm Orientation: : Oriented to Self, Oriented to Place, Oriented to  Time, Oriented to Situation Alcohol / Substance Use: Tobacco Use Psych Involvement: No (comment)  Admission diagnosis:  Ureterolithiasis [E36.6] Renal colic on left side [Q94] Patient Active Problem List   Diagnosis Date Noted   Urolithiasis 12/09/2021   Hydronephrosis of left kidney 76/54/6503   Renal colic on left side 54/65/6812   Tobacco use 12/09/2021   Hypokalemia 12/09/2021   Hypercalcemia 12/09/2021   PCP:  Patient, No Pcp Per Pharmacy:   [facility] ([place]), [place] - [address] [place] [place] [address] Phone: [phone] Fax: [phone]     Social Determinants of Health (SDOH) Interventions    Readmission Risk Interventions   No data to display

## 2021-12-10 NOTE — Progress Notes (Signed)
PROGRESS NOTE  KOLBEY TEICHERT Sr. CHY:[id number] DOB: Jul 21, 1965 DOA: 12/09/2021 PCP: Patient, No Pcp Per   LOS: 0 days   Brief Narrative / Interim history:  56 y.o. male with medical history significant of back pain, sciatica, nephrolithiasis who is coming to the emergency via EMS due to left flank pain secondary to nephrolithiasis.  He was seen in the ED yesterday, but left AMA.  He told the staff in triage that he was prescribed medications but was unable to obtain them.  But he was unable to pick them up.  He is supposed to have a follow-up with urology.  Subjective / 24h Interval events: Feels that his left flank pain is moving/migrating, thinks the stone is moving.  Assesement and Plan: Principal Problem:   Renal colic on left side Active Problems:   Urolithiasis   Hydronephrosis of left kidney   Tobacco use   Hypokalemia   Hypercalcemia   Principal problem Obstructing nephrolithiasis-CT scan done on admission showed an obstructive 6 mm proximal left ureteral stone with mild left hydronephrosis.  Patient had some migratory symptoms today, unclear etiology.  He has not passed the stone and continues to have pain.  Will obtain a limited renal ultrasound to reevaluate, and will discuss with urology.  For now continue to monitor, he is afebrile and otherwise stable.  Temp this afternoon 99 -CT scan also showed right nephrolithiasis, and right hydronephrosis to the level of the pelvic brim without visible obstructing stone, cannot rule out obstructive urothelial lesion.  Active problems Hypokalemia-replete potassium and continue to monitor.  Check magnesium  Anemia, thrombocytopenia-no bleeding, they look dilutional as he has received IV fluids.  Tobacco use-nicotine replacement  Scheduled Meds:  nicotine  21 mg Transdermal Daily   tamsulosin  0.4 mg Oral Daily   Continuous Infusions:  lactated ringers 125 mL/hr at 12/10/21 1218   PRN Meds:.acetaminophen **OR** acetaminophen,  dextrose, HYDROmorphone (DILAUDID) injection, ondansetron **OR** ondansetron (ZOFRAN) IV, oxyCODONE  Current Outpatient Medications  Medication Instructions   cyclobenzaprine (FLEXERIL) 10 mg, Oral, 2 times daily PRN   latanoprost (XALATAN) 0.005 % ophthalmic solution 1 drop, Daily at bedtime   oxyCODONE-acetaminophen (PERCOCET/ROXICET) 5-325 MG tablet 1 tablet, Oral, Every 6 hours PRN   Polyvinyl Alcohol (LIQUID TEARS OP) 3 drops, Daily PRN   senna-docusate (SENOKOT-S) 8.6-50 MG tablet 1 tablet, Oral, At bedtime PRN   timolol (TIMOPTIC) 0.25 % ophthalmic solution 1 drop, Left Eye, Daily at bedtime    Diet Orders (From admission, onward)     Start     Ordered   12/09/21 1255  Diet regular Room service appropriate? Yes; Fluid consistency: Thin  Diet effective now       Question Answer Comment  Room service appropriate? Yes   Fluid consistency: Thin      12/09/21 1254            DVT prophylaxis:    Lab Results  Component Value Date   PLT 141 (L) 12/10/2021      Code Status: Full Code  Family Communication: no family at bedside  Status is: Inpatient, persistent symptoms  Level of care: Med-Surg  Objective: Vitals:   12/09/21 1449 12/09/21 1955 12/10/21 0035 12/10/21 0504  BP: 114/77 122/75 (!) 89/68 120/75  Pulse: 69 82 73 66  Resp: '18 20 18 16  '$ Temp: 98.1 F (36.7 C) (!) 97.3 F (36.3 C) 97.7 F (36.5 C) 97.9 F (36.6 C)  TempSrc: Oral Oral Oral Oral  SpO2: 100% 98% 93% 96%  Weight:      Height:        Intake/Output Summary (Last 24 hours) at 12/10/2021 1328 Last data filed at 12/10/2021 1000 Gross per 24 hour  Intake 1945.55 ml  Output 1300 ml  Net 645.55 ml   Wt Readings from Last 3 Encounters:  12/09/21 83.9 kg  12/08/21 83.9 kg  04/26/13 91.3 kg    Examination:  Constitutional: NAD Eyes: no scleral icterus ENMT: Mucous membranes are moist.  Neck: normal, supple Respiratory: clear to auscultation bilaterally, no wheezing, no crackles.  Normal respiratory effort. No accessory muscle use.  Cardiovascular: Regular rate and rhythm, no murmurs / rubs / gallops. No LE edema.  Abdomen: non distended, no tenderness. Bowel sounds positive.  Musculoskeletal: no clubbing / cyanosis.  Skin: no rashes Neurologic: non focal   Data Reviewed: I have independently reviewed following labs and imaging studies   CBC Recent Labs  Lab 12/08/21 1415 12/09/21 0738 12/10/21 0414  WBC 7.6 9.1 5.9  HGB 14.4 13.6 11.9*  HCT 43.4 40.4 36.4*  PLT 172 170 141*  MCV 92.7 92.2 95.0  MCH 30.8 31.1 31.1  MCHC 33.2 33.7 32.7  RDW 13.6 13.5 13.8  LYMPHSABS  --  3.5  --   MONOABS  --  0.9  --   EOSABS  --  0.1  --   BASOSABS  --  0.1  --     Recent Labs  Lab 12/08/21 1714 12/09/21 0738 12/10/21 0414  NA 139 142 139  K 3.7 3.1* 4.0  CL 106 108 109  CO2 '27 27 26  '$ GLUCOSE 103* 56* 135*  BUN '15 18 19  '$ CREATININE 0.85 0.98 0.80  CALCIUM 8.4* 7.6* 7.9*  AST 12*  --  11*  ALT 11  --  9  ALKPHOS 52  --  42  BILITOT 1.3*  --  0.8  ALBUMIN 3.8  --  2.8*    ------------------------------------------------------------------------------------------------------------------ No results for input(s): "CHOL", "HDL", "LDLCALC", "TRIG", "CHOLHDL", "LDLDIRECT" in the last 72 hours.  No results found for: "HGBA1C" ------------------------------------------------------------------------------------------------------------------ No results for input(s): "TSH", "T4TOTAL", "T3FREE", "THYROIDAB" in the last 72 hours.  Invalid input(s): "FREET3"  Cardiac Enzymes No results for input(s): "CKMB", "TROPONINI", "MYOGLOBIN" in the last 168 hours.  Invalid input(s): "CK" ------------------------------------------------------------------------------------------------------------------ No results found for: "BNP"  CBG: No results for input(s): "GLUCAP" in the last 168 hours.  Recent Results (from the past 240 hour(s))  Urine Culture     Status: None    Collection Time: 12/08/21  6:36 PM   Specimen: Urine, Random  Result Value Ref Range Status   Specimen Description   Final    URINE, RANDOM Performed at [facility] [address], [place], [place] [address]    Special Requests   Final    NONE Performed at [facility], [place] [address], [place], [place] [address]    Culture   Final    NO GROWTH Performed at [facility], [place] [address] [place], [place] [address]    Report Status 12/10/2021 FINAL  Final     Radiology Studies: No results found.   Marzetta Board, MD, PhD [facility]  Between 7 am - 7 pm I am available, please contact me via Amion (for emergencies) or Securechat (non urgent messages)  Between 7 pm - 7 am I am not available, please contact night coverage MD/APP via Amion

## 2021-12-11 LAB — CBC
HCT: 36.6 % — ABNORMAL LOW (ref 39.0–52.0)
Hemoglobin: 12.1 g/dL — ABNORMAL LOW (ref 13.0–17.0)
MCH: 30.7 pg (ref 26.0–34.0)
MCHC: 33.1 g/dL (ref 30.0–36.0)
MCV: 92.9 fL (ref 80.0–100.0)
Platelets: 144 10*3/uL — ABNORMAL LOW (ref 150–400)
RBC: 3.94 MIL/uL — ABNORMAL LOW (ref 4.22–5.81)
RDW: 13.3 % (ref 11.5–15.5)
WBC: 5.1 10*3/uL (ref 4.0–10.5)
nRBC: 0 % (ref 0.0–0.2)

## 2021-12-11 LAB — COMPREHENSIVE METABOLIC PANEL
ALT: 9 U/L (ref 0–44)
AST: 12 U/L — ABNORMAL LOW (ref 15–41)
Albumin: 2.9 g/dL — ABNORMAL LOW (ref 3.5–5.0)
Alkaline Phosphatase: 42 U/L (ref 38–126)
Anion gap: 5 (ref 5–15)
BUN: 15 mg/dL (ref 6–20)
CO2: 30 mmol/L (ref 22–32)
Calcium: 8.3 mg/dL — ABNORMAL LOW (ref 8.9–10.3)
Chloride: 105 mmol/L (ref 98–111)
Creatinine, Ser: 0.79 mg/dL (ref 0.61–1.24)
GFR, Estimated: >60 mL/min (ref >60)
Glucose, Bld: 159 mg/dL — ABNORMAL HIGH (ref 70–99)
Potassium: 3.8 mmol/L (ref 3.5–5.1)
Sodium: 140 mmol/L (ref 135–145)
Total Bilirubin: 1.1 mg/dL (ref 0.3–1.2)
Total Protein: 5.5 g/dL — ABNORMAL LOW (ref 6.5–8.1)

## 2021-12-11 LAB — MAGNESIUM: Magnesium: 1.7 mg/dL (ref 1.7–2.4)

## 2021-12-11 MED ORDER — OXYCODONE HCL 5 MG PO TABS
10.0000 mg | ORAL_TABLET | ORAL | Status: DC | PRN
  Administered 2021-12-11 – 2021-12-12 (×4): 10 mg via ORAL
  Filled 2021-12-11 (×3): qty 2

## 2021-12-11 NOTE — Progress Notes (Signed)
PROGRESS NOTE  Glen MONTELLANO Sr. PZW:[id number] DOB: Mar 25, 1965 DOA: 12/09/2021 PCP: Patient, No Pcp Per   LOS: 1 day   Brief Narrative / Interim history:  56 y.o. male with medical history significant of back pain, sciatica, nephrolithiasis who is coming to the emergency via EMS due to left flank pain secondary to nephrolithiasis.  He was seen in the ED yesterday, but left AMA.  He told the staff in triage that he was prescribed medications but was unable to obtain them.  But he was unable to pick them up.  He is supposed to have a follow-up with urology.  Subjective / 24h Interval events: He spoke with urology yesterday, could technically go home and come back for surgery tomorrow, he will need to have stent placement.  Patient however has no way to get from his home to the surgical center tomorrow.  He continues to complain of left flank pain, requiring IV Dilaudid last night  Assesement and Plan: Principal Problem:   Renal colic on left side Active Problems:   Urolithiasis   Hydronephrosis of left kidney   Tobacco use   Hypokalemia   Hypercalcemia   Nephrolithiasis   Principal problem Obstructing nephrolithiasis-CT scan done on admission showed an obstructive 6 mm proximal left ureteral stone with mild left hydronephrosis.  Continues to have left flank pain, severe at times, required IV Dilaudid last night. -Urology consulted, will take patient to the OR tomorrow for left ureteroscopy, laser lithotripsy and stone removal, as well as right-sided diagnostic ureteroscopy.  As he continues to have pain and no way to transport from home to the surgical center, will keep here overnight.  Active problems Hypokalemia-potassium normalized after repletion.  Magnesium normal at 1.7  Anemia, thrombocytopenia-no bleeding, they look dilutional as he has received IV fluids.  Stable this morning  Tobacco use-nicotine replacement  Scheduled Meds:  nicotine  21 mg Transdermal Daily    tamsulosin  0.4 mg Oral Daily   Continuous Infusions:  lactated ringers 125 mL/hr at 12/11/21 0429   PRN Meds:.acetaminophen **OR** acetaminophen, dextrose, HYDROmorphone (DILAUDID) injection, ondansetron **OR** ondansetron (ZOFRAN) IV, oxyCODONE  Current Outpatient Medications  Medication Instructions   cyclobenzaprine (FLEXERIL) 10 mg, Oral, 2 times daily PRN   latanoprost (XALATAN) 0.005 % ophthalmic solution 1 drop, Daily at bedtime   oxyCODONE-acetaminophen (PERCOCET/ROXICET) 5-325 MG tablet 1 tablet, Oral, Every 6 hours PRN   Polyvinyl Alcohol (LIQUID TEARS OP) 3 drops, Daily PRN   senna-docusate (SENOKOT-S) 8.6-50 MG tablet 1 tablet, Oral, At bedtime PRN   timolol (TIMOPTIC) 0.25 % ophthalmic solution 1 drop, Left Eye, Daily at bedtime    Diet Orders (From admission, onward)     Start     Ordered   12/09/21 1255  Diet regular Room service appropriate? Yes; Fluid consistency: Thin  Diet effective now       Question Answer Comment  Room service appropriate? Yes   Fluid consistency: Thin      12/09/21 1254            DVT prophylaxis:    Lab Results  Component Value Date   PLT 144 (L) 12/11/2021      Code Status: Full Code  Family Communication: no family at bedside  Status is: Inpatient, persistent symptoms  Level of care: Med-Surg  Objective: Vitals:   12/10/21 0504 12/10/21 1329 12/10/21 2139 12/11/21 0445  BP: 120/75 124/77 113/75 115/76  Pulse: 66 72 73 68  Resp: '16 18 16 19  '$ Temp: 97.9 F (  36.6 C) 99 F (37.2 C) 98.4 F (36.9 C) 98.7 F (37.1 C)  TempSrc: Oral Oral Oral Oral  SpO2: 96% 96% 97% 98%  Weight:      Height:        Intake/Output Summary (Last 24 hours) at 12/11/2021 0954 Last data filed at 12/11/2021 0930 Gross per 24 hour  Intake 4632.84 ml  Output 5600 ml  Net -967.16 ml    Wt Readings from Last 3 Encounters:  12/09/21 83.9 kg  12/08/21 83.9 kg  04/26/13 91.3 kg    Examination:  Constitutional: NAD Eyes: lids and  conjunctivae normal, no scleral icterus ENMT: mmm Neck: normal, supple Respiratory: clear to auscultation bilaterally, no wheezing, no crackles.  Cardiovascular: Regular rate and rhythm, no murmurs / rubs / gallops. No LE edema. Abdomen: soft, no distention, no tenderness. Bowel sounds positive.  Skin: no rashes Neurologic: no focal deficits, equal strength   Data Reviewed: I have independently reviewed following labs and imaging studies   CBC Recent Labs  Lab 12/08/21 1415 12/09/21 0738 12/10/21 0414 12/11/21 0421  WBC 7.6 9.1 5.9 5.1  HGB 14.4 13.6 11.9* 12.1*  HCT 43.4 40.4 36.4* 36.6*  PLT 172 170 141* 144*  MCV 92.7 92.2 95.0 92.9  MCH 30.8 31.1 31.1 30.7  MCHC 33.2 33.7 32.7 33.1  RDW 13.6 13.5 13.8 13.3  LYMPHSABS  --  3.5  --   --   MONOABS  --  0.9  --   --   EOSABS  --  0.1  --   --   BASOSABS  --  0.1  --   --      Recent Labs  Lab 12/08/21 1714 12/09/21 0738 12/10/21 0414 12/11/21 0421  NA 139 142 139 140  K 3.7 3.1* 4.0 3.8  CL 106 108 109 105  CO2 '27 27 26 30  '$ GLUCOSE 103* 56* 135* 159*  BUN '15 18 19 15  '$ CREATININE 0.85 0.98 0.80 0.79  CALCIUM 8.4* 7.6* 7.9* 8.3*  AST 12*  --  11* 12*  ALT 11  --  9 9  ALKPHOS 52  --  42 42  BILITOT 1.3*  --  0.8 1.1  ALBUMIN 3.8  --  2.8* 2.9*  MG  --   --   --  1.7     ------------------------------------------------------------------------------------------------------------------ No results for input(s): "CHOL", "HDL", "LDLCALC", "TRIG", "CHOLHDL", "LDLDIRECT" in the last 72 hours.  No results found for: "HGBA1C" ------------------------------------------------------------------------------------------------------------------ No results for input(s): "TSH", "T4TOTAL", "T3FREE", "THYROIDAB" in the last 72 hours.  Invalid input(s): "FREET3"  Cardiac Enzymes No results for input(s): "CKMB", "TROPONINI", "MYOGLOBIN" in the last 168 hours.  Invalid input(s):  "CK" ------------------------------------------------------------------------------------------------------------------ No results found for: "BNP"  CBG: No results for input(s): "GLUCAP" in the last 168 hours.  Recent Results (from the past 240 hour(s))  Urine Culture     Status: None   Collection Time: 12/08/21  6:36 PM   Specimen: Urine, Random  Result Value Ref Range Status   Specimen Description   Final    URINE, RANDOM Performed at [facility] [address], [place], [place] [address]    Special Requests   Final    NONE Performed at [facility], [place] [address], [place], [place] [address]    Culture   Final    NO GROWTH Performed at [facility], [place] [address], [place], [place] [address]    Report Status 12/10/2021 FINAL  Final  Radiology Studies: US RENAL  Result Date: 12/10/2021 CLINICAL DATA:  Flank pain EXAM: RENAL / URINARY TRACT ULTRASOUND COMPLETE COMPARISON:  Renal stone protocol CT 12/08/2021 FINDINGS: Right Kidney: Renal measurements: 13.7 x 7.2 x 4.9 cm = volume: 256 mL. Moderate right hydronephrosis. No cortical thinning. No mass. Left Kidney: Renal measurements: 13.0 x 5.8 x 5.2 cm = volume: 204 mL. Mild right hydronephrosis. Normal cortical thickness. No mass. Bladder: Appears normal for degree of bladder distention. Other: None. IMPRESSION: Unchanged moderate right and mild left hydronephrosis. Electronically Signed   By: Miachel Roux M.D.   On: 12/10/2021 16:44     Marzetta Board, MD, PhD [facility]  Between 7 am - 7 pm I am available, please contact me via Amion (for emergencies) or Securechat (non urgent messages)  Between 7 pm - 7 am I am not available, please contact night coverage MD/APP via Amion

## 2021-12-11 NOTE — Anesthesia Preprocedure Evaluation (Signed)
Anesthesia Evaluation  Patient identified by MRN, date of birth, ID band Patient awake    Reviewed: Allergy & Precautions, NPO status   Airway Mallampati: II       Dental   Pulmonary Current Smoker and Patient abstained from smoking.,    breath sounds clear to auscultation       Cardiovascular  Rhythm:Regular Rate:Normal     Neuro/Psych  Neuromuscular disease    GI/Hepatic negative GI ROS, Neg liver ROS,   Endo/Other    Renal/GU Renal disease     Musculoskeletal   Abdominal   Peds  Hematology   Anesthesia Other Findings   Reproductive/Obstetrics                            Anesthesia Physical Anesthesia Plan  ASA: 3  Anesthesia Plan: General   Post-op Pain Management:    Induction: Intravenous  PONV Risk Score and Plan: 2 and Ondansetron, Midazolam and Dexamethasone  Airway Management Planned: LMA  Additional Equipment:   Intra-op Plan:   Post-operative Plan: Extubation in OR  Informed Consent: I have reviewed the patients History and Physical, chart, labs and discussed the procedure including the risks, benefits and alternatives for the proposed anesthesia with the patient or authorized representative who has indicated his/her understanding and acceptance.     Dental advisory given  Plan Discussed with: Anesthesiologist and CRNA  Anesthesia Plan Comments:        Anesthesia Quick Evaluation

## 2021-12-12 ENCOUNTER — Encounter (HOSPITAL_COMMUNITY): Payer: Self-pay | Admitting: Internal Medicine

## 2021-12-12 ENCOUNTER — Inpatient Hospital Stay (HOSPITAL_COMMUNITY): Payer: Self-pay | Admitting: Certified Registered Nurse Anesthetist

## 2021-12-12 ENCOUNTER — Inpatient Hospital Stay (HOSPITAL_COMMUNITY): Payer: Self-pay

## 2021-12-12 ENCOUNTER — Encounter (HOSPITAL_COMMUNITY)
Admission: EM | Disposition: A | Discharge status: Home / Self Care | Payer: Self-pay | Source: Home / Self Care | Attending: Internal Medicine

## 2021-12-12 DIAGNOSIS — N132 Hydronephrosis with renal and ureteral calculous obstruction: Secondary | ICD-10-CM

## 2021-12-12 DIAGNOSIS — N289 Disorder of kidney and ureter, unspecified: Secondary | ICD-10-CM

## 2021-12-12 DIAGNOSIS — N2889 Other specified disorders of kidney and ureter: Secondary | ICD-10-CM

## 2021-12-12 HISTORY — PX: CYSTOSCOPY W/ URETERAL STENT PLACEMENT: SHX1429

## 2021-12-12 LAB — MRSA NEXT GEN BY PCR, NASAL: MRSA by PCR Next Gen: NOT DETECTED

## 2021-12-12 SURGERY — CYSTOSCOPY, WITH RETROGRADE PYELOGRAM AND URETERAL STENT INSERTION
Anesthesia: General | Laterality: Bilateral

## 2021-12-12 MED ORDER — ONDANSETRON HCL 4 MG/2ML IJ SOLN
INTRAMUSCULAR | Status: AC
  Filled 2021-12-12: qty 2

## 2021-12-12 MED ORDER — OXYCODONE HCL 10 MG PO TABS: 10.0000 mg | ORAL_TABLET | ORAL | 0 refills | Status: DC | PRN

## 2021-12-12 MED ORDER — DEXAMETHASONE SODIUM PHOSPHATE 10 MG/ML IJ SOLN
INTRAMUSCULAR | Status: DC | PRN
  Administered 2021-12-12: 5 mg via INTRAVENOUS

## 2021-12-12 MED ORDER — IOHEXOL 300 MG/ML  SOLN
INTRAMUSCULAR | Status: DC | PRN
  Administered 2021-12-12: 56 mL

## 2021-12-12 MED ORDER — TAMSULOSIN HCL 0.4 MG PO CAPS: 0.4000 mg | ORAL_CAPSULE | Freq: Every day | ORAL | 1 refills | Status: DC

## 2021-12-12 MED ORDER — DEXAMETHASONE SODIUM PHOSPHATE 10 MG/ML IJ SOLN
INTRAMUSCULAR | Status: AC
  Filled 2021-12-12: qty 1

## 2021-12-12 MED ORDER — FENTANYL CITRATE (PF) 100 MCG/2ML IJ SOLN
INTRAMUSCULAR | Status: AC
  Filled 2021-12-12: qty 2

## 2021-12-12 MED ORDER — LACTATED RINGERS IV SOLN: INTRAVENOUS | Status: DC

## 2021-12-12 MED ORDER — MIDAZOLAM HCL 2 MG/2ML IJ SOLN
INTRAMUSCULAR | Status: AC
  Filled 2021-12-12: qty 2

## 2021-12-12 MED ORDER — PROPOFOL 10 MG/ML IV BOLUS
INTRAVENOUS | Status: DC | PRN
  Administered 2021-12-12: 200 mg via INTRAVENOUS

## 2021-12-12 MED ORDER — LATANOPROST 0.005 % OP SOLN
1.0000 [drp] | Freq: Every day | OPHTHALMIC | Status: DC
  Filled 2021-12-12: qty 2.5

## 2021-12-12 MED ORDER — HYDROXYZINE PAMOATE 25 MG PO CAPS: 25.0000 mg | ORAL_CAPSULE | Freq: Three times a day (TID) | ORAL | 0 refills | Status: DC | PRN

## 2021-12-12 MED ORDER — 0.9 % SODIUM CHLORIDE (POUR BTL) OPTIME
TOPICAL | Status: DC | PRN
  Administered 2021-12-12: 1000 mL

## 2021-12-12 MED ORDER — ORAL CARE MOUTH RINSE: 15.0000 mL | Freq: Once | OROMUCOSAL | Status: AC

## 2021-12-12 MED ORDER — PROPOFOL 10 MG/ML IV BOLUS
INTRAVENOUS | Status: AC
  Filled 2021-12-12: qty 20

## 2021-12-12 MED ORDER — FENTANYL CITRATE (PF) 100 MCG/2ML IJ SOLN
INTRAMUSCULAR | Status: DC | PRN
  Administered 2021-12-12 (×2): 50 ug via INTRAVENOUS

## 2021-12-12 MED ORDER — LIDOCAINE 2% (20 MG/ML) 5 ML SYRINGE
INTRAMUSCULAR | Status: DC | PRN
  Administered 2021-12-12: 60 mg via INTRAVENOUS

## 2021-12-12 MED ORDER — EPHEDRINE SULFATE-NACL 50-0.9 MG/10ML-% IV SOSY
PREFILLED_SYRINGE | INTRAVENOUS | Status: DC | PRN
  Administered 2021-12-12: 5 mg via INTRAVENOUS
  Administered 2021-12-12: 10 mg via INTRAVENOUS

## 2021-12-12 MED ORDER — SODIUM CHLORIDE 0.9 % IR SOLN
Status: DC | PRN
  Administered 2021-12-12: 3000 mL

## 2021-12-12 MED ORDER — OXYCODONE HCL 5 MG PO TABS
10.0000 mg | ORAL_TABLET | ORAL | Status: DC
  Filled 2021-12-12: qty 2

## 2021-12-12 MED ORDER — LIDOCAINE HCL (PF) 2 % IJ SOLN
INTRAMUSCULAR | Status: AC
  Filled 2021-12-12: qty 5

## 2021-12-12 MED ORDER — ONDANSETRON HCL 4 MG/2ML IJ SOLN
INTRAMUSCULAR | Status: DC | PRN
  Administered 2021-12-12: 4 mg via INTRAVENOUS

## 2021-12-12 MED ORDER — CIPROFLOXACIN IN D5W 400 MG/200ML IV SOLN
400.0000 mg | Freq: Once | INTRAVENOUS | Status: AC
  Administered 2021-12-12: 400 mg via INTRAVENOUS
  Filled 2021-12-12: qty 200

## 2021-12-12 MED ORDER — MIDAZOLAM HCL 5 MG/5ML IJ SOLN
INTRAMUSCULAR | Status: DC | PRN
  Administered 2021-12-12: 2 mg via INTRAVENOUS

## 2021-12-12 MED ORDER — CHLORHEXIDINE GLUCONATE 0.12 % MT SOLN
15.0000 mL | Freq: Once | OROMUCOSAL | Status: AC
  Administered 2021-12-12: 15 mL via OROMUCOSAL

## 2021-12-12 MED ORDER — HYDROMORPHONE HCL 1 MG/ML IJ SOLN: 0.2500 mg | INTRAMUSCULAR | Status: DC | PRN

## 2021-12-12 MED ORDER — KETOROLAC TROMETHAMINE 30 MG/ML IJ SOLN
30.0000 mg | Freq: Once | INTRAMUSCULAR | Status: AC
  Administered 2021-12-12: 30 mg via INTRAVENOUS
  Filled 2021-12-12: qty 1

## 2021-12-12 SURGICAL SUPPLY — 18 items
BAG URO CATCHER STRL LF (MISCELLANEOUS) ×1 IMPLANT
BASKET ZERO TIP NITINOL 2.4FR (BASKET) IMPLANT
BSKT STON RTRVL ZERO TP 2.4FR (BASKET)
CATH URETL OPEN 5X70 (CATHETERS) ×1 IMPLANT
CLOTH BEACON ORANGE TIMEOUT ST (SAFETY) ×1 IMPLANT
EXTRACTOR STONE 1.7FRX115CM (UROLOGICAL SUPPLIES) IMPLANT
GLOVE SURG LX STRL 7.5 STRW (GLOVE) ×1 IMPLANT
GOWN STRL REUS W/ TWL XL LVL3 (GOWN DISPOSABLE) ×1 IMPLANT
GOWN STRL REUS W/TWL XL LVL3 (GOWN DISPOSABLE) ×1
GUIDEWIRE ANG ZIPWIRE 038X150 (WIRE) IMPLANT
GUIDEWIRE STR DUAL SENSOR (WIRE) ×1 IMPLANT
MANIFOLD NEPTUNE II (INSTRUMENTS) ×1 IMPLANT
PACK CYSTO (CUSTOM PROCEDURE TRAY) ×1 IMPLANT
SHEATH NAVIGATOR HD 12/14X28 (SHEATH) IMPLANT
SHEATH NAVIGATOR HD 12/14X36 (SHEATH) IMPLANT
STENT URET 6FRX26 CONTOUR (STENTS) IMPLANT
TUBING CONNECTING 10 (TUBING) ×1 IMPLANT
TUBING UROLOGY SET (TUBING) ×1 IMPLANT

## 2021-12-12 NOTE — Interval H&P Note (Signed)
History and Physical Interval Note:  12/12/2021 8:20 AM  Glen Austria Sr.  has presented today for surgery, with the diagnosis of BILATERAL HYDRONEPHROSIS.  The various methods of treatment have been discussed with the patient and family. After consideration of risks, benefits and other options for treatment, the patient has consented to  Procedure(s): CYSTOSCOPY WITH RETROGRADE PYELOGRAM/URETERAL STENT PLACEMENT WITH URETEROSCOPY. BILATERAL (Bilateral) as a surgical intervention.  [name] history has been reviewed, patient examined, no change in status, stable for surgery.  I have reviewed the patient's chart and labs.  Questions were answered to the patient's satisfaction.     Ardis Hughs

## 2021-12-12 NOTE — Progress Notes (Signed)
Chaplain received a referral from [facility] to provide support after Juanda Crumble received a difficult diagnosis.  Glen Mcmillan is relying on his faith to help him through this new diagnosis.  He is drawing on strength that he found at other difficult times in his life.  Chaplain provided listening and spiritual support as he shared about all he has been through with his eyesight and his sobriety.   [address] [place] Pager, [phone]

## 2021-12-12 NOTE — Plan of Care (Signed)

## 2021-12-12 NOTE — Op Note (Signed)
Preoperative diagnosis:  Left proximal ureteral stone Right hydroureteronephrosis  Postoperative diagnosis:  Left proximal ureteral stone Right mid ureteral mass, consistent with transitional cell carcinoma  Procedure: Cystoscopy, bilateral retrograde pyelogram with interpretation Bilateral ureteral stent placement Bilateral ureteral balloon dilation Left ureteroscopy, laser lithotripsy and stone removal Right diagnostic ureteroscopy and ureteral mass biopsy  Surgeon: Ardis Hughs, MD  Anesthesia: General  Complications: None  Intraoperative findings:  #1: [name] left retrograde pyelogram demonstrated normal caliber ureter with a filling defect at the UPJ/proximal ureter with associated hydronephrosis.  There were no additional filling defects.  The stone was ultimately pushed into the lower pole of the kidney which I moved to the upper pole and fragmented and removed in entirety. #2: [name] right retrograde pyelogram demonstrated normal caliber right distal ureter with a goblet sign and large filling defect in the midportion with significant tortuosity and severe proximal hydroureteronephrosis. #3: Right ureteroscopy demonstrated a large mass emanating from the lateral sidewall of the ureter.  This was consistent with transitional cell carcinoma, it was biopsied.  EBL: Minimal  Specimens: #1: Left ureteral stones #2: Right ureteral biopsy #3: Right renal pelvic cytology  Indication: Glen MOORE Sr. is a 56 y.o. patient with left flank pain secondary to a large left proximal ureteral stone.  He also was noted to have right-sided hydroureteronephrosis..  After reviewing the management options for treatment, he elected to proceed with the above surgical procedure(s). We have discussed the potential benefits and risks of the procedure, side effects of the proposed treatment, the likelihood of the patient achieving the goals of the procedure, and any potential  problems that might occur during the procedure or recuperation. Informed consent has been obtained.  Description of procedure:  The patient was taken to the operating room and general anesthesia was induced.  The patient was placed in the dorsal lithotomy position, prepped and draped in the usual sterile fashion, and preoperative antibiotics were administered. A preoperative time-out was performed.   21 French 0 degree cystoscope was gently passed through the patient's urethra and into the patient's bladder under visual guidance.  Cystoscopy demonstrated no significant abnormalities within the bladder itself.  I cannulated the patient's left ureteral orifice with a 5 [place] abated ureteral catheter and performed retrograde pyelogram the above findings.  I subsequently advanced a wire through the open-ended catheter and up into the left renal pelvis removing the catheter over the wire.  The bladder was then drained and I exchanged the cystoscope for a semirigid ureteroscope.  I was able to advance the ureteroscope up to the mid ureter and then unable to advance it beyond this area.  As such, I advanced a second wire up through the scope and remove the scope over the wire.  I then advanced a 15 French x10 cm ureteral balloon dilator up to the area of the stenosis.  I inflated the balloon and left it there for 60 seconds.  I then slowly deflated the balloon and pulled it back to the UVJ region and dilated up again.  I then advanced a 12/14 [place] x36 cm ureteral access sheath up over the second wire and into the midportion of the ureter crossing over the stenosed area.  I then remove the inner portion of the sheath as well as a safety wire.  I advanced the flexible ureteroscope up into through the sheath and into the left renal pelvis.  I encountered the stone in the lower portion of the kidney.  With  the engage basket I moved the stone to the upper portion and using a 200 m laser fiber with settings of 1 J and  15 Hz I fragmented the stone into smaller pieces that I subsequently was able to remove with the engage basket.  I reinspected the renal pelvis and calyces as well as the ureter removing the access sheath simultaneously noting no significant ureteral trauma or additional stone fragments.  The wire was then left in the left ureter and I repassed the cystoscope and turned my attention to the patient's right ureter.  I performed retrograde pyelogram with the above findings.  I did have some difficulty getting contrast beyond the area of obstruction and advanced the catheter up to this area and really performed the retrograde pyelogram.  I then advanced a wire up through the scope and up into the right renal pelvis.  I then removed the cystoscope and again advanced the balloon dilator over the wire and into the distal ureter/UVJ region.  I dilated this area for 60 seconds and then deflated the balloon and remove the balloon dilator.  I then advanced a semirigid ureteroscope up through the urethra and cannulated the right ureter without difficulty.  Once I got to the renal pelvis I did have some difficulty crossing the stenosed area.  I was able to push through this area and encountered the large mass.  Using a 0 tip basket I snared a big part of this tumor and was able to remove it and sent it for right mid ureter mass.  I then repassed the ureteroscope up into the proximal ureter bypassing the mass.  The mass was noted to be about 1.5 cm in length.  I did not see any additional tumors along the ureter or within the renal pelvis.  I did send a urine cytology from the right renal pelvis.  I then slowly backed out the scope and reinspected the area.  I took some pictures of the tumor.  I then backloaded the safety wire with the cystoscope and passed a 26 cm time 6 French double-J ureteral stent over the wire and into the right renal pelvis noting a nice curl within the pelvis prior to removing the wire and noting a good  curl within the patient's bladder.  I then repassed the wire emanating from the patient's left ureteral orifice through the scope, back loading it and repassed the scope into the patient's bladder and advanced a 26 cm time 6 French double-J stent in the patient's left ureter.  Once it was noted to be well within the left renal pelvis I backed out the scope to the bladder neck and advanced the stent into the bladder before removing the wire.  Again a nice curl was noted well within the renal pelvis under fluoroscopic guidance.  The bladder subsequently emptied.  The patient was then awoken, extubated, and returned the PACU in stable condition.  Ardis Hughs, M.D.

## 2021-12-12 NOTE — TOC Progression Note (Signed)
Transition of Care (TOC) - Progression Note    Patient Details  Name: Glen SEPPALA Sr. MRN: [id number] Date of Birth: Feb 26, 1966  Transition of Care [facility]) CM/SW Contact  Purcell Mouton, RN Phone Number: 12/12/2021, 2:08 PM  Clinical Narrative:    Spoke with pt who asked for a ride home to [place] and help with his medications.    Expected Discharge Plan: Home/Self Care Barriers to Discharge: Continued Medical Work up  Expected Discharge Plan and Services Expected Discharge Plan: Home/Self Care   Discharge Planning Services: CM Consult   Living arrangements for the past 2 months: Single Family Home (from [place].)                                       Social Determinants of Health (SDOH) Interventions    Readmission Risk Interventions     No data to display

## 2021-12-12 NOTE — Consult Note (Signed)
The patient was taken to the operating room today for removal of his left proximal ureteral stone.  This was uncomplicated and the stone was removed entirely and a stent placed in his left ureter.  We evaluated the patient's right hydro ureteronephrosis and noted that he had a large ureteral mass in the midportion of the ureter.  I did send a ureteral mass biopsy, and will have to contact the patient when I get these results back.  In addition he has a stent on the right as well.  The patient can be discharged home today.  He does have bilateral ureteral stents which we will remove at some point in the near future.  He will need additional treatment for the right ureteral mass, and I will follow-up with him to discuss this in more detail.  The patient would benefit from tramadol 50 to 100 mg every 6 hours as needed for pain.  I would also recommend that he be discharged with tamsulosin for 30 days.

## 2021-12-12 NOTE — Transfer of Care (Signed)
Immediate Anesthesia Transfer of Care Note  Patient: Glen Austria Sr.  Procedure(s) Performed: CYSTOSCOPY WITH BILATERAL RETROGRADE PYELOGRAM/URETERAL DILATION/URETEROSCOPY/URETERAL STENT PLACEMENT/RIGHT DIAGNOSTIC URETEROSCOPY, URETERAL BIOPSY;LEFT LASER LITHOTRISPY/STONE EXTRACTION (Bilateral)  Patient Location: PACU  Anesthesia Type:General  Level of Consciousness: awake, alert  and patient cooperative  Airway & Oxygen Therapy: Patient Spontanous Breathing and Patient connected to face mask oxygen  Post-op Assessment: Report given to RN and Post -op Vital signs reviewed and stable  Post vital signs: Reviewed and stable  Last Vitals:  Vitals Value Taken Time  BP 146/89 12/12/21 1037  Temp    Pulse 90 12/12/21 1038  Resp 17 12/12/21 1038  SpO2 100 % 12/12/21 1038  Vitals shown include unvalidated device data.  Last Pain:  Vitals:   12/12/21 0757  TempSrc:   PainSc: 3       Patients Stated Pain Goal: 0 (41/58/30 9407)  Complications: No notable events documented.

## 2021-12-12 NOTE — TOC Progression Note (Signed)
Transition of Care (TOC) - Progression Note    Patient Details  Name: Glen Mcmillan Sr. MRN: [id number] Date of Birth: 03-20-1965  Transition of Care [facility]) CM/SW Contact  Purcell Mouton, RN Phone Number: 12/12/2021, 2:38 PM  Clinical Narrative:    [place] letter was explained and given to pt on the 9th. An update Match was placed in pt's chart. RN will provide pt with [employer] number [phone]. Pt is living with a friend at present time. Pt is very tearful. Asked pt if he would like to talk with a Chaplain. Pt states, " yes."    Expected Discharge Plan: Home/Self Care Barriers to Discharge: Continued Medical Work up  Expected Discharge Plan and Services Expected Discharge Plan: Home/Self Care   Discharge Planning Services: CM Consult   Living arrangements for the past 2 months: Single Family Home (from [place].)                                       Social Determinants of Health (SDOH) Interventions    Readmission Risk Interventions     No data to display

## 2021-12-12 NOTE — Discharge Summary (Signed)
Physician Discharge Summary  Glen TIEGS Sr. HCW:[id number] DOB: 1965/03/17 DOA: 12/09/2021  PCP: Patient, No Pcp Per  Admit date: 12/09/2021 Discharge date: 12/12/2021  Admitted From: Home Disposition: Home  Recommendations for Outpatient Follow-up:  Follow up with PCP in 1-2 weeks Please obtain BMP/CBC in one week Plan to follow-up with urology as an outpatient to discuss removal of stents, follow-up ureteral mass biopsy and further plans regarding management of ureteral mass   Discharge Condition: Stable CODE STATUS: Full code Diet recommendation: Heart healthy  Brief/Interim Summary: 56 year old male with a history of back pain, nephrolithiasis, presented to the emergency room with left flank pain secondary to nephrolithiasis.  Imaging done in the hospital showed obstructive 6 mm proximal left ureteral stone with mild left hydronephrosis.  He received IV fluids and pain management.  He was seen by urology and underwent cystoscopy with uncomplicated stone removal.  Had stent placed in the left ureter.  Right ureter was also explored and he was noted to have a right ureteral mass.  This was biopsied and stent was placed on the right as well.  He did have some hematuria after the procedure which would be expected.  Urology plans on following up biopsy results and communicating further plan of care with patient on follow-up.  It was recommended that he continue on tamsulosin daily as well as pain management.  This has been provided to the patient.  He is tolerating his diet without any vomiting.  He is otherwise stable for discharge home.  He received a voucher to obtain his medications.  Discharge Diagnoses:  Principal Problem:   Renal colic on left side Active Problems:   Urolithiasis   Hydronephrosis of left kidney   Tobacco use   Hypokalemia   Hypercalcemia   Nephrolithiasis   Ureteral mass    Discharge Instructions  Discharge Instructions     Diet - low sodium heart  healthy   Complete by: As directed    Increase activity slowly   Complete by: As directed       Allergies as of 12/12/2021       Reactions   Sulfa Antibiotics Itching   Zz System Cleanup For Nka Flag    No Known Allergies Flag corrected after system error with merged record. Doy Mince        Medication List     STOP taking these medications    oxyCODONE-acetaminophen 5-325 MG tablet Commonly known as: PERCOCET/ROXICET       TAKE these medications    hydrOXYzine 25 MG capsule Commonly known as: Vistaril Take 1 capsule (25 mg total) by mouth 3 (three) times daily as needed for anxiety.   latanoprost 0.005 % ophthalmic solution Commonly known as: XALATAN 1 drop at bedtime.   LIQUID TEARS OP Place 3 drops into the right eye daily as needed (dryness).   Oxycodone HCl 10 MG Tabs Take 1 tablet (10 mg total) by mouth every 4 (four) hours as needed for moderate pain or breakthrough pain.   senna-docusate 8.6-50 MG tablet Commonly known as: Senokot-S Take 1 tablet by mouth at bedtime as needed for mild constipation.   tamsulosin 0.4 MG Caps capsule Commonly known as: FLOMAX Take 1 capsule (0.4 mg total) by mouth daily. Start taking on: December 13, 2021   timolol 0.25 % ophthalmic solution Commonly known as: TIMOPTIC Place 1 drop into the left eye at bedtime.        Follow-up [facility] Follow up.   Why: Please call and schedule an appointment to establish a primary MD they will work with you Contact information: [address]  [place] [address] [phone]        Ardis Hughs, MD Follow up.   Specialty: Urology Contact information: [place] [address] [phone]                Allergies  Allergen Reactions   Sulfa Antibiotics Itching   Zz System Cleanup For Nka Flag     No Known Allergies Flag corrected after system error  with merged record. Doy Mince     Consultations: Urology   Procedures/Studies: DG C-Arm 1-60 Min-No Report  Result Date: 12/12/2021 Fluoroscopy was utilized by the requesting physician.  No radiographic interpretation.   DG C-Arm 1-60 Min-No Report  Result Date: 12/12/2021 Fluoroscopy was utilized by the requesting physician.  No radiographic interpretation.   US RENAL  Result Date: 12/10/2021 CLINICAL DATA:  Flank pain EXAM: RENAL / URINARY TRACT ULTRASOUND COMPLETE COMPARISON:  Renal stone protocol CT 12/08/2021 FINDINGS: Right Kidney: Renal measurements: 13.7 x 7.2 x 4.9 cm = volume: 256 mL. Moderate right hydronephrosis. No cortical thinning. No mass. Left Kidney: Renal measurements: 13.0 x 5.8 x 5.2 cm = volume: 204 mL. Mild right hydronephrosis. Normal cortical thickness. No mass. Bladder: Appears normal for degree of bladder distention. Other: None. IMPRESSION: Unchanged moderate right and mild left hydronephrosis. Electronically Signed   By: Miachel Roux M.D.   On: 12/10/2021 16:44   CT Renal Stone Study  Result Date: 12/08/2021 CLINICAL DATA:  Left flank pain EXAM: CT ABDOMEN AND PELVIS WITHOUT CONTRAST TECHNIQUE: Multidetector CT imaging of the abdomen and pelvis was performed following the standard protocol without IV contrast. RADIATION DOSE REDUCTION: This exam was performed according to the departmental dose-optimization program which includes automated exposure control, adjustment of the mA and/or kV according to patient size and/or use of iterative reconstruction technique. COMPARISON:  08/17/2009 FINDINGS: Lower chest: No acute abnormality Hepatobiliary: No focal hepatic abnormality. Gallbladder unremarkable. Pancreas: No focal abnormality or ductal dilatation. Spleen: No focal abnormality.  Normal size. Adrenals/Urinary Tract: Bilateral hydronephrosis. Left hydronephrosis is due to 6 mm proximal left ureteral stone. Right hydronephrosis extends to just above the  pelvic brim. There is no visible calcified stone in this area. Slight soft tissue density appreciated. Cannot exclude obstructing urothelial lesion. Punctate nonobstructing stone in the upper pole of the right kidney. Adrenal glands and urinary bladder unremarkable. Stomach/Bowel: Left colonic diverticulosis. No active diverticulitis. Stomach and small bowel decompressed. Vascular/Lymphatic: Scattered aortic calcifications. No evidence of aneurysm or adenopathy. Reproductive: No visible focal abnormality. Other: No free fluid or free air. Small bilateral inguinal hernias containing fat. Musculoskeletal: No acute bony abnormality. 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 IMPRESSION: Obstructing 6 mm proximal left ureteral stone with mild left hydronephrosis. Right nephrolithiasis. Right hydronephrosis to the level of the pelvic brim. No visible obstructing stone in this area. Cannot exclude obstructing urothelial lesion. Recommend urologic consultation for further evaluation. Aortic atherosclerosis. Left colonic diverticulosis. Electronically Signed   By: Rolm Baptise M.D.   On: 12/08/2021 14:11   DG Chest 2 View  Result Date: 12/08/2021 CLINICAL DATA:  Chest pain EXAM: CHEST - 2 VIEW COMPARISON:  None Available. FINDINGS: The heart size and mediastinal contours are within normal limits. Both lungs are clear. The visualized skeletal structures are unremarkable. IMPRESSION: No acute cardiopulmonary abnormality.  Electronically Signed   By: Beryle Flock M.D.   On: 12/08/2021 13:21      Subjective: Tolerating diet without nausea or vomiting.  Continues to have back pain.  Discharge Exam: Vitals:   12/12/21 1142 12/12/21 1245 12/12/21 1348 12/12/21 1450  BP: (!) 150/89 (!) 139/91 130/82 122/81  Pulse: 75 79 83 91  Resp: '14 18 18 17  '$ Temp: 98.3 F (36.8 C) 97.6 F (36.4 C) 98.1 F (36.7 C) 98.3 F (36.8 C)  TempSrc:  Oral Oral Oral  SpO2: 99% 100% 100% 99%  Weight:       Height:        General: Pt is alert, awake, not in acute distress Cardiovascular: RRR, S1/S2 +, no rubs, no gallops Respiratory: CTA bilaterally, no wheezing, no rhonchi Abdominal: Soft, NT, ND, bowel sounds + Extremities: no edema, no cyanosis    The results of significant diagnostics from this hospitalization (including imaging, microbiology, ancillary and laboratory) are listed below for reference.     Microbiology: Recent Results (from the past 240 hour(s))  Urine Culture     Status: None   Collection Time: 12/08/21  6:36 PM   Specimen: Urine, Random  Result Value Ref Range Status   Specimen Description   Final    URINE, RANDOM Performed at [facility] [address] [place], [place] [address]    Special Requests   Final    NONE Performed at [facility], [place] [address] [place], [place] [address]    Culture   Final    NO GROWTH Performed at [facility], [place] [address], [place], [place] [address]    Report Status 12/10/2021 FINAL  Final  MRSA Next Gen by PCR, Nasal     Status: None   Collection Time: 12/12/21  5:23 AM   Specimen: Nasal Mucosa; Nasal Swab  Result Value Ref Range Status   MRSA by PCR Next Gen NOT DETECTED NOT DETECTED Final    Comment: (NOTE) The GeneXpert MRSA Assay (FDA approved for NASAL specimens only), is one component of a comprehensive MRSA colonization surveillance program. It is not intended to diagnose MRSA infection nor to guide or monitor treatment for MRSA infections. Test performance is not FDA approved in patients less than 92 years old. Performed at [facility], [place] [address], [place], [place] [address]      Labs: BNP (last 3 results) No results for input(s): "BNP" in the last 8760 hours. Basic Metabolic Panel: Recent Labs  Lab 12/08/21 1714 12/09/21 0738 12/10/21 0414 12/11/21 0421  NA 139 142 139 140  K 3.7 3.1* 4.0 3.8  CL 106 108 109 105  CO2 '27  27 26 30  '$ GLUCOSE 103* 56* 135* 159*  BUN '15 18 19 15  '$ CREATININE 0.85 0.98 0.80 0.79  CALCIUM 8.4* 7.6* 7.9* 8.3*  MG  --   --   --  1.7   Liver Function Tests: Recent Labs  Lab 12/08/21 1714 12/10/21 0414 12/11/21 0421  AST 12* 11* 12*  ALT '11 9 9  '$ ALKPHOS 52 42 42  BILITOT 1.3* 0.8 1.1  PROT 6.7 5.3* 5.5*  ALBUMIN 3.8 2.8* 2.9*   Recent Labs  Lab 12/08/21 1714  LIPASE 25   No results for input(s): "AMMONIA" in the last 168 hours. CBC: Recent Labs  Lab 12/08/21 1415 12/09/21 0738 12/10/21 0414 12/11/21 0421  WBC 7.6 9.1 5.9 5.1  NEUTROABS  --  4.5  --   --  HGB 14.4 13.6 11.9* 12.1*  HCT 43.4 40.4 36.4* 36.6*  MCV 92.7 92.2 95.0 92.9  PLT 172 170 141* 144*   Cardiac Enzymes: No results for input(s): "CKTOTAL", "CKMB", "CKMBINDEX", "TROPONINI" in the last 168 hours. BNP: Invalid input(s): "POCBNP" CBG: No results for input(s): "GLUCAP" in the last 168 hours. D-Dimer No results for input(s): "DDIMER" in the last 72 hours. Hgb A1c No results for input(s): "HGBA1C" in the last 72 hours. Lipid Profile No results for input(s): "CHOL", "HDL", "LDLCALC", "TRIG", "CHOLHDL", "LDLDIRECT" in the last 72 hours. Thyroid function studies No results for input(s): "TSH", "T4TOTAL", "T3FREE", "THYROIDAB" in the last 72 hours.  Invalid input(s): "FREET3" Anemia work up No results for input(s): "VITAMINB12", "FOLATE", "FERRITIN", "TIBC", "IRON", "RETICCTPCT" in the last 72 hours. Urinalysis    Component Value Date/Time   COLORURINE YELLOW 12/08/2021 1836   APPEARANCEUR CLEAR 12/08/2021 1836   LABSPEC 1.015 12/08/2021 1836   PHURINE 5.0 12/08/2021 1836   GLUCOSEU NEGATIVE 12/08/2021 1836   HGBUR MODERATE (A) 12/08/2021 1836   BILIRUBINUR NEGATIVE 12/08/2021 1836   KETONESUR NEGATIVE 12/08/2021 1836   PROTEINUR NEGATIVE 12/08/2021 1836   NITRITE NEGATIVE 12/08/2021 1836   LEUKOCYTESUR TRACE (A) 12/08/2021 1836   Sepsis Labs Recent Labs  Lab 12/08/21 1415  12/09/21 0738 12/10/21 0414 12/11/21 0421  WBC 7.6 9.1 5.9 5.1   Microbiology Recent Results (from the past 240 hour(s))  Urine Culture     Status: None   Collection Time: 12/08/21  6:36 PM   Specimen: Urine, Random  Result Value Ref Range Status   Specimen Description   Final    URINE, RANDOM Performed at [facility], [place] [address], [place], [place] [address]    Special Requests   Final    NONE Performed at [facility], [place] [address] [place], [place] [address]    Culture   Final    NO GROWTH Performed at [facility], [place] [address] [place], [place] [address]    Report Status 12/10/2021 FINAL  Final  MRSA Next Gen by PCR, Nasal     Status: None   Collection Time: 12/12/21  5:23 AM   Specimen: Nasal Mucosa; Nasal Swab  Result Value Ref Range Status   MRSA by PCR Next Gen NOT DETECTED NOT DETECTED Final    Comment: (NOTE) The GeneXpert MRSA Assay (FDA approved for NASAL specimens only), is one component of a comprehensive MRSA colonization surveillance program. It is not intended to diagnose MRSA infection nor to guide or monitor treatment for MRSA infections. Test performance is not FDA approved in patients less than 50 years old. Performed at [facility], [place] [address] [place], [place] [address]      Time coordinating discharge: 38mns  SIGNED:   JKathie Dike MD  [facility] 12/12/2021, 9:32 PM   If 7PM-7AM, please contact night-coverage [email or web address]

## 2021-12-12 NOTE — Anesthesia Postprocedure Evaluation (Signed)
Anesthesia Post Note  Patient: Glen Austria Sr.  Procedure(s) Performed: CYSTOSCOPY WITH BILATERAL RETROGRADE PYELOGRAM/URETERAL DILATION/URETEROSCOPY/URETERAL STENT PLACEMENT/RIGHT DIAGNOSTIC URETEROSCOPY, URETERAL BIOPSY;LEFT LASER LITHOTRISPY/STONE EXTRACTION (Bilateral)     Patient location during evaluation: PACU Anesthesia Type: General Level of consciousness: awake Pain management: pain level controlled Respiratory status: spontaneous breathing Cardiovascular status: stable Postop Assessment: no apparent nausea or vomiting Anesthetic complications: no   No notable events documented.  Last Vitals:  Vitals:   12/12/21 1045 12/12/21 1100  BP: (!) 162/96   Pulse: 83 83  Resp: 13 15  Temp:    SpO2: 100% 96%    Last Pain:  Vitals:   12/12/21 1100  TempSrc:   PainSc: 0-No pain                 Halla Chopp

## 2021-12-12 NOTE — Anesthesia Procedure Notes (Signed)
Procedure Name: LMA Insertion Date/Time: 12/12/2021 8:35 AM  Performed by: West Pugh, CRNAPre-anesthesia Checklist: Patient identified, Emergency Drugs available, Suction available, Patient being monitored and Timeout performed Patient Re-evaluated:Patient Re-evaluated prior to induction Oxygen Delivery Method: Circle system utilized Preoxygenation: Pre-oxygenation with 100% oxygen Induction Type: IV induction LMA: LMA with gastric port inserted LMA Size: 4.0 Number of attempts: 1 Placement Confirmation: positive ETCO2 Tube secured with: Tape Dental Injury: Teeth and Oropharynx as per pre-operative assessment

## 2021-12-13 ENCOUNTER — Encounter (HOSPITAL_COMMUNITY): Payer: Self-pay | Admitting: Urology

## 2021-12-13 LAB — CYTOLOGY - NON PAP

## 2021-12-13 LAB — SURGICAL PATHOLOGY

## 2021-12-17 ENCOUNTER — Encounter: Payer: Self-pay | Admitting: *Deleted

## 2021-12-17 NOTE — Progress Notes (Signed)
Patient called regarding being uninsured and applying for Medicaid.  Patient currently not a patient a [facility], however advised he may apply for Medicaid at [facility] in person or via phone. Advised all uninsured patients receive an automatic 60% discount for services billed through [facility] health and may apply for an additional discount if denied Medicaid.  Advised should he become a [facility] patient, additional resources may be discussed at that time if available. He verbalized understanding.

## 2021-12-18 ENCOUNTER — Other Ambulatory Visit: Payer: Self-pay | Admitting: Urology

## 2021-12-19 LAB — CALCULI, WITH PHOTOGRAPH (CLINICAL LAB)
Calcium Oxalate Dihydrate: 10 %
Calcium Oxalate Monohydrate: 90 %
Weight Calculi: 115 mg

## 2022-01-07 ENCOUNTER — Encounter (HOSPITAL_BASED_OUTPATIENT_CLINIC_OR_DEPARTMENT_OTHER): Payer: Self-pay | Admitting: Urology

## 2022-01-08 NOTE — Progress Notes (Signed)
Unable to reach pt via phone for pre-op interview for surgery 01-09-2022 '@[email or web address]'$  by Dr Louis Meckel, his voice mailbox is full.  Notified OR scheduler for Dr Louis Meckel, Marlis Edelson.  Will continue to try.  If unable to reach pt will need medication and medical history updated.

## 2022-01-09 ENCOUNTER — Ambulatory Visit (HOSPITAL_BASED_OUTPATIENT_CLINIC_OR_DEPARTMENT_OTHER): Payer: Self-pay | Admitting: Anesthesiology

## 2022-01-09 ENCOUNTER — Other Ambulatory Visit: Payer: Self-pay

## 2022-01-09 ENCOUNTER — Other Ambulatory Visit (HOSPITAL_COMMUNITY): Payer: Self-pay

## 2022-01-09 ENCOUNTER — Encounter (HOSPITAL_BASED_OUTPATIENT_CLINIC_OR_DEPARTMENT_OTHER)
Admission: RE | Disposition: A | Discharge status: Home / Self Care | Payer: Self-pay | Source: Ambulatory Visit | Attending: Urology

## 2022-01-09 ENCOUNTER — Ambulatory Visit (HOSPITAL_BASED_OUTPATIENT_CLINIC_OR_DEPARTMENT_OTHER)
Admission: RE | Admit: 2022-01-09 | Discharge: 2022-01-09 | Disposition: A | Discharge status: Home / Self Care | Payer: Self-pay | Source: Ambulatory Visit | Attending: Urology | Admitting: Urology

## 2022-01-09 ENCOUNTER — Encounter (HOSPITAL_BASED_OUTPATIENT_CLINIC_OR_DEPARTMENT_OTHER): Payer: Self-pay | Admitting: Urology

## 2022-01-09 DIAGNOSIS — F17210 Nicotine dependence, cigarettes, uncomplicated: Secondary | ICD-10-CM | POA: Insufficient documentation

## 2022-01-09 DIAGNOSIS — Z01818 Encounter for other preprocedural examination: Secondary | ICD-10-CM

## 2022-01-09 DIAGNOSIS — N2889 Other specified disorders of kidney and ureter: Secondary | ICD-10-CM | POA: Insufficient documentation

## 2022-01-09 DIAGNOSIS — C661 Malignant neoplasm of right ureter: Secondary | ICD-10-CM | POA: Insufficient documentation

## 2022-01-09 DIAGNOSIS — C662 Malignant neoplasm of left ureter: Secondary | ICD-10-CM

## 2022-01-09 DIAGNOSIS — Z87442 Personal history of urinary calculi: Secondary | ICD-10-CM | POA: Insufficient documentation

## 2022-01-09 HISTORY — DX: Other chronic pain: G89.29

## 2022-01-09 HISTORY — PX: CYSTOSCOPY WITH STENT PLACEMENT: SHX5790

## 2022-01-09 HISTORY — DX: Personal history of urinary calculi: Z87.442

## 2022-01-09 HISTORY — PX: MASS EXCISION: SHX2000

## 2022-01-09 SURGERY — EXCISION MASS
Anesthesia: General | Site: Ureter | Laterality: Right

## 2022-01-09 MED ORDER — ONDANSETRON HCL 4 MG/2ML IJ SOLN
INTRAMUSCULAR | Status: AC
  Filled 2022-01-09: qty 2

## 2022-01-09 MED ORDER — PHENYLEPHRINE 80 MCG/ML (10ML) SYRINGE FOR IV PUSH (FOR BLOOD PRESSURE SUPPORT)
PREFILLED_SYRINGE | INTRAVENOUS | Status: AC
  Filled 2022-01-09: qty 10

## 2022-01-09 MED ORDER — MIDAZOLAM HCL 2 MG/2ML IJ SOLN
INTRAMUSCULAR | Status: AC
  Filled 2022-01-09: qty 2

## 2022-01-09 MED ORDER — ACETAMINOPHEN 10 MG/ML IV SOLN
INTRAVENOUS | Status: AC
  Filled 2022-01-09: qty 100

## 2022-01-09 MED ORDER — DEXAMETHASONE SODIUM PHOSPHATE 4 MG/ML IJ SOLN
INTRAMUSCULAR | Status: DC | PRN
  Administered 2022-01-09: 5 mg via INTRAVENOUS

## 2022-01-09 MED ORDER — CIPROFLOXACIN IN D5W 400 MG/200ML IV SOLN
400.0000 mg | INTRAVENOUS | Status: AC
  Administered 2022-01-09: 400 mg via INTRAVENOUS

## 2022-01-09 MED ORDER — TRAMADOL HCL 50 MG PO TABS
50.0000 mg | ORAL_TABLET | Freq: Four times a day (QID) | ORAL | 0 refills | Status: DC | PRN
  Filled 2022-01-09: qty 15, 2d supply, fill #0

## 2022-01-09 MED ORDER — MIDAZOLAM HCL 5 MG/5ML IJ SOLN
INTRAMUSCULAR | Status: DC | PRN
  Administered 2022-01-09 (×2): 1 mg via INTRAVENOUS

## 2022-01-09 MED ORDER — DEXAMETHASONE SODIUM PHOSPHATE 10 MG/ML IJ SOLN
INTRAMUSCULAR | Status: AC
  Filled 2022-01-09: qty 1

## 2022-01-09 MED ORDER — TRAMADOL HCL 50 MG PO TABS
100.0000 mg | ORAL_TABLET | Freq: Once | ORAL | Status: AC
  Administered 2022-01-09: 100 mg via ORAL

## 2022-01-09 MED ORDER — AMISULPRIDE (ANTIEMETIC) 5 MG/2ML IV SOLN
INTRAVENOUS | Status: AC
  Filled 2022-01-09: qty 4

## 2022-01-09 MED ORDER — KETOROLAC TROMETHAMINE 60 MG/2ML IM SOLN
60.0000 mg | Freq: Once | INTRAMUSCULAR | Status: AC
  Administered 2022-01-09: 60 mg via INTRAMUSCULAR
  Filled 2022-01-09: qty 2

## 2022-01-09 MED ORDER — SODIUM CHLORIDE 0.9 % IR SOLN
Status: DC | PRN
  Administered 2022-01-09 (×3): 3000 mL

## 2022-01-09 MED ORDER — FENTANYL CITRATE (PF) 100 MCG/2ML IJ SOLN
INTRAMUSCULAR | Status: AC
  Filled 2022-01-09: qty 2

## 2022-01-09 MED ORDER — PHENYLEPHRINE HCL (PRESSORS) 10 MG/ML IV SOLN
INTRAVENOUS | Status: DC | PRN
  Administered 2022-01-09: 80 ug via INTRAVENOUS
  Administered 2022-01-09: 160 ug via INTRAVENOUS
  Administered 2022-01-09 (×4): 80 ug via INTRAVENOUS

## 2022-01-09 MED ORDER — PHENAZOPYRIDINE HCL 100 MG PO TABS
ORAL_TABLET | ORAL | Status: AC
  Filled 2022-01-09: qty 2

## 2022-01-09 MED ORDER — HYDROMORPHONE HCL 1 MG/ML IJ SOLN
INTRAMUSCULAR | Status: AC
  Filled 2022-01-09: qty 1

## 2022-01-09 MED ORDER — LIDOCAINE HCL (CARDIAC) PF 100 MG/5ML IV SOSY
PREFILLED_SYRINGE | INTRAVENOUS | Status: DC | PRN
  Administered 2022-01-09: 80 mg via INTRAVENOUS

## 2022-01-09 MED ORDER — LACTATED RINGERS IV SOLN: INTRAVENOUS | Status: DC

## 2022-01-09 MED ORDER — AMISULPRIDE (ANTIEMETIC) 5 MG/2ML IV SOLN
10.0000 mg | Freq: Once | INTRAVENOUS | Status: AC
  Administered 2022-01-09: 10 mg via INTRAVENOUS

## 2022-01-09 MED ORDER — HYDROMORPHONE HCL 1 MG/ML IJ SOLN
0.2500 mg | INTRAMUSCULAR | Status: DC | PRN
  Administered 2022-01-09 (×2): 0.5 mg via INTRAVENOUS

## 2022-01-09 MED ORDER — PHENAZOPYRIDINE HCL 100 MG PO TABS
200.0000 mg | ORAL_TABLET | Freq: Three times a day (TID) | ORAL | 0 refills | Status: DC | PRN
  Filled 2022-01-09: qty 20, 4d supply, fill #0

## 2022-01-09 MED ORDER — PHENAZOPYRIDINE HCL 100 MG PO TABS
200.0000 mg | ORAL_TABLET | Freq: Once | ORAL | Status: AC
  Administered 2022-01-09: 200 mg via ORAL

## 2022-01-09 MED ORDER — KETOROLAC TROMETHAMINE 30 MG/ML IJ SOLN
INTRAMUSCULAR | Status: AC
  Filled 2022-01-09: qty 2

## 2022-01-09 MED ORDER — LIDOCAINE HCL (PF) 2 % IJ SOLN
INTRAMUSCULAR | Status: AC
  Filled 2022-01-09: qty 5

## 2022-01-09 MED ORDER — ACETAMINOPHEN 10 MG/ML IV SOLN
INTRAVENOUS | Status: DC | PRN
  Administered 2022-01-09: 1000 mg via INTRAVENOUS

## 2022-01-09 MED ORDER — TRAMADOL HCL 50 MG PO TABS
ORAL_TABLET | ORAL | Status: AC
  Filled 2022-01-09: qty 2

## 2022-01-09 MED ORDER — PROPOFOL 10 MG/ML IV BOLUS
INTRAVENOUS | Status: DC | PRN
  Administered 2022-01-09: 30 mg via INTRAVENOUS
  Administered 2022-01-09: 40 mg via INTRAVENOUS
  Administered 2022-01-09: 170 mg via INTRAVENOUS
  Administered 2022-01-09: 40 mg via INTRAVENOUS

## 2022-01-09 MED ORDER — CIPROFLOXACIN IN D5W 400 MG/200ML IV SOLN
INTRAVENOUS | Status: AC
  Filled 2022-01-09: qty 200

## 2022-01-09 MED ORDER — IOHEXOL 300 MG/ML  SOLN
INTRAMUSCULAR | Status: DC | PRN
  Administered 2022-01-09: 28 mL via URETHRAL

## 2022-01-09 MED ORDER — PROPOFOL 10 MG/ML IV BOLUS
INTRAVENOUS | Status: AC
  Filled 2022-01-09: qty 20

## 2022-01-09 MED ORDER — FENTANYL CITRATE (PF) 100 MCG/2ML IJ SOLN
INTRAMUSCULAR | Status: DC | PRN
  Administered 2022-01-09: 25 ug via INTRAVENOUS
  Administered 2022-01-09: 50 ug via INTRAVENOUS
  Administered 2022-01-09: 25 ug via INTRAVENOUS
  Administered 2022-01-09: 50 ug via INTRAVENOUS

## 2022-01-09 MED ORDER — TAMSULOSIN HCL 0.4 MG PO CAPS
0.4000 mg | ORAL_CAPSULE | Freq: Every day | ORAL | 1 refills | Status: DC
  Filled 2022-01-09: qty 30, 30d supply, fill #0

## 2022-01-09 MED ORDER — ONDANSETRON HCL 4 MG/2ML IJ SOLN
INTRAMUSCULAR | Status: DC | PRN
  Administered 2022-01-09: 4 mg via INTRAVENOUS

## 2022-01-09 MED ORDER — STERILE WATER FOR IRRIGATION IR SOLN
Status: DC | PRN
  Administered 2022-01-09: 500 mL

## 2022-01-09 SURGICAL SUPPLY — 24 items
BAG DRAIN URO-CYSTO SKYTR STRL (DRAIN) ×2 IMPLANT
BAG DRN UROCATH (DRAIN) ×2
BASKET ZERO TIP NITINOL 2.4FR (BASKET) IMPLANT
BSKT STON RTRVL ZERO TP 2.4FR (BASKET) ×2
CATH URET 5FR 28IN OPEN ENDED (CATHETERS) IMPLANT
CATH URETL OPEN 5X70 (CATHETERS) ×2 IMPLANT
CLOTH BEACON ORANGE TIMEOUT ST (SAFETY) ×2 IMPLANT
COVER DOME SNAP 22 D (MISCELLANEOUS) IMPLANT
GLOVE BIO SURGEON STRL SZ7.5 (GLOVE) ×2 IMPLANT
GLOVE BIOGEL PI IND STRL 6 (GLOVE) IMPLANT
GLOVE SURG SS PI 7.5 STRL IVOR (GLOVE) IMPLANT
GOWN STRL REUS W/TWL LRG LVL3 (GOWN DISPOSABLE) IMPLANT
GOWN STRL REUS W/TWL XL LVL3 (GOWN DISPOSABLE) ×2 IMPLANT
GUIDEWIRE STR DUAL SENSOR (WIRE) ×2 IMPLANT
IV NS IRRIG 3000ML ARTHROMATIC (IV SOLUTION) ×4 IMPLANT
KIT TURNOVER CYSTO (KITS) ×2 IMPLANT
LASER FIB FLEXIVA PULSE ID 365 (Laser) IMPLANT
MANIFOLD NEPTUNE II (INSTRUMENTS) ×2 IMPLANT
PACK CYSTO (CUSTOM PROCEDURE TRAY) ×2 IMPLANT
SHEATH URETERAL 12FRX28CM (UROLOGICAL SUPPLIES) IMPLANT
STENT URET 6FRX26 CONTOUR (STENTS) IMPLANT
TUBE CONNECTING 12X1/4 (SUCTIONS) IMPLANT
TUBING UROLOGY SET (TUBING) ×2 IMPLANT
WATER STERILE IRR 500ML POUR (IV SOLUTION) IMPLANT

## 2022-01-09 NOTE — Anesthesia Preprocedure Evaluation (Signed)
Anesthesia Evaluation  Patient identified by MRN, date of birth, ID band Patient awake    Reviewed: Allergy & Precautions, NPO status , Patient's Chart, lab work & pertinent test results  Airway Mallampati: II       Dental   Pulmonary Current Smoker and Patient abstained from smoking.   breath sounds clear to auscultation       Cardiovascular negative cardio ROS  Rhythm:Regular Rate:Normal     Neuro/Psych  Neuromuscular disease    GI/Hepatic negative GI ROS, Neg liver ROS,,,  Endo/Other    Renal/GU Renal disease     Musculoskeletal   Abdominal   Peds  Hematology   Anesthesia Other Findings   Reproductive/Obstetrics                             Anesthesia Physical Anesthesia Plan  ASA: 2  Anesthesia Plan: General   Post-op Pain Management: Tylenol PO (pre-op)*   Induction:   PONV Risk Score and Plan: 2 and Ondansetron, Dexamethasone and Midazolam  Airway Management Planned: LMA  Additional Equipment:   Intra-op Plan:   Post-operative Plan: Extubation in OR  Informed Consent: I have reviewed the patients History and Physical, chart, labs and discussed the procedure including the risks, benefits and alternatives for the proposed anesthesia with the patient or authorized representative who has indicated his/her understanding and acceptance.     Dental advisory given  Plan Discussed with: CRNA and Anesthesiologist  Anesthesia Plan Comments:        Anesthesia Quick Evaluation

## 2022-01-09 NOTE — Op Note (Signed)
Preoperative diagnosis:  History of left ureteral stones Right ureteral mass, low-grade urothelial carcinoma  Postoperative diagnosis:  Same  Procedure: Cystoscopy, bilateral retrograde pyelogram with interpretation Left ureteral stent removal Right ureteroscopy, laser resection of ureteral mass, right ureteral stent exchange  Surgeon: Ardis Hughs, MD  Anesthesia: General  Complications: None  Intraoperative findings:  #1: [name] left retrograde pyelogram demonstrated no filling defects or other abnormalities.  There were no residual stone fragments appreciated on the retrograde pyelogram.  Was otherwise normal. 2.:  [name] right retrograde pyelogram demonstrated a filling defect in the mid ureter with no appreciable contrast getting beyond that area initially.  Once I was able to get the contrast beyond this area there was no hydronephrosis. #3: The patient tumor measured approximately 1-1/2 cm x 1-1/2 cm.  There appeared to be several tumors along the course of the ureter in that space.  The tumors were lasered off of the urothelium and then lassoed with a 0 tip basket.  It was difficult to the tell if all the tumor had been the completely excised or resected at the end of the case, will require follow-up ureteroscopy in 6 weeks.  EBL: Minimal  Specimens: None  Indication: Vena Austria Sr. is a 56 y.o. patient with history of left ureteral stone who under went ureteroscopy and laser lithotripsy 3 weeks prior.  At that time he was noted to have hydronephrosis of the right kidney as well, and further investigation demonstrated a large ureteral mass.  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  After reviewing the management options for treatment, he elected to proceed with the above surgical procedure(s). We have discussed the potential benefits and risks of the procedure, side effects of the proposed treatment, the  likelihood of the patient achieving the goals of the procedure, and any potential problems that might occur during the procedure or recuperation. Informed consent has been obtained.  Description of procedure:  The patient was taken to the operating room and general anesthesia was induced.  The patient was placed in the dorsal lithotomy position, prepped and draped in the usual sterile fashion, and preoperative antibiotics were administered. A preoperative time-out was performed.   21 French 30 degree cystoscope was gently passed to the patient's urethra and into the bladder under visual guidance.  The stent emanating from the patient's left ureteral orifice was grasped and brought to the urethral meatus with a stent grasper.  A wire was then advanced up through the stent and the stent was removed.  I then exchanged the stent for an open-ended ureteral catheter and performed retrograde pyelogram using 10 cc of Omnipaque contrast with the above findings.  Subsequently remove the open-ended catheter and allow the left kidney to drain.  I then repassed the cystoscope and grasped the stent emanating from the patient's right ureteral orifice and pulled it down to the urethral meatus.  I advanced the wire through the stent and remove the stent over the wire.  However, I was unable to get the wire initially beyond the area of concern.  I did pull the wire back and try to gently get it past this area unsuccessfully.  I exchanged the wire for an open-ended catheter and performed retrograde pyelogram which did demonstrate a large filling defect in the mid ureter with no contrast initially getting beyond this area.  I advanced the catheter over the wire up to this area and then was able to get  some contrast to go beyond the filling defect and there was no hydronephrosis noted.  I advanced the wire up into the kidney and then remove the open-ended catheter.  Subsequently advanced a second wire through the ureteroscope and  up into the right kidney.  I then advanced a short 12/14 French ureteral access sheath up to the mid ureter.  I remove the inner portion of the sheath and the wire.  I then advanced a dual-lumen semirigid ureteroscope up to this area and encountered the ureteral tumor.  The findings were as above.  Using a 74 m laser fiber with settings of 4 J and 40 Hz I ablated the sidewall of the ureter and attempted to resect the tumors in their entirety.  Once I was able to get these tumors mostly free I used a 0 tip basket and lasso the the tumors and then pulled them off the sidewall.  There was quite a bit of edema and char from the laser along the sidewall and it was difficult to really interpret whether all the tumor had been removed.  However, the majority was certainly removed.  Satisfied with this resection I did gently push the scope up beyond the segment to the proximal ureter and noted no tumors or other abnormalities.  I then slowly pulled out again noting no residual free-floating tumor.  I then backloaded the safety wire through the cystoscope and advanced cystoscope into the patient's bladder.  Under fluoroscopic guidance I advanced a 26 cm time 6 French double-J ureteral stent up the right ureter into the right renal pelvis.  Once it was noted to be well within the pelvis I pulled the scope back to the bladder neck and advanced the stent into the bladder neck before removing the wire.  A nice curl was noted within the renal pelvis as well as in the bladder.  The bladder was subsequently emptied and the scope removed.  The patient was subsequently extubated return the PACU in stable condition without any recognized complications.  Disposition: The patient will be scheduled for follow-up to discuss the pathology report and next steps within the coming weeks.  Ardis Hughs, M.D.

## 2022-01-09 NOTE — Anesthesia Procedure Notes (Signed)
Procedure Name: LMA Insertion Date/Time: 01/09/2022 9:57 AM  Performed by: Bufford Spikes, CRNAPre-anesthesia Checklist: Patient identified, Emergency Drugs available, Suction available and Patient being monitored Patient Re-evaluated:Patient Re-evaluated prior to induction Oxygen Delivery Method: Circle system utilized Preoxygenation: Pre-oxygenation with 100% oxygen Induction Type: IV induction Ventilation: Mask ventilation without difficulty LMA: LMA inserted LMA Size: 4.0 Number of attempts: 1 Airway Equipment and Method: Bite block Placement Confirmation: positive ETCO2 Tube secured with: Tape Dental Injury: Teeth and Oropharynx as per pre-operative assessment

## 2022-01-09 NOTE — Progress Notes (Signed)
Patient very nauseous. Patient vomited 150cc. Dr. Nyoka Cowden notified and new order was given for a one time dose of Barhemsys '10mg'$  IV

## 2022-01-09 NOTE — Transfer of Care (Signed)
Immediate Anesthesia Transfer of Care Note  Patient: Glen Austria Sr.  Procedure(s) Performed: RIGHT URETERAL MASS EXCISION (Right: Ureter) LEFT STENT REMOVAL, RIGHT STENT EXCHANGE (Bilateral: Ureter)  Patient Location: PACU  Anesthesia Type:General  Level of Consciousness: awake, alert , and oriented  Airway & Oxygen Therapy: Patient Spontanous Breathing and Patient connected to nasal cannula oxygen  Post-op Assessment: Report given to RN and Post -op Vital signs reviewed and stable  Post vital signs: Reviewed and stable  Last Vitals:  Vitals Value Taken Time  BP    Temp    Pulse    Resp    SpO2      Last Pain:  Vitals:   01/09/22 0852  TempSrc: Oral         Complications: No notable events documented.

## 2022-01-09 NOTE — Interval H&P Note (Signed)
History and Physical Interval Note: During the patient's initial ureteroscopic evaluation he was found to have a large tumor in the right ureter.  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  However, it is obstructing.  We discussed management strategies and I recommended that we attempt to manage this with ureteroscopic resection.  I went through the associated risk and benefits.  At the same time we should be able to also remove his left ureteral stent.  Having gone through all this the patient opted to proceed.  01/09/2022 9:38 AM  Vena Austria Sr.  has presented today for surgery, with the diagnosis of RIGHT URETERAL CANCER.  The various methods of treatment have been discussed with the patient and family. After consideration of risks, benefits and other options for treatment, the patient has consented to  Procedure(s) with comments: RIGHT URETERAL MASS EXCISION (Right) - 75 MINUTES NEEDED FOR CASE LEFT STENT REMOVAL, RIGHT STENT EXCHANGE (Bilateral) as a surgical intervention.  [name] history has been reviewed, patient examined, no change in status, stable for surgery.  I have reviewed the patient's chart and labs.  Questions were answered to the patient's satisfaction.     Ardis Hughs

## 2022-01-09 NOTE — Anesthesia Postprocedure Evaluation (Signed)
Anesthesia Post Note  Patient: Vena Austria Sr.  Procedure(s) Performed: RIGHT URETERAL MASS EXCISION (Right: Ureter) LEFT STENT REMOVAL, RIGHT STENT EXCHANGE (Bilateral: Ureter)     Patient location during evaluation: PACU Anesthesia Type: General Level of consciousness: awake Pain management: pain level controlled Vital Signs Assessment: post-procedure vital signs reviewed and stable Respiratory status: spontaneous breathing Cardiovascular status: stable Postop Assessment: no apparent nausea or vomiting Anesthetic complications: no   No notable events documented.  Last Vitals:  Vitals:   01/09/22 1230 01/09/22 1235  BP:    Pulse: 71 74  Resp: 16 13  Temp:  36.6 C  SpO2: 100% 99%    Last Pain:  Vitals:   01/09/22 1230  TempSrc:   PainSc: 0-No pain                 Kaeley Vinje

## 2022-01-09 NOTE — Progress Notes (Signed)
Patient still shaking in pain and nauseous. Dr. Louis Meckel notified and he stated the patient could take the zofran he had at home. Also he gave a one time order for IM toradol '60mg'$ .

## 2022-01-09 NOTE — Discharge Instructions (Signed)
DISCHARGE INSTRUCTIONS FOR  URETERAL STENT   MEDICATIONS:  1.  Resume all your other meds from home.  2. Pyridium is to help with the burning/stinging when you urinate. 3. Tramadol is for moderate/severe pain, otherwise taking upto 1000 mg every 6 hours of plainTylenol will help treat your pain.    ACTIVITY:  1. No strenuous activity x 1week  2. No driving while on narcotic pain medications  3. Drink plenty of water  4. Continue to walk at home - you can still get blood clots when you are at home, so keep active, but don't over do it.  5. May return to work/school tomorrow or when you feel ready   BATHING:  1. You can shower and we recommend daily showers    SIGNS/SYMPTOMS TO CALL:  Please call us if you have a fever greater than 101.5, uncontrolled nausea/vomiting, uncontrolled pain, dizziness, unable to urinate, bloody urine, chest pain, shortness of breath, leg swelling, leg pain, redness around wound, drainage from wound, or any other concerns or questions.   You can reach [place] at [phone].   FOLLOW-UP:  1. You have an appointment in 2 weeks to have your stent removed and discuss any additional management options.

## 2022-01-10 ENCOUNTER — Encounter (HOSPITAL_BASED_OUTPATIENT_CLINIC_OR_DEPARTMENT_OTHER): Payer: Self-pay | Admitting: Urology

## 2022-01-10 LAB — SURGICAL PATHOLOGY

## 2022-01-10 LAB — URINE CULTURE: Culture: NO GROWTH

## 2022-01-12 ENCOUNTER — Emergency Department (HOSPITAL_COMMUNITY)
Admission: EM | Admit: 2022-01-12 | Discharge: 2022-01-12 | Discharge status: Against Medical Advice | Payer: Self-pay | Source: Home / Self Care

## 2022-04-26 ENCOUNTER — Encounter (HOSPITAL_COMMUNITY): Payer: Self-pay

## 2022-04-26 ENCOUNTER — Emergency Department (HOSPITAL_COMMUNITY)
Admission: EM | Admit: 2022-04-26 | Discharge: 2022-04-27 | Attending: Emergency Medicine | Admitting: Emergency Medicine

## 2022-04-26 ENCOUNTER — Emergency Department (HOSPITAL_COMMUNITY)

## 2022-04-26 ENCOUNTER — Other Ambulatory Visit: Payer: Self-pay

## 2022-04-26 DIAGNOSIS — R300 Dysuria: Secondary | ICD-10-CM | POA: Insufficient documentation

## 2022-04-26 DIAGNOSIS — Z8554 Personal history of malignant neoplasm of ureter: Secondary | ICD-10-CM | POA: Insufficient documentation

## 2022-04-26 DIAGNOSIS — R079 Chest pain, unspecified: Secondary | ICD-10-CM | POA: Diagnosis present

## 2022-04-26 LAB — URINALYSIS, ROUTINE W REFLEX MICROSCOPIC
Bacteria, UA: NONE SEEN
Bilirubin Urine: NEGATIVE
Glucose, UA: NEGATIVE mg/dL
Ketones, ur: NEGATIVE mg/dL
Nitrite: NEGATIVE
Protein, ur: 100 mg/dL — AB
RBC / HPF: >50 RBC/hpf (ref 0–5)
Specific Gravity, Urine: 1.023 (ref 1.005–1.030)
pH: 5 (ref 5.0–8.0)

## 2022-04-26 LAB — BASIC METABOLIC PANEL
Anion gap: 10 (ref 5–15)
BUN: 13 mg/dL (ref 6–20)
CO2: 26 mmol/L (ref 22–32)
Calcium: 9 mg/dL (ref 8.9–10.3)
Chloride: 102 mmol/L (ref 98–111)
Creatinine, Ser: 0.82 mg/dL (ref 0.61–1.24)
GFR, Estimated: >60 mL/min (ref >60)
Glucose, Bld: 178 mg/dL — ABNORMAL HIGH (ref 70–99)
Potassium: 3.9 mmol/L (ref 3.5–5.1)
Sodium: 138 mmol/L (ref 135–145)

## 2022-04-26 LAB — TROPONIN I (HIGH SENSITIVITY)
Troponin I (High Sensitivity): 3 ng/L (ref <18)
Troponin I (High Sensitivity): 4 ng/L (ref <18)

## 2022-04-26 LAB — CBC
HCT: 41.6 % (ref 39.0–52.0)
Hemoglobin: 13.8 g/dL (ref 13.0–17.0)
MCH: 30 pg (ref 26.0–34.0)
MCHC: 33.2 g/dL (ref 30.0–36.0)
MCV: 90.4 fL (ref 80.0–100.0)
Platelets: 209 10*3/uL (ref 150–400)
RBC: 4.6 MIL/uL (ref 4.22–5.81)
RDW: 13.3 % (ref 11.5–15.5)
WBC: 6.9 10*3/uL (ref 4.0–10.5)
nRBC: 0 % (ref 0.0–0.2)

## 2022-04-26 LAB — D-DIMER, QUANTITATIVE: D-Dimer, Quant: <0.27 ug/mL-FEU (ref 0.00–0.50)

## 2022-04-26 MED ORDER — NAPROXEN 375 MG PO TABS: 375.0000 mg | ORAL_TABLET | Freq: Two times a day (BID) | ORAL | 0 refills | Status: DC

## 2022-04-26 MED ORDER — KETOROLAC TROMETHAMINE 15 MG/ML IJ SOLN
15.0000 mg | Freq: Once | INTRAMUSCULAR | Status: AC
  Administered 2022-04-26: 15 mg via INTRAVENOUS
  Filled 2022-04-26: qty 1

## 2022-04-26 NOTE — ED Provider Notes (Signed)
[facility] Provider Note   CSN: [id number] Arrival date & time: 04/26/22  1903     History  Chief Complaint  Patient presents with   Chest Pain    Glen Austria Sr. is a 57 y.o. male.  Patient is a 57 year old male with a history of ureter cancer on the right status post removal and stent currently in place, presenting today with complaint of sudden onset of chest pain that occurred around 4:00 this afternoon.  Patient reports that he was sitting doing nothing when he developed a sharp stabbing and weight type of pain in his chest that went into his left arm.  He has felt short of breath with this pain and it is worse when he takes a deep breath.  He has not had significant URI symptoms like cough, fever or congestion.  Denies prior history of similar symptoms.  Pt reported no nausea or vomiting but told triage he had N/V.  Patient has no prior chest history denies tobacco use or lung disease.  Patient does report he is currently in jail because of some outstanding warrants but they were actually for his son and he will be released on Tuesday.  The history is provided by the patient and medical records.  Chest Pain      Home Medications Prior to Admission medications   Medication Sig Start Date End Date Taking? Authorizing Provider  naproxen (NAPROSYN) 375 MG tablet Take 1 tablet (375 mg total) by mouth 2 (two) times daily. 04/26/22  Yes Blanchie Dessert, MD  hydrOXYzine (VISTARIL) 25 MG capsule Take 1 capsule (25 mg total) by mouth 3 (three) times daily as needed for anxiety. 12/12/21   Kathie Dike, MD  latanoprost (XALATAN) 0.005 % ophthalmic solution 1 drop at bedtime.    [provider]  phenazopyridine (PYRIDIUM) 100 MG tablet Take 2 tablets (200 mg total) by mouth 3 (three) times daily as needed for pain. 01/09/22   Ardis Hughs, MD  Polyvinyl Alcohol (LIQUID TEARS OP) Place 3 drops into the right eye daily as  needed (dryness).    [provider]  tamsulosin (FLOMAX) 0.4 MG CAPS capsule Take 1 capsule (0.4 mg total) by mouth daily. 01/09/22   Ardis Hughs, MD  timolol (TIMOPTIC) 0.25 % ophthalmic solution Place 1 drop into the left eye at bedtime.    [provider]  traMADol (ULTRAM) 50 MG tablet Take 1-2 tablets (50-100 mg total) by mouth every 6 (six) hours as needed for moderate pain. 01/09/22   Ardis Hughs, MD      Allergies    Other and Sulfa antibiotics    Review of Systems   Review of Systems  Cardiovascular:  Positive for chest pain.  Genitourinary:        Patient does report he still has a stent in his right ureter because he has not followed up because he has been in jail but the plan was to take it out.  He does have dysuria and blood in his urine has been going on for a while.    Physical Exam Updated Vital Signs BP 112/84   Pulse 77   Temp 98.2 F (36.8 C) (Oral)   Resp 15   Ht '5\' 9"'$  (1.753 m)   Wt 83.9 kg   SpO2 98%   BMI 27.32 kg/m  Physical Exam Vitals and nursing note reviewed.  Constitutional:      General: He is not in  acute distress.    Appearance: He is well-developed.  HENT:     Head: Normocephalic and atraumatic.  Eyes:     Conjunctiva/sclera: Conjunctivae normal.     Comments: Left pupil is reactive.  Right eye is prosthetic  Cardiovascular:     Rate and Rhythm: Normal rate and regular rhythm.     Heart sounds: No murmur heard. Pulmonary:     Effort: Pulmonary effort is normal. No respiratory distress.     Breath sounds: Normal breath sounds. No wheezing or rales.  Abdominal:     General: There is no distension.     Palpations: Abdomen is soft.     Tenderness: There is no abdominal tenderness. There is no guarding or rebound.  Musculoskeletal:        General: No tenderness. Normal range of motion.     Cervical back: Normal range of motion and neck supple.     Right lower leg: No edema.     Left lower leg: No edema.   Skin:    General: Skin is warm and dry.     Findings: No erythema or rash.  Neurological:     Mental Status: He is alert and oriented to person, place, and time. Mental status is at baseline.  Psychiatric:        Behavior: Behavior normal.     ED Results / Procedures / Treatments   Labs (all labs ordered are listed, but only abnormal results are displayed) Labs Reviewed  BASIC METABOLIC PANEL - Abnormal; Notable for the following components:      Result Value   Glucose, Bld 178 (*)    All other components within normal limits  URINALYSIS, ROUTINE W REFLEX MICROSCOPIC - Abnormal; Notable for the following components:   Color, Urine AMBER (*)    APPearance CLOUDY (*)    Hgb urine dipstick LARGE (*)    Protein, ur 100 (*)    Leukocytes,Ua TRACE (*)    All other components within normal limits  CBC  D-DIMER, QUANTITATIVE  TROPONIN I (HIGH SENSITIVITY)  TROPONIN I (HIGH SENSITIVITY)    EKG EKG Interpretation  Date/Time:  Friday April 26 2022 19:16:25 EST Ventricular Rate:  96 PR Interval:  158 QRS Duration: 88 QT Interval:  350 QTC Calculation: 442 R Axis:   -17 Text Interpretation: Normal sinus rhythm new Minimal voltage criteria for LVH, may be normal variant ( R in aVL ) Cannot rule out Anterior infarct , age undetermined When compared with ECG of 08-Dec-2021 12:39, PREVIOUS ECG IS PRESENT Confirmed by Blanchie Dessert [id number]) on 04/26/2022 8:55:59 PM  Radiology DG Chest 2 View  Result Date: 04/26/2022 CLINICAL DATA:  Chest pain EXAM: CHEST - 2 VIEW COMPARISON:  12/08/2021 FINDINGS: Shallow inspiration. Heart size and pulmonary vascularity are normal. Lungs are clear. No pleural effusions. No pneumothorax. Mediastinal contours appear intact. Metallic pellet demonstrated in the soft tissues of the lateral left chest, unchanged. Old left rib fractures. IMPRESSION: No active cardiopulmonary disease. Electronically Signed   By: Lucienne Capers M.D.   On: 04/26/2022  20:41    Procedures Procedures    Medications Ordered in ED Medications  ketorolac (TORADOL) 15 MG/ML injection 15 mg (15 mg Intravenous Given 04/26/22 2337)    ED Course/ Medical Decision Making/ A&P                             Medical Decision Making Amount and/or Complexity of Data Reviewed Labs:  ordered. Decision-making details documented in ED Course. Radiology: ordered.  Risk Prescription drug management.   Pt with multiple medical problems and comorbidities and presenting today with a complaint that caries a high risk for morbidity and mortality.  Here today with concern concern for abrupt onset of chest pain.  Patient is still currently have pain and it is pleuritic in nature.  Initially radiated down his right arm but is no longer radiating.  Patient symptoms have been going on for the last 5 hours.  Concern for ACS versus PE versus spontaneous pneumothorax.  Low risk heart score of 2.Patient denies any history suggestive of infectious etiology.  He has ongoing issues with ureteral cancer and stent placement with ongoing hematuria and pain and is in need of following up with Dr. Louis Meckel for stent removal but went to jail when he was supposed to follow-up.  Do not feel that the stent and issues with his urine have anything to do with the pain in his chest today.  I independently interpreted patient's EKG that shows no evidence of ST elevation today.  He does have some mild voltage changes that are new.  I have independently visualized and interpreted pt's images today.  Chest x-ray within normal limits.  11:43 PM I independently interpreted patient's labs today and troponin x 2, D-dimer are within normal limits, UA with large hemoglobin but no evidence of an infection which is typical because he has a known stent in place, BMP without acute findings.  Low suspicion for dissection at this time as patient has normal pulses bilaterally, normal blood pressure, no significant risk  factors.  No abd pain on recurrent exam and pt is asking to eat.  Discussed findings with pt.  At this time feel he is stable for d/c home and discussed f/u with him.          Final Clinical Impression(s) / ED Diagnoses Final diagnoses:  Nonspecific chest pain    Rx / DC Orders ED Discharge Orders          Ordered    naproxen (NAPROSYN) 375 MG tablet  2 times daily        04/26/22 2343              Blanchie Dessert, MD 04/26/22 2345

## 2022-04-26 NOTE — ED Triage Notes (Addendum)
PER EMS: pt is an inmate from jail with c/o sudden onset of "electric" left sided chest pain/tightness that radiated down to his right arm associated with nausea and vomiting.  Ems adm 1 SL nitro tab without improvement of pain. He also received 324 of aspirin.   Pt arrives in shackles and handcuffs accompanied by detention officer. Pt is also blind.  He also reports he has a hx of bladder cancer and recently had surgery 3 months ago. He reports current hematuria.  Marland Kitchen BP- 118/82, HR-100, O2-100% RA, CBG-202 18g LAC

## 2022-04-26 NOTE — Discharge Instructions (Addendum)
All the blood test today were normal without any signs that you have a blood clot in your lung or heart attack.  No signs of pneumonia or masses in your lungs or collapsed lung.  May be related to inflammation in your muscle tissue or lung lining.  We have given you an anti-inflammatory for the next few days to help with the pain.  If you start having passing out, palpitations, high fever you should return to the emergency room.

## 2022-04-26 NOTE — ED Notes (Signed)
Pt stating that chest is hurting and right arm is tingling and numb

## 2022-04-30 ENCOUNTER — Other Ambulatory Visit: Payer: Self-pay | Admitting: Urology

## 2022-05-23 NOTE — Patient Instructions (Addendum)
Preop instructions for:    Glen Mcmillan. Cieslik Sr.  Date of Birth:   05-23-1965                    Date of Procedure: Friday, June 07, 2022  Procedure:  RIGHT URETEROSCOPY/LASER TUMOR ABLATION/RIGHT URETERAL STENT EXCHANGE     Surgeon:  Dr. Louis Meckel Facility contact: [facility]    Phone:   [phone]              Health Care POA: RN contact name/phone#: Kristin Bruins                    and Fax #: [phone]   Transportation contact phone#:   Please send day of procedure:  Current med list  Medications taken the day of procedure (return attached form to hospital) confirm time of nothing by mouth status (return attached form to hospital) Patient Demographic info( to include DNR status, problem list, allergies) Bring Insurance card and picture ID    Time to arrive at [facility]: 0945 AM   Report to: Admitting (On your left hand side)    Do not eat solid food or drink past midnight the night before your procedure.(To include any tube feedings-must be discontinued)  Take these morning medications only with sips of water.(or give through gastrostomy or feeding tube). Tamsulosin   The Day Before Surgery:    Note: No Insulin or Diabetic meds should be given or taken the morning of the procedure!  Oral Hygiene is also important to reduce your risk of infection.                                    Remember - BRUSH YOUR TEETH THE MORNING OF SURGERY WITH YOUR REGULAR TOOTHPASTE   DENTURES WILL BE REMOVED PRIOR TO SURGERY PLEASE DO NOT APPLY "Poly grip" OR ADHESIVES!!!  Leave all jewelry and other valuables at place where living( no metal or rings to be worn) No contact lens Women-no make-up, no lotions,perfumes,powders Men-no colognes,lotions   Any questions day of procedure,call  [phone]     Sent from :[facility]                   Phone:[phone]                   Fax:[phone]   Sent by :               RN

## 2022-05-23 NOTE — Progress Notes (Addendum)
Unable to complete was not able to speak with the patient  For Anesthesia: PCP -  Cardiologist -   Chest x-ray - 04/26/22 in [facility] EKG - 04/26/22 in [facility] Stress Test - 09/08/20 [facility] ECHO - 09/08/20 [facility] Cardiac Cath - 02/15/21 [facility] Pacemaker/ICD device last checked: Pacemaker orders received: Device Rep notified:  Spinal Cord Stimulator:  Sleep Study -  CPAP -   Fasting Blood Sugar -  Checks Blood Sugar _____ times a day Date and result of last Hgb A1c-  Last dose of GLP1 agonist-  GLP1 instructions:   Last dose of SGLT-2 inhibitors-  SGLT-2 instructions:  Blood Thinner Instructions: Aspirin Instructions: Last Dose:  Activity level: Can go up a flight of stairs and activities of daily living without stopping and without chest pain and/or shortness of breath   Able to exercise without chest pain and/or shortness of breath   Unable to go up a flight of stairs without chest pain and/or shortness of breath     Anesthesia review:   Patient denies shortness of breath, fever, cough and chest pain at PAT appointment   Patient verbalized understanding of instructions reviewed via telephone.

## 2022-06-07 ENCOUNTER — Encounter (HOSPITAL_COMMUNITY): Admission: RE | Payer: Self-pay | Source: Home / Self Care

## 2022-06-07 ENCOUNTER — Encounter (HOSPITAL_COMMUNITY): Payer: Self-pay | Admitting: Anesthesiology

## 2022-06-07 ENCOUNTER — Ambulatory Visit (HOSPITAL_COMMUNITY): Admission: RE | Admit: 2022-06-07 | Payer: Self-pay | Source: Home / Self Care | Admitting: Urology

## 2022-06-07 SURGERY — CYSTOSCOPY/URETEROSCOPY/HOLMIUM LASER/STENT PLACEMENT
Anesthesia: General | Laterality: Right

## 2022-06-07 MED ORDER — PROPOFOL 10 MG/ML IV BOLUS
INTRAVENOUS | Status: AC
  Filled 2022-06-07: qty 20

## 2022-06-07 MED ORDER — FENTANYL CITRATE (PF) 100 MCG/2ML IJ SOLN
INTRAMUSCULAR | Status: AC
  Filled 2022-06-07: qty 2

## 2022-06-07 MED ORDER — DEXAMETHASONE SODIUM PHOSPHATE 10 MG/ML IJ SOLN
INTRAMUSCULAR | Status: AC
  Filled 2022-06-07: qty 1

## 2022-06-07 MED ORDER — LIDOCAINE HCL (PF) 2 % IJ SOLN
INTRAMUSCULAR | Status: AC
  Filled 2022-06-07: qty 5

## 2022-06-07 MED ORDER — ONDANSETRON HCL 4 MG/2ML IJ SOLN
INTRAMUSCULAR | Status: AC
  Filled 2022-06-07: qty 2

## 2022-06-07 MED ORDER — MIDAZOLAM HCL 2 MG/2ML IJ SOLN
INTRAMUSCULAR | Status: AC
  Filled 2022-06-07: qty 2

## 2022-06-07 NOTE — Anesthesia Preprocedure Evaluation (Signed)
Anesthesia Evaluation    Reviewed: Allergy & Precautions, Patient's Chart, lab work & pertinent test results  Airway        Dental   Pulmonary Current Smoker          Cardiovascular negative cardio ROS      Neuro/Psych negative neurological ROS  negative psych ROS   GI/Hepatic negative GI ROS, Neg liver ROS,,,  Endo/Other  negative endocrine ROS    Renal/GU negative Renal ROS  negative genitourinary   Musculoskeletal negative musculoskeletal ROS (+)    Abdominal   Peds  Hematology negative hematology ROS (+)   Anesthesia Other Findings   Reproductive/Obstetrics negative OB ROS                             Anesthesia Physical Anesthesia Plan  ASA: 2  Anesthesia Plan: General   Post-op Pain Management: Tylenol PO (pre-op)*   Induction: Intravenous  PONV Risk Score and Plan: 1 and Ondansetron, Dexamethasone, Treatment may vary due to age or medical condition and Midazolam  Airway Management Planned: LMA  Additional Equipment: None  Intra-op Plan:   Post-operative Plan: Extubation in OR  Informed Consent:   Plan Discussed with:   Anesthesia Plan Comments:        Anesthesia Quick Evaluation

## 2022-07-17 DIAGNOSIS — J392 Other diseases of pharynx: Secondary | ICD-10-CM

## 2022-07-17 HISTORY — DX: Other diseases of pharynx: J39.2

## 2022-08-04 ENCOUNTER — Other Ambulatory Visit: Payer: Self-pay

## 2022-08-04 ENCOUNTER — Emergency Department (HOSPITAL_COMMUNITY): Payer: Medicaid Other

## 2022-08-04 ENCOUNTER — Encounter (HOSPITAL_COMMUNITY): Payer: Self-pay

## 2022-08-04 ENCOUNTER — Emergency Department (HOSPITAL_COMMUNITY)
Admission: EM | Admit: 2022-08-04 | Discharge: 2022-08-04 | Disposition: A | Discharge status: Home / Self Care | Payer: Medicaid Other | Attending: Emergency Medicine | Admitting: Emergency Medicine

## 2022-08-04 DIAGNOSIS — W010XXA Fall on same level from slipping, tripping and stumbling without subsequent striking against object, initial encounter: Secondary | ICD-10-CM | POA: Insufficient documentation

## 2022-08-04 DIAGNOSIS — S8992XA Unspecified injury of left lower leg, initial encounter: Secondary | ICD-10-CM | POA: Diagnosis present

## 2022-08-04 DIAGNOSIS — Y9301 Activity, walking, marching and hiking: Secondary | ICD-10-CM | POA: Diagnosis not present

## 2022-08-04 DIAGNOSIS — S81812A Laceration without foreign body, left lower leg, initial encounter: Secondary | ICD-10-CM | POA: Diagnosis not present

## 2022-08-04 DIAGNOSIS — Z8554 Personal history of malignant neoplasm of ureter: Secondary | ICD-10-CM | POA: Insufficient documentation

## 2022-08-04 DIAGNOSIS — W19XXXA Unspecified fall, initial encounter: Secondary | ICD-10-CM

## 2022-08-04 MED ORDER — LIDOCAINE-EPINEPHRINE (PF) 2 %-1:200000 IJ SOLN
10.0000 mL | Freq: Once | INTRAMUSCULAR | Status: AC
  Administered 2022-08-04: 10 mL
  Filled 2022-08-04: qty 20

## 2022-08-04 MED ORDER — OXYCODONE-ACETAMINOPHEN 5-325 MG PO TABS
1.0000 | ORAL_TABLET | Freq: Once | ORAL | Status: AC
  Administered 2022-08-04: 1 via ORAL
  Filled 2022-08-04: qty 1

## 2022-08-04 NOTE — ED Provider Notes (Signed)
[place] EMERGENCY DEPARTMENT AT [facility] Provider Note   CSN: [id number] Arrival date & time: 08/04/22  1216     History  Chief Complaint  Patient presents with   Leg Injury    Glen Anis Sr. is a 57 y.o. male with a history of ureter cancer and chronic low back pain, who presents the emergency department complaining of a left leg injury.  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  Reports scraping the front of his left shin. No other trauma from the fall. Not on blood thinners. Last tetanus within the past 3 years.   HPI     Home Medications Prior to Admission medications   Medication Sig Start Date End Date Taking? Authorizing Provider  hydrOXYzine (VISTARIL) 25 MG capsule Take 1 capsule (25 mg total) by mouth 3 (three) times daily as needed for anxiety. 12/12/21   Erick Blinks, MD  latanoprost (XALATAN) 0.005 % ophthalmic solution 1 drop at bedtime.    [provider]  naproxen (NAPROSYN) 375 MG tablet Take 1 tablet (375 mg total) by mouth 2 (two) times daily. 04/26/22   Gwyneth Sprout, MD  phenazopyridine (PYRIDIUM) 100 MG tablet Take 2 tablets (200 mg total) by mouth 3 (three) times daily as needed for pain. 01/09/22   [name], MD  Polyvinyl Alcohol (LIQUID TEARS OP) Place 3 drops into the right eye daily as needed (dryness).    [provider]  tamsulosin (FLOMAX) 0.4 MG CAPS capsule Take 1 capsule (0.4 mg total) by mouth daily. 01/09/22   [name], MD  timolol (TIMOPTIC) 0.25 % ophthalmic solution Place 1 drop into the left eye at bedtime.    [provider]  traMADol (ULTRAM) 50 MG tablet Take 1-2 tablets (50-100 mg total) by mouth every 6 (six) hours as needed for moderate pain. 01/09/22   [name], MD      Allergies    Other and Sulfa antibiotics    Review of Systems   Review of Systems   Skin:  Positive for wound.  All other systems reviewed and are negative.   Physical Exam Updated Vital Signs BP 98/66 (BP Location: Right Arm)   Pulse (!) 124   Temp 98.1 F (36.7 C)   Resp 19   SpO2 98%  Physical Exam Vitals and nursing note reviewed.  Constitutional:      Appearance: Normal appearance.  HENT:     Head: Normocephalic and atraumatic.  Eyes:     Conjunctiva/sclera: Conjunctivae normal.  Pulmonary:     Effort: Pulmonary effort is normal. No respiratory distress.  Musculoskeletal:     Comments: Normal ROM of the bilateral knees and ankles  Skin:    General: Skin is warm and dry.     Comments: Approximately 2 cm linear laceration to anterior left shin, mild amount of bleeding  Neurological:     Mental Status: Glen Mcmillan is alert.  Psychiatric:        Mood and Affect: Mood normal.        Behavior: Behavior normal.     ED Results / Procedures / Treatments   Labs (all labs ordered are listed, but only abnormal results are displayed) Labs Reviewed - No data to display  EKG None  Radiology DG Tibia/Fibula Left  Result Date: 08/04/2022 CLINICAL DATA:  Fall.  Left leg pain. EXAM: LEFT TIBIA  AND FIBULA - 2 VIEW COMPARISON:  None Available. FINDINGS: No fracture.  No bone lesion. Ankle and knee joints are normally spaced and aligned. Soft tissues are unremarkable. IMPRESSION: Negative. Electronically Signed   By: Amie Portland M.D.   On: 08/04/2022 13:49    Procedures Procedures    Medications Ordered in ED Medications  lidocaine-EPINEPHrine (XYLOCAINE W/EPI) 2 %-1:200000 (PF) injection 10 mL (10 mLs Infiltration Given by Other 08/04/22 1309)  oxyCODONE-acetaminophen (PERCOCET/ROXICET) 5-325 MG per tablet 1 tablet (1 tablet Oral Given 08/04/22 1310)    ED Course/ Medical Decision Making/ A&P                             Medical Decision Making Amount and/or Complexity of Data Reviewed Radiology: ordered.  Risk Prescription drug management.   Patient is a  57 y.o. male who presents to the emergency department with concern for left leg laceration after fall. Wound occurred <2 hrs prior to ER arrival.   Physical exam: 2 cm laceration to the anterior left shin  Imaging: XR of left tib/fib without fracture or foreign body  Procedure: Wound explored and base of wound visualized in a bloodless field without evidence of foreign body. Laceration cleaned with saline and bandaged. Patient declined suture repair.  Disposition: Patient has  no comorbidities to effect normal wound healing. Patient discharged  without antibiotics.  Discussed suture home care with patient and answered questions. Patient to follow-up for wound check; they are to return to the ED sooner for signs of infection. Pt is hemodynamically stable with no complaints prior to discharge.  Final Clinical Impression(s) / ED Diagnoses Final diagnoses:  Injury of left shin, initial encounter  Fall, initial encounter  Laceration of left lower extremity, initial encounter    Rx / DC Orders ED Discharge Orders     None      Portions of this report may have been transcribed using voice recognition software. Every effort was made to ensure accuracy; however, inadvertent computerized transcription errors may be present.    Jeanella Flattery 08/04/22 1405    Wynetta Fines, MD 08/04/22 1536

## 2022-08-04 NOTE — Discharge Instructions (Addendum)
You were seen in the emergency department for left leg injury. Your x-ray imaging did not show any broken bones.   Make sure to keep the area as clean and dry as possible. You can let warm soapy warm run over the area, but do NOT scrub it.   Watch out for signs of infection, like we discussed, including: increased redness, tenderness, or drainage of pus from the area. If this happens and you have not been prescribed an antibiotic, please seek medical attention for possible infection.   You can take over the counter pain medicine like ibuprofen or tylenol as needed.

## 2022-08-04 NOTE — ED Triage Notes (Signed)
Pt came in via POV d/t a small lac on the shin of his Lt leg that happened right before he came into ED. States that he is legally blind & the woman he was walking with "was not paying attention" & he stepped down into a hole & scraping up the front on the Rt shin. A/Ox4, bleeding stopped.

## 2022-08-05 ENCOUNTER — Other Ambulatory Visit: Payer: Self-pay | Admitting: Urology

## 2022-08-05 ENCOUNTER — Encounter (HOSPITAL_BASED_OUTPATIENT_CLINIC_OR_DEPARTMENT_OTHER): Payer: Self-pay | Admitting: Urology

## 2022-08-06 NOTE — Progress Notes (Signed)
Spoke with dr Madelaine Bhat hodierne mda and pt needs ENT dr clearance for 08-14-2022 surgery at [facility]. Left message with Shanda Bumps per dr a hodierne mda pt needs ent dr clearance for 06-14-2022 surgery with dr Marlou Porch, or if case is urgent, case can be done at [facility] main.

## 2022-08-08 NOTE — Progress Notes (Signed)
COVID Vaccine received:  []  No []  Yes Date of any COVID positive Test in last 90 days:  PCP -  Cardiologist -   Chest x-ray - 04/26/22 [facility] EKG -  04/29/22  [facility] Stress Test - 09/08/20 [facility] ECHO - 09/08/20 [facility] Cardiac Cath - 02/15/21 [facility]  Bowel Prep - []  No  []   Yes ______  Pacemaker / ICD device []  No []  Yes   Spinal Cord Stimulator:[]  No []  Yes       History of Sleep Apnea? []  No []  Yes   CPAP used?- []  No []  Yes    Does the patient monitor blood sugar?          []  No []  Yes  []  N/A  Patient has: []  NO Hx DM   []  Pre-DM                 []  DM1  []   DM2 Does patient have a [employer] or Dexacom? []  No []  Yes   Fasting Blood Sugar Ranges-  Checks Blood Sugar _____ times a day  GLP1 agonist / usual dose -  GLP1 instructions:  SGLT-2 inhibitors / usual dose -  SGLT-2 instructions:   Blood Thinner / Instructions: Aspirin Instructions:  Comments:   Activity level: Patient is able / unable to climb a flight of stairs without difficulty; []  No CP  []  No SOB, but would have ___   Patient can / can not perform ADLs without assistance.   Anesthesia review:   Patient denies shortness of breath, fever, cough and chest pain at PAT appointment.  Patient verbalized understanding and agreement to the Pre-Surgical Instructions that were given to them at this PAT appointment. Patient was also educated of the need to review these PAT instructions again prior to his/her surgery.I reviewed the appropriate phone numbers to call if they have any and questions or concerns.

## 2022-08-08 NOTE — Patient Instructions (Signed)
SURGICAL WAITING ROOM VISITATION  Patients having surgery or a procedure may have no more than 2 support people in the waiting area - these visitors may rotate.    Children under the age of 86 must have an adult with them who is not the patient.  Due to an increase in RSV and influenza rates and associated hospitalizations, children ages 60 and under may not visit patients in [facility] hospitals.  If the patient needs to stay at the hospital during part of their recovery, the visitor guidelines for inpatient rooms apply. Pre-op nurse will coordinate an appropriate time for 1 support person to accompany patient in pre-op.  This support person may not rotate.    Please refer to the [facility] website for the visitor guidelines for Inpatients (after your surgery is over and you are in a regular room).       Your procedure is scheduled on: 08/14/22   Report to [facility] Main Entrance    Report to admitting at 11:45 AM   Call this number if you have problems the morning of surgery [phone]   Do not eat food :After Midnight.      Oral Hygiene is also important to reduce your risk of infection.                                    Remember - BRUSH YOUR TEETH THE MORNING OF SURGERY WITH YOUR REGULAR TOOTHPASTE  DENTURES WILL BE REMOVED PRIOR TO SURGERY PLEASE DO NOT APPLY "Poly grip" OR ADHESIVES!!!   Do NOT smoke after Midnight   Take these medicines the morning of surgery with A SIP OF WATER:   DO NOT TAKE ANY ORAL DIABETIC MEDICATIONS DAY OF YOUR SURGERY  Bring CPAP mask and tubing day of surgery.                              You may not have any metal on your body including hair pins, jewelry, and body piercing             Do not wear make-up, lotions, powders, perfumes/cologne, or deodorant  Do not wear nail polish including gel and S&S, artificial/acrylic nails, or any other type of covering on natural nails including finger and toenails. If you have artificial  nails, gel coating, etc. that needs to be removed by a nail salon please have this removed prior to surgery or surgery may need to be canceled/ delayed if the surgeon/ anesthesia feels like they are unable to be safely monitored.   Do not shave  48 hours prior to surgery.               Men may shave face and neck.   Do not bring valuables to the hospital. [facility] IS NOT             RESPONSIBLE   FOR VALUABLES.   Contacts, glasses, dentures or bridgework may not be worn into surgery.   Bring small overnight bag day of surgery.   DO NOT BRING YOUR HOME MEDICATIONS TO THE HOSPITAL. PHARMACY WILL DISPENSE MEDICATIONS LISTED ON YOUR MEDICATION LIST TO YOU DURING YOUR ADMISSION IN THE HOSPITAL!    Patients discharged on the day of surgery will not be allowed to drive home.  Someone NEEDS to stay with you for the first 24 hours after anesthesia.  Special Instructions: Bring a copy of your healthcare power of attorney and living will documents the day of surgery if you haven't scanned them before.              Please read over the following fact sheets you were given: IF YOU HAVE QUESTIONS ABOUT YOUR PRE-OP INSTRUCTIONS PLEASE CALL [phone]   If you received a COVID test during your pre-op visit  it is requested that you wear a mask when out in public, stay away from anyone that may not be feeling well and notify your surgeon if you develop symptoms. If you test positive for Covid or have been in contact with anyone that has tested positive in the last 10 days please notify you surgeon.    [facility] - Preparing for Surgery Before surgery, you can play an important role.  Because skin is not sterile, your skin needs to be as free of germs as possible.  You can reduce the number of germs on your skin by washing with CHG (chlorahexidine gluconate) soap before surgery.  CHG is an antiseptic cleaner which kills germs and bonds with the skin to continue killing germs even after  washing. Please DO NOT use if you have an allergy to CHG or antibacterial soaps.  If your skin becomes reddened/irritated stop using the CHG and inform your nurse when you arrive at Short Stay. Do not shave (including legs and underarms) for at least 48 hours prior to the first CHG shower.  You may shave your face/neck.  Please follow these instructions carefully:  1.  Shower with CHG Soap the night before surgery and the  morning of surgery.  2.  If you choose to wash your hair, wash your hair first as usual with your normal  shampoo.  3.  After you shampoo, rinse your hair and body thoroughly to remove the shampoo.                             4.  Use CHG as you would any other liquid soap.  You can apply chg directly to the skin and wash.  Gently with a scrungie or clean washcloth.  5.  Apply the CHG Soap to your body ONLY FROM THE NECK DOWN.   Do   not use on face/ open                           Wound or open sores. Avoid contact with eyes, ears mouth and   genitals (private parts).                       Wash face,  Genitals (private parts) with your normal soap.             6.  Wash thoroughly, paying special attention to the area where your    surgery  will be performed.  7.  Thoroughly rinse your body with warm water from the neck down.  8.  DO NOT shower/wash with your normal soap after using and rinsing off the CHG Soap.                9.  Pat yourself dry with a clean towel.            10.  Wear clean pajamas.            11.  Place clean sheets on  your bed the night of your first shower and do not  sleep with pets. Day of Surgery : Do not apply any lotions/deodorants the morning of surgery.  Please wear clean clothes to the hospital/surgery center.  FAILURE TO FOLLOW THESE INSTRUCTIONS MAY RESULT IN THE CANCELLATION OF YOUR SURGERY  PATIENT SIGNATURE_________________________________  NURSE  SIGNATURE__________________________________  ________________________________________________________________________

## 2022-08-09 ENCOUNTER — Encounter (HOSPITAL_COMMUNITY): Payer: Self-pay | Admitting: Physician Assistant

## 2022-08-09 ENCOUNTER — Encounter (HOSPITAL_COMMUNITY): Payer: Self-pay

## 2022-08-09 ENCOUNTER — Encounter (HOSPITAL_COMMUNITY)
Admission: RE | Admit: 2022-08-09 | Discharge: 2022-08-09 | Disposition: A | Discharge status: Home / Self Care | Payer: Medicaid Other | Source: Ambulatory Visit | Attending: Urology | Admitting: Urology

## 2022-08-09 ENCOUNTER — Encounter (HOSPITAL_COMMUNITY): Payer: Self-pay | Admitting: Anesthesiology

## 2022-08-09 ENCOUNTER — Other Ambulatory Visit: Payer: Self-pay

## 2022-08-09 VITALS — BP 135/82 | HR 72 | Temp 98.0°F | Resp 18 | Ht 68.0 in | Wt 191.0 lb

## 2022-08-09 DIAGNOSIS — Z01818 Encounter for other preprocedural examination: Secondary | ICD-10-CM | POA: Insufficient documentation

## 2022-08-09 DIAGNOSIS — C661 Malignant neoplasm of right ureter: Secondary | ICD-10-CM | POA: Insufficient documentation

## 2022-08-09 DIAGNOSIS — H5461 Unqualified visual loss, right eye, normal vision left eye: Secondary | ICD-10-CM | POA: Diagnosis not present

## 2022-08-09 DIAGNOSIS — F17200 Nicotine dependence, unspecified, uncomplicated: Secondary | ICD-10-CM | POA: Insufficient documentation

## 2022-08-09 DIAGNOSIS — I2510 Atherosclerotic heart disease of native coronary artery without angina pectoris: Secondary | ICD-10-CM | POA: Diagnosis not present

## 2022-08-09 LAB — BASIC METABOLIC PANEL
Anion gap: 7 (ref 5–15)
BUN: 9 mg/dL (ref 6–20)
CO2: 29 mmol/L (ref 22–32)
Calcium: 8.5 mg/dL — ABNORMAL LOW (ref 8.9–10.3)
Chloride: 105 mmol/L (ref 98–111)
Creatinine, Ser: 0.83 mg/dL (ref 0.61–1.24)
GFR, Estimated: >60 mL/min (ref >60)
Glucose, Bld: 122 mg/dL — ABNORMAL HIGH (ref 70–99)
Potassium: 4.4 mmol/L (ref 3.5–5.1)
Sodium: 141 mmol/L (ref 135–145)

## 2022-08-09 LAB — CBC
HCT: 39.9 % (ref 39.0–52.0)
Hemoglobin: 13.1 g/dL (ref 13.0–17.0)
MCH: 30.5 pg (ref 26.0–34.0)
MCHC: 32.8 g/dL (ref 30.0–36.0)
MCV: 93 fL (ref 80.0–100.0)
Platelets: 238 10*3/uL (ref 150–400)
RBC: 4.29 MIL/uL (ref 4.22–5.81)
RDW: 13.7 % (ref 11.5–15.5)
WBC: 5.3 10*3/uL (ref 4.0–10.5)
nRBC: 0 % (ref 0.0–0.2)

## 2022-08-09 NOTE — Progress Notes (Addendum)
COVID Vaccine received:   No [x]  Yes Date of any COVID positive Test in last 68 days:No  PCP - None Cardiologist - None  Chest x-ray -  EKG - 08/09/22  On chart Stress Test - denies ECHO - denies Cardiac Cath - denies  Bowel Prep - [x]  No  []   Yes ______  Pacemaker / ICD device [x]  No []  Yes   Spinal Cord Stimulator:[]  No []  Yes       History of Sleep Apnea? [x]  No []  Yes   CPAP used?- [x]  No []  Yes    Does the patient monitor blood sugar?          [x]  No []  Yes  []  N/A  Patient has: [x]  NO Hx DM   []  Pre-DM                 []  DM1  []   DM2 Does patient have a [employer] or [employer]? [x]  No []  Yes   Fasting Blood Sugar Ranges-  Checks Blood Sugar _____ times a day  GLP1 agonist / usual dose - No GLP1 instructions:  SGLT-2 inhibitors / usual dose - No SGLT-2 instructions:   Blood Thinner / Instructions:No Aspirin Instructions:  Comments:   Activity level: Patient is able  to climb a flight of stairs without difficulty; [x]  No CP  [x]  No SOB.  Patient can  perform ADLs without assistance.   Anesthesia review: Ureter CA,Smoker, Oropharyngeal mass  Patient denies shortness of breath, fever, cough and chest pain at PAT appointment.  Patient verbalized understanding and agreement to the Pre-Surgical Instructions that were given to them at this PAT appointment. Patient was also educated of the need to review these PAT instructions again prior to his/her surgery.I reviewed the appropriate phone numbers to call if they have any and questions or concerns.

## 2022-08-12 ENCOUNTER — Encounter (HOSPITAL_COMMUNITY): Payer: Self-pay

## 2022-08-12 NOTE — Progress Notes (Addendum)
Case: [id number] Date/Time: 08/14/22 1345   Procedure: CYSTOSCOPY WITH RIGHT URETEROSCOPY, POSSIBLE ABLATION, POSSIBLE BIOPSY OF URETERAL MASS, AND RIGHT STENT EXCHANGE VS. REMOVAL (Right) - 60 MINUTES   Anesthesia type: General   Pre-op diagnosis: RIGHT URETERAL CANCER   Location: [facility]   Surgeons: [name], MD       DISCUSSION: Glen Mcmillan is a 57 yo male who presents to PAT prior to surgery listed above. Patient had known obstructing right ureteral tumor s/p resection by Dr. Marlou Porch on 01/09/22. Per Dr. 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 He was released in May of this year and presented to the ED on 07/17/22 due to complains of a dysphagia and a large oropharyngeal mass was discovered on CT neck. ENT was consulted and recommended biopsy however patient declined and has not followed up with ENT as outpatient yet - appointment is scheduled for 6/14.  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   No prior anesthesia complications.  Prior cardiac w/u remarkable for cardiac cath and echo in 2022 in [place] for chest pain. No cardiology notes are available but work up appeared overall reassuring. Cath showed mild non-obstructive CAD and medical management recommended. Echo showed mild LVH, normal EF, and no significant valvular disease.  Discussed case with Dr. Tacy Dura who recommends ENT clearance prior to proceeding. Notified Breana Litts at [facility].  Addendum 08/23/2022:  Spoke with Dr. Ishmael Holter with ENT on 08/22/2022. Per Dr. Chestine Spore the mass has decreased in size, she is unsure the explanation for this.  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  Dr. Chestine Spore scoped the patient and mass  did not involve the pharynx, it is submucosal.  Airway is very patient and should be an easy intubation.  She reports the rusk would be scraping the mass causing bleeding, but when performing the biopsy hemostasis intervention was not required. Dr. Chestine Spore recommends go from the left with intubation or use transoral fiberoptic intubation.  Dr. Chestine Spore is available for if needed.  VS: BP 135/82   Pulse 72   Temp 36.7 C (Oral)   Resp 18   Ht 5\' 8"  (1.727 m)   Wt 86.6 kg   SpO2 100%   BMI 29.04 kg/m   PROVIDERS: Patient, No Pcp Per   LABS: Labs reviewed: Acceptable for surgery. (all labs ordered are listed, but only abnormal results are displayed)  Labs Reviewed  BASIC METABOLIC PANEL - Abnormal; Notable for the following components:      Result Value   Glucose, Bld 122 (*)    Calcium 8.5 (*)    All other components within normal limits  CBC     IMAGES:  CT soft tissue neck 07/17/22:  IMPRESSION:  1. 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 Recommend ENT consultation for further evaluation and management.  2.  Enlarged left palatine tonsil, which warrants correlation with direct inspection.  3.  No findings specific for cervical nodal metastasis.  4.  Poor dentition with multiple caries and periapical lucencies involving multiple mandibular teeth, partially imaged.  5.  Multiple rounded metallic density foreign bodies in  the soft tissues of the face.   CT chest/abdomen/pelvis 07/17/22:   IMPRESSION:  1.  No specific evidence of metastatic disease in the chest, abdomen, or pelvis within the limitations of noncontrast technique.  2.  Multiple peripheral solid pulmonary micronodules, nonspecific. A follow-up chest CT in 3 months could reevaluate.  3.  Mild right hydronephrosis with with appropriate position of a ureteral stent. Associated  thickening and stranding of the renal pelvis and proximal to mid ureter.   EKG 08/09/22:  NSR Poor anterior R wave progression No significant change found from prior EKG   CV:   Cardiac Cath 02/15/2021:  1.  Left main is normal sized vessel, which gives off a circumflex and LAD and  has mild plaque proximally, but no significant stenosis.  2.  The LAD is a nondominant vessel.  There is a normal sized vessel, which  gives off one major diagonal, has a 20-30% stenosis in the midsegment after the  first diagonal before reaching the apex.  3.  The circumflex is a nondominant vessel, which gives off one major OM, has no  significant stenosis noted.  4.  The RCA is a dominant vessel, which gives off the PDA and posterolateral  branch and has no significant stenosis noted.   Following this, we exchanged over a wire for a pigtail catheter which was used  to enter the ventricle and measure LVEDP which was normal at 8.  Hand injection   IMPRESSION AND PLAN:  Mild nonobstructive coronary artery disease.  Continue with medical therapy for  his known cardiovascular risk factors as per internal medicine service.  Radial  compression device to be removed starting in 2 hours.   Echo 09/08/2020:  Cardiac Anatomy   Left ventricle:  The cavity size is normal. Wall thickness was  increased in a pattern of mild LVH. Systolic function was normal.  The estimated ejection fraction was 60-65%. Global Longitudinal   Peak Strain was calculated -19%. Regional wall motion was normal.  Left ventricular diastolic function was normal.   Right ventricle:  The cavity size is normal. Wall thickness is  normal. Systolic function is normal. Systolic pressure was within  the normal range.   Left atrium:  The atrium is normal in size.   Right atrium:  The atrium is normal in size.   Aortic valve:   Trileaflet; mildly thickened leaflets. No  significant valve disease. Cusp separation was normal.  Doppler:   Transvalvular velocity is within the normal range. There is no  stenosis. There is no regurgitation.   Mitral valve:   Structurally normal valve. No significant valve  disease.    Doppler:  There was no significant regurgitation.   Tricuspid valve:   Structurally normal valve. No significant valve  disease.    Doppler:  There is no significant regurgitation.   Pulmonic valve:    Structurally normal valve. No significant valve  disease.   Cusp separation was normal.  Doppler:  Transvalvular  velocity is within the normal range. There is no regurgitation.   Aorta:  Aortic root: The aortic root is normal in size.   Pulmonary artery:   Insufficient TR jet to estimate pulmonary  artery systolic pressure.   Systemic veins:     Inferior vena cava: The vessel was normal in  size. The respirophasic diameter changes were in the normal range  (>= 50%), consistent with normal central venous pressure.   Pericardium:  There is no pericardial effusion.   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   discomfort with swallowing, voice changes   Legally blind    both eyes   Ureter cancer, right [facility]) 12/2021   urologist--- dr Marlou Porch;   dx 12-12-2021  s/p  ureteral bx ,  noninvasive low grade papillary urothelial cnacer    Past Surgical History:  Procedure Laterality Date   APPENDECTOMY     CYSTOSCOPY W/ URETERAL STENT PLACEMENT Bilateral 12/12/2021   Procedure: CYSTOSCOPY WITH BILATERAL RETROGRADE PYELOGRAM/URETERAL DILATION/URETEROSCOPY/URETERAL STENT PLACEMENT/RIGHT DIAGNOSTIC URETEROSCOPY, URETERAL BIOPSY;LEFT LASER LITHOTRISPY/STONE EXTRACTION;  Surgeon: [name], MD;  Location: [facility];  Service: Urology;  Laterality: Bilateral;   CYSTOSCOPY WITH STENT PLACEMENT Bilateral 01/09/2022   Procedure: LEFT STENT REMOVAL, RIGHT STENT EXCHANGE;  Surgeon: [name], MD;   [facility];  Service: Urology;  Laterality: Bilateral;   EYE SURGERY     MASS EXCISION Right 01/09/2022   Procedure: RIGHT URETERAL MASS EXCISION;  Surgeon: [name], MD;  Location: [facility];  Service: Urology;  Laterality: Right;    MEDICATIONS:  hydrOXYzine (VISTARIL) 25 MG capsule   latanoprost (XALATAN) 0.005 % ophthalmic solution   naproxen (NAPROSYN) 375 MG tablet   phenazopyridine (PYRIDIUM) 100 MG tablet   Polyvinyl Alcohol (LIQUID TEARS OP)   tamsulosin (FLOMAX) 0.4 MG CAPS capsule   timolol (TIMOPTIC) 0.25 % ophthalmic solution   traMADol (ULTRAM) 50 MG tablet   No current facility-administered medications for this encounter.   Marcille Blanco [facility] Short Stay/Anesthesiology [facility] Phone [phone] 08/12/2022 11:40 AM

## 2022-08-14 ENCOUNTER — Encounter (HOSPITAL_COMMUNITY): Admission: RE | Payer: Self-pay | Source: Home / Self Care

## 2022-08-14 ENCOUNTER — Ambulatory Visit (HOSPITAL_COMMUNITY): Admission: RE | Admit: 2022-08-14 | Payer: PRIVATE HEALTH INSURANCE | Source: Home / Self Care | Admitting: Urology

## 2022-08-14 HISTORY — DX: Legal blindness, as defined in USA: H54.8

## 2022-08-14 SURGERY — CYSTOURETEROSCOPY, WITH STENT INSERTION
Anesthesia: General | Laterality: Right

## 2022-08-23 ENCOUNTER — Other Ambulatory Visit: Payer: Self-pay

## 2022-08-23 NOTE — Anesthesia Preprocedure Evaluation (Addendum)
Anesthesia Evaluation  Patient identified by MRN, date of birth, ID band Patient awake    Reviewed: Allergy & Precautions, NPO status , Patient's Chart, lab work & pertinent test results  Airway Mallampati: II  TM Distance: >3 FB Neck ROM: Full    Dental no notable dental hx.    Pulmonary Current Smoker and Patient abstained from smoking.   Pulmonary exam normal breath sounds clear to auscultation       Cardiovascular Normal cardiovascular exam Rhythm:Regular Rate:Normal  2022 Cath showed mild non-obstructive CAD and medical management recommended. Echo showed mild LVH, normal EF, and no significant valvular disease   Neuro/Psych negative neurological ROS  negative psych ROS   GI/Hepatic negative GI ROS, Neg liver ROS,,,  Endo/Other  negative endocrine ROS    Renal/GU negative Renal ROS  negative genitourinary   Musculoskeletal negative musculoskeletal ROS (+)    Abdominal   Peds  Hematology negative hematology ROS (+)   Anesthesia Other Findings Right ureteral CA  Right Oropharyngeal mass Spoke with Dr. Ishmael Holter with ENT on 08/22/2022. Per Dr. Chestine Spore the mass has decreased in size, she is unsure the explanation for this.  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  Dr. Chestine Spore scoped the patient and mass did not involve the pharynx, it is submucosal.  Airway is very patent and should be an easy intubation.   Reproductive/Obstetrics                             Anesthesia Physical Anesthesia Plan  ASA: 2  Anesthesia Plan: General   Post-op Pain Management: Tylenol PO (pre-op)*   Induction: Intravenous and Rapid sequence  PONV Risk Score and Plan: 3 and Midazolam, Dexamethasone and Ondansetron  Airway Management Planned: Oral ETT and Video Laryngoscope Planned  Additional Equipment:   Intra-op Plan:   Post-operative Plan: Extubation in OR  Informed Consent: I have  reviewed the patients History and Physical, chart, labs and discussed the procedure including the risks, benefits and alternatives for the proposed anesthesia with the patient or authorized representative who has indicated his/her understanding and acceptance.     Dental advisory given  Plan Discussed with: CRNA and Surgeon  Anesthesia Plan Comments:        Anesthesia Quick Evaluation

## 2022-08-23 NOTE — Progress Notes (Signed)
Pt. Was call to review the instructions for the upcoming procedure on 08/28/22 with Dr. Marlou Porch.Pt. is aware to report at 1:30 to admitting, also to be NPO after midnight the day before surgery.

## 2022-08-28 ENCOUNTER — Ambulatory Visit (HOSPITAL_COMMUNITY): Payer: Medicaid Other | Admitting: Physician Assistant

## 2022-08-28 ENCOUNTER — Encounter (HOSPITAL_COMMUNITY)
Admission: RE | Disposition: A | Discharge status: Home / Self Care | Payer: Self-pay | Source: Home / Self Care | Attending: Urology

## 2022-08-28 ENCOUNTER — Ambulatory Visit (HOSPITAL_COMMUNITY)
Admission: RE | Admit: 2022-08-28 | Discharge: 2022-08-28 | Disposition: A | Discharge status: Home / Self Care | Payer: Medicaid Other | Attending: Urology | Admitting: Urology

## 2022-08-28 ENCOUNTER — Ambulatory Visit (HOSPITAL_COMMUNITY): Payer: Medicaid Other

## 2022-08-28 ENCOUNTER — Ambulatory Visit (HOSPITAL_BASED_OUTPATIENT_CLINIC_OR_DEPARTMENT_OTHER): Payer: Medicaid Other | Admitting: Physician Assistant

## 2022-08-28 ENCOUNTER — Encounter (HOSPITAL_COMMUNITY): Payer: Self-pay | Admitting: Urology

## 2022-08-28 ENCOUNTER — Other Ambulatory Visit: Payer: Self-pay

## 2022-08-28 DIAGNOSIS — C661 Malignant neoplasm of right ureter: Secondary | ICD-10-CM | POA: Insufficient documentation

## 2022-08-28 DIAGNOSIS — F17200 Nicotine dependence, unspecified, uncomplicated: Secondary | ICD-10-CM | POA: Diagnosis not present

## 2022-08-28 DIAGNOSIS — N2889 Other specified disorders of kidney and ureter: Secondary | ICD-10-CM

## 2022-08-28 DIAGNOSIS — F17210 Nicotine dependence, cigarettes, uncomplicated: Secondary | ICD-10-CM | POA: Diagnosis not present

## 2022-08-28 HISTORY — PX: URETERAL BIOPSY: SHX6688

## 2022-08-28 HISTORY — PX: CYSTOSCOPY WITH URETEROSCOPY AND STENT PLACEMENT: SHX6377

## 2022-08-28 SURGERY — CYSTOURETEROSCOPY, WITH STENT INSERTION
Anesthesia: General | Site: Ureter | Laterality: Right

## 2022-08-28 MED ORDER — IOHEXOL 300 MG/ML  SOLN
INTRAMUSCULAR | Status: DC | PRN
  Administered 2022-08-28: 8 mL
  Administered 2022-08-28: 50 mL

## 2022-08-28 MED ORDER — LACTATED RINGERS IV SOLN: INTRAVENOUS | Status: DC

## 2022-08-28 MED ORDER — ACETAMINOPHEN 500 MG PO TABS
1000.0000 mg | ORAL_TABLET | Freq: Once | ORAL | Status: AC
  Administered 2022-08-28: 1000 mg via ORAL
  Filled 2022-08-28: qty 2

## 2022-08-28 MED ORDER — FENTANYL CITRATE (PF) 100 MCG/2ML IJ SOLN
INTRAMUSCULAR | Status: AC
  Filled 2022-08-28: qty 2

## 2022-08-28 MED ORDER — SUGAMMADEX SODIUM 200 MG/2ML IV SOLN
INTRAVENOUS | Status: DC | PRN
  Administered 2022-08-28: 300 mg via INTRAVENOUS

## 2022-08-28 MED ORDER — CIPROFLOXACIN IN D5W 400 MG/200ML IV SOLN
INTRAVENOUS | Status: AC
  Filled 2022-08-28: qty 200

## 2022-08-28 MED ORDER — MIDAZOLAM HCL 2 MG/2ML IJ SOLN
INTRAMUSCULAR | Status: AC
  Filled 2022-08-28: qty 2

## 2022-08-28 MED ORDER — LIDOCAINE HCL (PF) 2 % IJ SOLN
INTRAMUSCULAR | Status: AC
  Filled 2022-08-28: qty 5

## 2022-08-28 MED ORDER — PROPOFOL 10 MG/ML IV BOLUS
INTRAVENOUS | Status: DC | PRN
  Administered 2022-08-28: 150 mg via INTRAVENOUS

## 2022-08-28 MED ORDER — EPHEDRINE SULFATE (PRESSORS) 50 MG/ML IJ SOLN
INTRAMUSCULAR | Status: DC | PRN
  Administered 2022-08-28 (×2): 10 mg via INTRAVENOUS

## 2022-08-28 MED ORDER — MIDAZOLAM HCL 5 MG/5ML IJ SOLN
INTRAMUSCULAR | Status: DC | PRN
  Administered 2022-08-28: 2 mg via INTRAVENOUS

## 2022-08-28 MED ORDER — LACTATED RINGERS IV SOLN: INTRAVENOUS | Status: DC | PRN

## 2022-08-28 MED ORDER — ROCURONIUM BROMIDE 100 MG/10ML IV SOLN
INTRAVENOUS | Status: DC | PRN
  Administered 2022-08-28: 10 mg via INTRAVENOUS
  Administered 2022-08-28: 30 mg via INTRAVENOUS

## 2022-08-28 MED ORDER — FENTANYL CITRATE (PF) 100 MCG/2ML IJ SOLN
INTRAMUSCULAR | Status: DC | PRN
  Administered 2022-08-28 (×2): 50 ug via INTRAVENOUS
  Administered 2022-08-28: 100 ug via INTRAVENOUS

## 2022-08-28 MED ORDER — ONDANSETRON HCL 4 MG/2ML IJ SOLN
INTRAMUSCULAR | Status: AC
  Filled 2022-08-28: qty 2

## 2022-08-28 MED ORDER — TAMSULOSIN HCL 0.4 MG PO CAPS: 0.4000 mg | ORAL_CAPSULE | Freq: Every day | ORAL | 0 refills | Status: AC

## 2022-08-28 MED ORDER — PROPOFOL 10 MG/ML IV BOLUS
INTRAVENOUS | Status: AC
  Filled 2022-08-28: qty 20

## 2022-08-28 MED ORDER — ONDANSETRON HCL 4 MG/2ML IJ SOLN
INTRAMUSCULAR | Status: DC | PRN
  Administered 2022-08-28: 4 mg via INTRAVENOUS

## 2022-08-28 MED ORDER — SUCCINYLCHOLINE CHLORIDE 200 MG/10ML IV SOSY
PREFILLED_SYRINGE | INTRAVENOUS | Status: DC | PRN
  Administered 2022-08-28: 120 mg via INTRAVENOUS

## 2022-08-28 MED ORDER — DEXAMETHASONE SODIUM PHOSPHATE 10 MG/ML IJ SOLN
INTRAMUSCULAR | Status: AC
  Filled 2022-08-28: qty 1

## 2022-08-28 MED ORDER — PHENYLEPHRINE 80 MCG/ML (10ML) SYRINGE FOR IV PUSH (FOR BLOOD PRESSURE SUPPORT)
PREFILLED_SYRINGE | INTRAVENOUS | Status: AC
  Filled 2022-08-28: qty 20

## 2022-08-28 MED ORDER — CIPROFLOXACIN IN D5W 400 MG/200ML IV SOLN
INTRAVENOUS | Status: DC | PRN
  Administered 2022-08-28: 400 mg via INTRAVENOUS

## 2022-08-28 MED ORDER — FENTANYL CITRATE PF 50 MCG/ML IJ SOSY: 25.0000 ug | PREFILLED_SYRINGE | INTRAMUSCULAR | Status: DC | PRN

## 2022-08-28 MED ORDER — CHLORHEXIDINE GLUCONATE 0.12 % MT SOLN
15.0000 mL | Freq: Once | OROMUCOSAL | Status: AC
  Administered 2022-08-28: 15 mL via OROMUCOSAL

## 2022-08-28 MED ORDER — SODIUM CHLORIDE 0.9 % IR SOLN
Status: DC | PRN
  Administered 2022-08-28: 3000 mL

## 2022-08-28 MED ORDER — DEXAMETHASONE SODIUM PHOSPHATE 4 MG/ML IJ SOLN
INTRAMUSCULAR | Status: DC | PRN
  Administered 2022-08-28: 5 mg via INTRAVENOUS

## 2022-08-28 MED ORDER — LIDOCAINE HCL (CARDIAC) PF 100 MG/5ML IV SOSY
PREFILLED_SYRINGE | INTRAVENOUS | Status: DC | PRN
  Administered 2022-08-28: 80 mg via INTRAVENOUS

## 2022-08-28 SURGICAL SUPPLY — 23 items
BAG URO CATCHER STRL LF (MISCELLANEOUS) ×1 IMPLANT
BASKET ZERO TIP NITINOL 2.4FR (BASKET) IMPLANT
BSKT STON RTRVL ZERO TP 2.4FR (BASKET) ×1
CATH URETERAL DUAL LUMEN 10F (MISCELLANEOUS) IMPLANT
CATH URETL OPEN 5X70 (CATHETERS) ×1 IMPLANT
CLOTH BEACON ORANGE TIMEOUT ST (SAFETY) ×1 IMPLANT
EXTRACTOR STONE 1.7FRX115CM (UROLOGICAL SUPPLIES) IMPLANT
GLOVE SURG LX STRL 7.5 STRW (GLOVE) ×1 IMPLANT
GOWN STRL REUS W/ TWL XL LVL3 (GOWN DISPOSABLE) ×1 IMPLANT
GOWN STRL REUS W/TWL XL LVL3 (GOWN DISPOSABLE) ×1
GUIDEWIRE ANG ZIPWIRE 038X150 (WIRE) IMPLANT
GUIDEWIRE STR DUAL SENSOR (WIRE) ×1 IMPLANT
KIT TURNOVER KIT A (KITS) IMPLANT
LASER FIB FLEXIVA PULSE ID 365 (Laser) IMPLANT
MANIFOLD NEPTUNE II (INSTRUMENTS) ×1 IMPLANT
PACK CYSTO (CUSTOM PROCEDURE TRAY) ×1 IMPLANT
SHEATH NAVIGATOR HD 12/14X28 (SHEATH) IMPLANT
SHEATH NAVIGATOR HD 12/14X36 (SHEATH) IMPLANT
STENT URET 6FRX26 CONTOUR (STENTS) IMPLANT
TRACTIP FLEXIVA PULS ID 200XHI (Laser) IMPLANT
TRACTIP FLEXIVA PULSE ID 200 (Laser)
TUBING CONNECTING 10 (TUBING) ×1 IMPLANT
TUBING UROLOGY SET (TUBING) ×1 IMPLANT

## 2022-08-28 NOTE — Op Note (Addendum)
Preoperative diagnosis: Right ureteral mass  Postoperative diagnosis: same Procedure:  Cystoscopy Right ureteroscopy  Right ureteral tumor ablation  right 9F x 26 ureteral stent placement  right retrograde pyelography with interpretation  Surgeon: [name], MD  Assistants: Jerald Kief, MD, PhD  Anesthesia: General  Complications: None  Intraoperative findings: -Right retrograde pyelography demonstrated a filling defect within the mid ureter consistent with the patient's known previous cancer  -Ureteroscopy revealed a 1 cm mass in the mid right ureter, successfully ablated -successful 33fr x 26cm JJ stent placement.   EBL: Minimal  Specimens: Right ureteral mass for pathology   Disposition of specimens: [facility] for pathology   Indication: [name]. is a 57 y.o.  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 He was lost to follow up and now returns for second look ureteroscopy.  After reviewing the management options for treatment, the patient elected to proceed with the above surgical procedure(s). We have discussed the potential benefits and risks of the procedure, side effects of the proposed treatment, the likelihood of the patient achieving the goals of the procedure, and any potential problems that might occur during the procedure or recuperation. Informed consent has been obtained.   Description of procedure:  The patient was taken to the operating room and general anesthesia was induced.  The patient was placed in the dorsal lithotomy position, prepped and draped in the usual sterile fashion, and preoperative antibiotics were administered. A preoperative time-out was performed.   Cystourethroscopy was performed.  [name] urethra was examined and was normal with bilobar prostate. The bladder was then systematically examined in its entirety. There was no evidence for any bladder  tumors, stones, or other mucosal pathology.    Attention then turned to the right ureteral orifice which had evidence of stent edema. The right ureteral stent was mildly encrusted. Using a 5Fr open ended, a sensor wire was passed along side the ureteral stent. The rigid cystoscope was removed and the wire was left in place. We then went back in with the rigid cystoscope and a grasper was used to remove the stent. The stent was removed intact. Next a dual lumen was introduced over the sensor wire under flourscopic guidance.  A retrograde pyelogram was performed at the level of the distal ureter and showed the finding above.     The 6 Fr semirigid ureteroscope was then advanced into the ureter next to the guidewire and the 1cm mass was identified in the mid ureter. We introduced a zero tip nitinol basket through the scope and attempted to snare the mass. We were able to obtain a small piece of the mass which was sent for pathology. We made the decision to ablate the tumor. The scope was removed a dual lumen catheter was used to place a second wire.  A 13/15 Fr sheath was passed over the wire, passing the inner first and then the outer under fluoroscopic guidance. We then proceeded to ablate the mass using a 200 micron holmium laser fiber on the settings of 1 and frequency of 10 Hz. Once the tumor was ablated, the nitinol basket was introduced and a larger piece of tumor was snared and sent for pathology. We were able to ablate most of the tumor. The sheath was removed.   The wire was then backloaded through the cystoscope and a ureteral stent was advance over the wire using Seldinger technique.  The stent was positioned  appropriately under fluoroscopic and cystoscopic guidance.  We placed a 6Fr x 26 cm JJ stent. The wire was then removed with an adequate stent curl noted in the renal pelvis as well as in the bladder. There was no string on the stent.   The bladder was then emptied and the procedure ended.  The  patient appeared to tolerate the procedure well and without complications.  The patient was able to be awakened and transferred to the recovery unit in satisfactory condition.   Disposition: Ok for discharge today Follow up in 3 month for revaluation of the ureter.  Of note, patient has throat cancer and will undergo either chemo vs chemo/rads.

## 2022-08-28 NOTE — Anesthesia Procedure Notes (Addendum)
Procedure Name: Intubation Date/Time: 08/28/2022 5:19 PM  Performed by: Deri Fuelling, CRNAPre-anesthesia Checklist: Patient identified, Emergency Drugs available, Suction available and Patient being monitored Patient Re-evaluated:Patient Re-evaluated prior to induction Oxygen Delivery Method: Circle system utilized Preoxygenation: Pre-oxygenation with 100% oxygen Induction Type: IV induction and Rapid sequence Ventilation: Mask ventilation without difficulty Laryngoscope Size: Glidescope and 4 Grade View: Grade I Tube type: Oral Tube size: 7.5 mm Number of attempts: 1 Airway Equipment and Method: Stylet and Oral airway Placement Confirmation: ETT inserted through vocal cords under direct vision, positive ETCO2 and breath sounds checked- equal and bilateral Tube secured with: Tape Dental Injury: Teeth and Oropharynx as per pre-operative assessment

## 2022-08-28 NOTE — Anesthesia Postprocedure Evaluation (Signed)
Anesthesia Post Note  Patient: [name] Sr.  Procedure(s) Performed: CYSTOSCOPY WITH  RIGHT URETEROSCOPY AND RIGHT URETERAL  STENT EXCHANGE (Right: Ureter) ABLATION AND BIOPSY OF URETERAL MASS/TUMOR (Right: Ureter)     Patient location during evaluation: PACU Anesthesia Type: General Level of consciousness: awake and alert Pain management: pain level controlled Vital Signs Assessment: post-procedure vital signs reviewed and stable Respiratory status: spontaneous breathing, nonlabored ventilation, respiratory function stable and patient connected to nasal cannula oxygen Cardiovascular status: blood pressure returned to baseline and stable Postop Assessment: no apparent nausea or vomiting Anesthetic complications: no  No notable events documented.  Last Vitals:  Vitals:   08/28/22 1900 08/28/22 1915  BP: 131/87 134/87  Pulse: 86 82  Resp: 16 18  Temp:    SpO2: 98% 93%    Last Pain:  Vitals:   08/28/22 1851  TempSrc:   PainSc: 0-No pain                 [address]

## 2022-08-28 NOTE — Discharge Instructions (Addendum)
DISCHARGE INSTRUCTIONS FOR KIDNEY STONE/URETERAL STENT   MEDICATIONS:  1.  Resume all your other meds from home - except do not take any extra narcotic pain meds that you may have at home.  2. Pyridium is to help with the burning/stinging when you urinate. 3. Tramadol is for moderate/severe pain, otherwise taking upto 1000 mg every 6 hours of plainTylenol will help treat your pain.   4. Take Cipro one hour prior to removal of your stent.   ACTIVITY:  1. No strenuous activity x 1week  2. No driving while on narcotic pain medications  3. Drink plenty of water  4. Continue to walk at home - you can still get blood clots when you are at home, so keep active, but don't over do it.  5. May return to work/school tomorrow or when you feel ready   BATHING:  1. You can shower and we recommend daily showers  2. You have a string coming from your urethra: The stent string is attached to your ureteral stent. Do not pull on this.   SIGNS/SYMPTOMS TO CALL:  Please call us if you have a fever greater than 101.5, uncontrolled nausea/vomiting, uncontrolled pain, dizziness, unable to urinate, bloody urine, chest pain, shortness of breath, leg swelling, leg pain, redness around wound, drainage from wound, or any other concerns or questions.   You can reach [place] at [phone].   FOLLOW-UP:  1. Follow up in 3 month for a second look ureteroscopy to evaluate the ureter.  2. You have an indwelling stent that will remain in place until next procedure.

## 2022-08-28 NOTE — H&P (Signed)
57 year old man who initially presented with a left proximal ureteral stone and right-sided hydronephrosis. He was subsequently diagnosed with a right ureteral mass. 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 1 month later we went back to the operating room for a 1.5 x 1.5 cm with several tumors along the course of the ureter. The tumors were lasered off and then lassoed. Was difficult to know if they have been completely excised. 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   The patient is been lost to follow-up, since his last surgeries been incarcerated. He still has a stent in his right ureter. Our plan from the last surgery was to follow-up in 6 weeks for repeat ureteroscopy to ensure that the tumor had been completely excised.   Prior visitl: The patient presents today for further management. He has been tolerating the stent well. Denies any dysuria or gross hematuria. Is not having any pain. He remains incarcerated with no trial date.   08/01/2022: Returns today in follow-up. Reports he missed his prior procedures because he was incarcerated and they would not bring him for his procedure. He reportedly has a lawsuit against the jail for not bringing him. Other medical issues include a growth in his mouth that he is waiting to get evaluated.   At present, reports dysuria, hematuria, frequency. He denies seeing blood clots, but has vision troubles. He denies fevers, constitutional symptoms, weight loss.     ALLERGIES: None   MEDICATIONS: Oxycodone Hcl 5 mg capsule 1 capsule PO QID PRN     GU PSH: Cysto Uretero Biopsy Fulgura - 01/09/2022 Cystoscopy Insert Stent - 01/09/2022     NON-GU PSH: None   GU PMH: Right ureteral cancer - 04/02/2022, - 01/31/2022 Ureteral calculus - 01/31/2022    NON-GU PMH: None   FAMILY HISTORY: None   SOCIAL HISTORY: None   REVIEW OF SYSTEMS:    GU Review Male:    Patient reports frequent urination. Patient denies hard to postpone urination, burning/ pain with urination, get up at night to urinate, leakage of urine, stream starts and stops, trouble starting your stream, have to strain to urinate , erection problems, and penile pain.  Gastrointestinal (Upper):   Patient denies nausea, vomiting, and indigestion/ heartburn.  Gastrointestinal (Lower):   Patient denies diarrhea and constipation.  Constitutional:   Patient denies fever, night sweats, weight loss, and fatigue.  Skin:   Patient denies skin rash/ lesion and itching.  Eyes:   Patient denies blurred vision and double vision.  Ears/ Nose/ Throat:   Patient denies sore throat and sinus problems.  Hematologic/Lymphatic:   Patient denies swollen glands and easy bruising.  Cardiovascular:   Patient denies leg swelling and chest pains.  Respiratory:   Patient denies cough and shortness of breath.  Endocrine:   Patient denies excessive thirst.  Musculoskeletal:   Patient denies joint pain and back pain.  Neurological:   Patient denies headaches and dizziness.  Psychologic:   Patient denies depression and anxiety.   VITAL SIGNS:      08/01/2022 02:00 PM  BP 127/74 mmHg  Heart Rate 100 /min  Temperature 98.2 F / 36.7 C   MULTI-SYSTEM PHYSICAL EXAMINATION:    Constitutional: Well-nourished. No physical deformities. Normally developed. Good grooming.   Respiratory: No labored breathing, no use of accessory muscles.   Skin: No paleness, no jaundice, no cyanosis. No lesion, no ulcer, no rash.   Neurologic / Psychiatric: Oriented to time, oriented to place,  oriented to person. No depression, no anxiety, no agitation.      PAST DATA REVIEW: None   PROCEDURES:          Urinalysis w/Scope - 81001 Dipstick Dipstick Cont'd Micro  Color: Yellow Bilirubin: Neg WBC/hpf: 6 - 10/hpf  Appearance: Slightly Cloudy Ketones: Neg RBC/hpf: 10 - 20/hpf  Specific Gravity: 1.015 Blood: 3+ Bacteria: Rare (0-9/hpf)   pH: 8.0 Protein: 2+ Cystals: NS (Not Seen)  Glucose: Neg Urobilinogen: 0.2 Casts: NS (Not Seen)    Nitrites: Neg Trichomonas: Not Present    Leukocyte Esterase: 1+ Mucous: Present      Epithelial Cells: NS (Not Seen)      Yeast: NS (Not Seen)      Sperm: Not Present    Notes:      ASSESSMENT:      ICD-10 Details  1 GU:   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 He expressed understanding.   #Mouth lesion  - Discussed with patient he should have this evaluated with his PCP or through the ED. He was advised to present to the ED if the mass posed any issues with breathing, swallowing, eating/drinking, etc. He expressed understanding    PLAN:           Orders Labs Urine Culture          Schedule Procedure: Unspecified Date - Cysto Uretero W/excise Tumor - 02725          Document Letter(s):  Created for Patient: Clinical Summary         Notes:   Supervised by Dr. Marlou Porch. Will schedule for outpatient cysto, R URS, possible biopsy/fulguration, R stent exchange vs removal

## 2022-08-28 NOTE — Interval H&P Note (Signed)
History and Physical Interval Note:  08/28/2022 4:50 PM  Glen Anis Sr.  has presented today for surgery, with the diagnosis of RIGHT URETERAL CANCER.  The various methods of treatment have been discussed with the patient and family. After consideration of risks, benefits and other options for treatment, the patient has consented to  Procedure(s) with comments: CYSTOSCOPY WITH  RIGHT URETEROSCOPY AND RIGHT URETERAL  STENT EXCHANGE VS. REMOVAL (Right) - 60 MINUTES POSSIBLE ABLATION AND POSSIBLE BIOPSY OF URETERAL MASS (Right) as a surgical intervention.  [name] history has been reviewed, patient examined, no change in status, stable for surgery.  I have reviewed the patient's chart and labs.  Questions were answered to the patient's satisfaction.     [name]

## 2022-08-28 NOTE — Transfer of Care (Signed)
Immediate Anesthesia Transfer of Care Note  Patient: [name] Sr.  Procedure(s) Performed: CYSTOSCOPY WITH  RIGHT URETEROSCOPY AND RIGHT URETERAL  STENT EXCHANGE (Right: Ureter) ABLATION AND BIOPSY OF URETERAL MASS/TUMOR (Right: Ureter)  Patient Location: PACU  Anesthesia Type:General  Level of Consciousness: awake, alert , and oriented  Airway & Oxygen Therapy: Patient Spontanous Breathing and Patient connected to face mask oxygen  Post-op Assessment: Report given to RN and Post -op Vital signs reviewed and stable  Post vital signs: Reviewed and stable  Last Vitals:  Vitals Value Taken Time  BP 136/88 08/28/22 1851  Temp    Pulse 87 08/28/22 1855  Resp 17 08/28/22 1855  SpO2 98 % 08/28/22 1855  Vitals shown include unvalidated device data.  Last Pain:  Vitals:   08/28/22 1420  TempSrc: Oral  PainSc: 0-No pain         Complications: No notable events documented.

## 2022-08-29 ENCOUNTER — Encounter (HOSPITAL_COMMUNITY): Payer: Self-pay | Admitting: Urology

## 2022-08-30 LAB — SURGICAL PATHOLOGY

## 2022-09-23 ENCOUNTER — Other Ambulatory Visit: Payer: Self-pay

## 2022-09-23 ENCOUNTER — Inpatient Hospital Stay
Admission: RE | Admit: 2022-09-23 | Discharge: 2022-09-23 | Disposition: A | Discharge status: Home / Self Care | Payer: Self-pay | Source: Ambulatory Visit | Attending: Radiation Oncology | Admitting: Radiation Oncology

## 2022-09-23 DIAGNOSIS — C099 Malignant neoplasm of tonsil, unspecified: Secondary | ICD-10-CM

## 2022-09-24 NOTE — Progress Notes (Signed)
Oncology Nurse Navigator Documentation   Placed introductory call to new referral patient Glen Farquharson Sr.  Introduced myself as the H&N oncology nurse navigator that works with Dr. Basilio Cairo and medical oncology to whom he has been referred by Dr. Marlou Porch. He confirmed understanding of referral. Briefly explained my role as his navigator, provided my contact information.  I explained that we are working on a PET scan to be completed. When that is scheduled I will notify him and work on getting consults scheduled with radiation and medical oncology.  I explained the purpose of a dental evaluation prior to starting RT, indicated he may be contacted by Dental to arrange an appt.   I encouraged him to call with questions/concerns as he moves forward with appts and procedures.   He verbalized understanding of information provided, expressed appreciation for my call.    Hedda Slade RN, BSN, OCN Head & Neck Oncology Nurse Navigator [facility] at [facility] Phone # [phone]  Fax # [phone]

## 2022-09-25 ENCOUNTER — Other Ambulatory Visit (HOSPITAL_COMMUNITY): Payer: Self-pay | Admitting: Otolaryngology

## 2022-09-25 DIAGNOSIS — C099 Malignant neoplasm of tonsil, unspecified: Secondary | ICD-10-CM

## 2022-09-27 ENCOUNTER — Telehealth: Payer: Self-pay | Admitting: Radiation Oncology

## 2022-09-27 NOTE — Telephone Encounter (Signed)
Called patient to schedule a consultation w. Dr. Squire. No answer, LVM for a return call.  

## 2022-10-02 ENCOUNTER — Encounter (HOSPITAL_COMMUNITY)
Admission: RE | Admit: 2022-10-02 | Discharge: 2022-10-02 | Disposition: A | Discharge status: Home / Self Care | Payer: Medicaid Other | Source: Ambulatory Visit | Attending: Otolaryngology | Admitting: Otolaryngology

## 2022-10-02 ENCOUNTER — Encounter (HOSPITAL_COMMUNITY): Payer: Self-pay

## 2022-10-02 DIAGNOSIS — C099 Malignant neoplasm of tonsil, unspecified: Secondary | ICD-10-CM | POA: Insufficient documentation

## 2022-10-03 ENCOUNTER — Telehealth: Payer: Self-pay | Admitting: Internal Medicine

## 2022-10-03 ENCOUNTER — Telehealth: Payer: Self-pay

## 2022-10-03 NOTE — Telephone Encounter (Signed)
RN still unable to reach pt at the number provided, no voicemail available.

## 2022-10-03 NOTE — Telephone Encounter (Signed)
Called patient and patient contacts; unable to leave a message with patient but left a message with patient contact's voicemail

## 2022-10-03 NOTE — Telephone Encounter (Signed)
RN attempted to call pt for pre consult information without success. RN could not reach pt at the number provided.

## 2022-10-04 ENCOUNTER — Ambulatory Visit
Admission: RE | Admit: 2022-10-04 | Discharge: 2022-10-04 | Disposition: A | Discharge status: Home / Self Care | Payer: Medicaid Other | Source: Ambulatory Visit | Attending: Radiation Oncology | Admitting: Radiation Oncology

## 2022-10-04 ENCOUNTER — Telehealth: Payer: Self-pay

## 2022-10-04 ENCOUNTER — Other Ambulatory Visit: Payer: Self-pay

## 2022-10-04 ENCOUNTER — Ambulatory Visit: Admission: RE | Admit: 2022-10-04 | Payer: Medicaid Other | Source: Ambulatory Visit

## 2022-10-04 DIAGNOSIS — C090 Malignant neoplasm of tonsillar fossa: Secondary | ICD-10-CM

## 2022-10-04 NOTE — Telephone Encounter (Addendum)
Rn still unable to reach pt at provided telephone number on chart. Pt has not voicemail set up. This call was an attempt to gain pre-consult information.

## 2022-10-07 ENCOUNTER — Other Ambulatory Visit: Payer: Self-pay | Admitting: *Deleted

## 2022-10-07 ENCOUNTER — Ambulatory Visit (HOSPITAL_COMMUNITY)
Admission: RE | Admit: 2022-10-07 | Discharge: 2022-10-07 | Disposition: A | Discharge status: Home / Self Care | Payer: Medicaid Other | Source: Ambulatory Visit | Attending: Otolaryngology | Admitting: Otolaryngology

## 2022-10-07 ENCOUNTER — Telehealth: Payer: Self-pay | Admitting: Radiation Oncology

## 2022-10-07 ENCOUNTER — Other Ambulatory Visit: Payer: Self-pay | Admitting: Urology

## 2022-10-07 DIAGNOSIS — C099 Malignant neoplasm of tonsil, unspecified: Secondary | ICD-10-CM | POA: Diagnosis not present

## 2022-10-07 DIAGNOSIS — N2889 Other specified disorders of kidney and ureter: Secondary | ICD-10-CM

## 2022-10-07 LAB — GLUCOSE, CAPILLARY: Glucose-Capillary: 126 mg/dL — ABNORMAL HIGH (ref 70–99)

## 2022-10-07 MED ORDER — FLUDEOXYGLUCOSE F - 18 (FDG) INJECTION
9.4000 | Freq: Once | INTRAVENOUS | Status: AC
  Administered 2022-10-07: 9.29 via INTRAVENOUS

## 2022-10-07 NOTE — Telephone Encounter (Signed)
Called patient to r/s consultation w. Dr. Basilio Cairo. Patient was not available, spoke to Lupita Leash who stated she will tell patient to give [place] a call back to schedule.

## 2022-10-08 ENCOUNTER — Encounter: Payer: Self-pay | Admitting: Internal Medicine

## 2022-10-08 ENCOUNTER — Inpatient Hospital Stay: Payer: Medicaid Other

## 2022-10-08 ENCOUNTER — Telehealth: Payer: Self-pay | Admitting: Radiation Oncology

## 2022-10-08 ENCOUNTER — Inpatient Hospital Stay: Payer: Medicaid Other | Attending: Internal Medicine | Admitting: Internal Medicine

## 2022-10-08 VITALS — BP 97/75 | HR 88 | Temp 98.2°F | Resp 20 | Ht 68.0 in | Wt 197.5 lb

## 2022-10-08 DIAGNOSIS — C109 Malignant neoplasm of oropharynx, unspecified: Secondary | ICD-10-CM | POA: Diagnosis present

## 2022-10-08 DIAGNOSIS — Z8051 Family history of malignant neoplasm of kidney: Secondary | ICD-10-CM | POA: Insufficient documentation

## 2022-10-08 DIAGNOSIS — Z803 Family history of malignant neoplasm of breast: Secondary | ICD-10-CM | POA: Diagnosis not present

## 2022-10-08 DIAGNOSIS — C099 Malignant neoplasm of tonsil, unspecified: Secondary | ICD-10-CM | POA: Diagnosis not present

## 2022-10-08 DIAGNOSIS — F17210 Nicotine dependence, cigarettes, uncomplicated: Secondary | ICD-10-CM | POA: Insufficient documentation

## 2022-10-08 DIAGNOSIS — H548 Legal blindness, as defined in USA: Secondary | ICD-10-CM | POA: Diagnosis not present

## 2022-10-08 DIAGNOSIS — F419 Anxiety disorder, unspecified: Secondary | ICD-10-CM | POA: Insufficient documentation

## 2022-10-08 DIAGNOSIS — R918 Other nonspecific abnormal finding of lung field: Secondary | ICD-10-CM | POA: Diagnosis not present

## 2022-10-08 DIAGNOSIS — C770 Secondary and unspecified malignant neoplasm of lymph nodes of head, face and neck: Secondary | ICD-10-CM | POA: Insufficient documentation

## 2022-10-08 DIAGNOSIS — N2889 Other specified disorders of kidney and ureter: Secondary | ICD-10-CM

## 2022-10-08 DIAGNOSIS — Z8554 Personal history of malignant neoplasm of ureter: Secondary | ICD-10-CM | POA: Diagnosis not present

## 2022-10-08 DIAGNOSIS — Z970 Presence of artificial eye: Secondary | ICD-10-CM | POA: Diagnosis not present

## 2022-10-08 LAB — CBC WITH DIFFERENTIAL (CANCER CENTER ONLY)
Abs Immature Granulocytes: 0.01 10*3/uL (ref 0.00–0.07)
Basophils Absolute: 0.1 10*3/uL (ref 0.0–0.1)
Basophils Relative: 1 %
Eosinophils Absolute: 0.2 10*3/uL (ref 0.0–0.5)
Eosinophils Relative: 2 %
HCT: 42.2 % (ref 39.0–52.0)
Hemoglobin: 14.8 g/dL (ref 13.0–17.0)
Immature Granulocytes: 0 %
Lymphocytes Relative: 44 %
Lymphs Abs: 2.7 10*3/uL (ref 0.7–4.0)
MCH: 31.4 pg (ref 26.0–34.0)
MCHC: 35.1 g/dL (ref 30.0–36.0)
MCV: 89.6 fL (ref 80.0–100.0)
Monocytes Absolute: 0.7 10*3/uL (ref 0.1–1.0)
Monocytes Relative: 11 %
Neutro Abs: 2.6 10*3/uL (ref 1.7–7.7)
Neutrophils Relative %: 42 %
Platelet Count: 208 10*3/uL (ref 150–400)
RBC: 4.71 MIL/uL (ref 4.22–5.81)
RDW: 13.7 % (ref 11.5–15.5)
WBC Count: 6.2 10*3/uL (ref 4.0–10.5)
nRBC: 0 % (ref 0.0–0.2)

## 2022-10-08 LAB — CMP (CANCER CENTER ONLY)
ALT: 17 U/L (ref 0–44)
AST: 14 U/L — ABNORMAL LOW (ref 15–41)
Albumin: 4.1 g/dL (ref 3.5–5.0)
Alkaline Phosphatase: 77 U/L (ref 38–126)
Anion gap: 4 — ABNORMAL LOW (ref 5–15)
BUN: 12 mg/dL (ref 6–20)
CO2: 31 mmol/L (ref 22–32)
Calcium: 8.9 mg/dL (ref 8.9–10.3)
Chloride: 105 mmol/L (ref 98–111)
Creatinine: 0.87 mg/dL (ref 0.61–1.24)
GFR, Estimated: >60 mL/min (ref >60)
Glucose, Bld: 126 mg/dL — ABNORMAL HIGH (ref 70–99)
Potassium: 3.8 mmol/L (ref 3.5–5.1)
Sodium: 140 mmol/L (ref 135–145)
Total Bilirubin: 0.4 mg/dL (ref 0.3–1.2)
Total Protein: 7.3 g/dL (ref 6.5–8.1)

## 2022-10-08 NOTE — Progress Notes (Signed)
[facility] Telephone:([phone]   Fax:([phone]) [phone]  CONSULT NOTE  REFERRING PHYSICIAN: Dr. Lonie [facility]  REASON FOR CONSULTATION:  57 years old white male recently diagnosed with oropharyngeal cancer  HPI Glen [facility]. is a 57 y.o. male with past medical history significant for chronic back pain with sciatica, history of kidney stone, right ureteral cancer followed by Dr. Marlou Mcmillan and the patient is legally blind.  He also has a long history of smoking.  The patient mention that he was diagnosed with ureteral cancer in November 2023 with a biopsy performed by Dr. 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  In March 2024 the patient was incarcerated and he felt a lump in the right side of the neck.  He was seen by Dr. Ishmael [facility]millan, ENT at [facility] and he had CT of the neck performed on 07/17/2022 and that showed a masslike enlargement of the right palatine tonsil with extension into the right glossotonsillar sulcus and onto the soft palate concerning for primary oropharyngeal malignancy with enlarged left palatine tonsil which warrants correlation with direct inspection and no findings specific for cervical nodal metastasis.  He also had CT scan of the chest, abdomen and pelvis without contrast on the same day and that showed no specific evidence of metastatic disease in the chest, abdomen or pelvis within the limitation of the noncontrast scan.  There was multiple peripheral solid pulmonary micronodules nonspecific and mild right hydronephrosis with appropriate position of the ureteral stent.  There was associated thickening and stranding of the renal pelvis and the proximal to mid ureter.  The patient had laryngoscopic flexible diagnostic exam by Dr. Chestine Mcmillan as well as biopsy of the right oropharyngeal mass.  The final pathology [id number]) from  [facility] showed none keratinizing squamous cell carcinoma, HPV associated.  Dr. Chestine Mcmillan does not think the patient will be a good surgical candidate and she referred him to [facility] for consideration of a course of concurrent chemoradiation.  The patient had a PET scan performed yesterday but the final report is still pending When seen today the patient is feeling fine except for anxiety.  The swelling in the right neck area has significantly decreased compared to before.  He denied having any current chest pain, shortness of breath, cough or hemoptysis.  He has no nausea, vomiting, diarrhea or constipation.  He is legally blind and has visual changes but no significant weight loss or night sweats. Family history significant for mother with breast cancer and congestive heart failure.  Father had diabetes and congestive heart failure.  He had a half brother with kidney cancer. The patient is single and has 1 son who lives in [place] but has no contact with the patient for several years.  He is dependent mainly on 2 of his discharge friend Glen Mcmillan and Glen Mcmillan.  He has a history of smoking up to 2 pack/day for 38 years and he is trying to quit.  He has no history of alcohol or drug abuse. HPI  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   discomfort with swallowing, voice changes   Legally blind    both eyes   Ureter cancer, right [facility]) 12/2021   urologist--- dr Glen [facility];  dx 12-12-2021  s/p  ureteral bx ,  noninvasive low grade papillary urothelial cnacer    Past Surgical History:  Procedure Laterality Date   APPENDECTOMY     CYSTOSCOPY W/ URETERAL STENT PLACEMENT Bilateral 12/12/2021   Procedure: CYSTOSCOPY WITH BILATERAL RETROGRADE PYELOGRAM/URETERAL DILATION/URETEROSCOPY/URETERAL STENT PLACEMENT/RIGHT DIAGNOSTIC URETEROSCOPY, URETERAL BIOPSY;LEFT  LASER LITHOTRISPY/STONE EXTRACTION;  Surgeon: Glen Fat, MD;  Location: [facility];  Service: Urology;  Laterality: Bilateral;   CYSTOSCOPY WITH STENT PLACEMENT Bilateral 01/09/2022   Procedure: LEFT STENT REMOVAL, RIGHT STENT EXCHANGE;  Surgeon: Glen Fat, MD;  Location: [facility];  Service: Urology;  Laterality: Bilateral;   CYSTOSCOPY WITH URETEROSCOPY AND STENT PLACEMENT Right 08/28/2022   Procedure: CYSTOSCOPY WITH  RIGHT URETEROSCOPY AND RIGHT URETERAL  STENT EXCHANGE;  Surgeon: Glen Fat, MD;  Location: [facility];  Service: Urology;  Laterality: Right;  60 MINUTES   EYE SURGERY     MASS EXCISION Right 01/09/2022   Procedure: RIGHT URETERAL MASS EXCISION;  Surgeon: Glen Fat, MD;  Location[facility];  Service: Urology;  Laterality: Right;   URETERAL BIOPSY Right 08/28/2022   Procedure: ABLATION AND BIOPSY OF URETERAL MASS/TUMOR;  Surgeon: Glen Fat, MD;  Location: [facility];  Service: Urology;  Laterality: Right;    Family History  Problem Relation Age of Onset   Cancer Mother        breast   Heart failure Mother        pacemaker   Diabetes Father        deceased   Heart failure Father     Social History Social History   Tobacco Use   Smoking status: Every Day    Current packs/day: 0.50    Types: Cigarettes   Smokeless tobacco: Never  Vaping Use   Vaping status: Some Days  Substance Use Topics   Alcohol use: No   Drug use: No    Allergies  Allergen Reactions   Other     States he can't have MRI due to metal shrapnel in his body and in his eye   Sulfa Antibiotics Itching    Gi intolerance    Current Outpatient Medications  Medication Sig Dispense Refill   hydrOXYzine (VISTARIL) 25 MG capsule Take 1 capsule (25 mg total) by mouth 3 (three) times daily as needed for anxiety. (Patient not taking: Reported on 08/07/2022) 30 capsule 0   latanoprost (XALATAN) 0.005 % ophthalmic solution Place 1 drop  into the left eye at bedtime.     naproxen (NAPROSYN) 375 MG tablet Take 1 tablet (375 mg total) by mouth 2 (two) times daily. (Patient not taking: Reported on 08/07/2022) 10 tablet 0   phenazopyridine (PYRIDIUM) 100 MG tablet Take 2 tablets (200 mg total) by mouth 3 (three) times daily as needed for pain. (Patient not taking: Reported on 08/07/2022) 20 tablet 0   Polyvinyl Alcohol (LIQUID TEARS OP) Place 3 drops into the right eye daily as needed (dryness).     timolol (TIMOPTIC) 0.25 % ophthalmic solution Place 1 drop into the left eye daily.     traMADol (ULTRAM) 50 MG tablet Take 1-2 tablets (50-100 mg total) by mouth every 6 (six) hours as needed for moderate pain. (Patient not taking: Reported on 08/07/2022) 15 tablet 0   No current facility-administered medications for this visit.    Review of Systems  Constitutional: positive for fatigue Eyes: positive for visual disturbance Ears, nose, mouth, throat, and face: positive for sore mouth Respiratory:  negative Cardiovascular: negative Gastrointestinal: negative Genitourinary:negative Integument/breast: negative Hematologic/lymphatic: negative Musculoskeletal:negative Neurological: negative Behavioral/Psych: positive for anxiety Endocrine: negative Allergic/Immunologic: negative  Physical Exam  QMV:HQION, healthy, no distress, well nourished, well developed, and anxious SKIN: skin color, texture, turgor are normal, no rashes or significant lesions HEAD: Normocephalic, No masses, lesions, tenderness or abnormalities EYES: normal, PERRLA, Conjunctiva are pink and non-injected EARS: External ears normal, Canals clear OROPHARYNX:no exudate, no erythema, and necrotic mass in the right oropharyngeal area NECK: supple, no adenopathy, no JVD LYMPH:  no palpable lymphadenopathy, no hepatosplenomegaly LUNGS: clear to auscultation , and palpation HEART: regular rate & rhythm, no murmurs, and no gallops ABDOMEN:abdomen soft, non-tender,  normal bowel sounds, and no masses or organomegaly BACK: Back symmetric, no curvature., No CVA tenderness EXTREMITIES:no joint deformities, effusion, or inflammation, no edema  NEURO: alert & oriented x 3 with fluent speech, no focal motor/sensory deficits    PERFORMANCE STATUS: ECOG 1  LABORATORY DATA: Lab Results  Component Value Date   WBC 6.2 10/08/2022   HGB 14.8 10/08/2022   HCT 42.2 10/08/2022   MCV 89.6 10/08/2022   PLT 208 10/08/2022      Chemistry      Component Value Date/Time   NA 140 10/08/2022 1341   K 3.8 10/08/2022 1341   CL 105 10/08/2022 1341   CO2 31 10/08/2022 1341   BUN 12 10/08/2022 1341   CREATININE 0.87 10/08/2022 1341      Component Value Date/Time   CALCIUM 8.9 10/08/2022 1341   ALKPHOS 77 10/08/2022 1341   AST 14 (L) 10/08/2022 1341   ALT 17 10/08/2022 1341   BILITOT 0.4 10/08/2022 1341       RADIOGRAPHIC STUDIES: No results found.  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  The patient was not a good surgical candidate for resection according to his ENT physician at [facility].   PLAN: I had a lengthy discussion with the patient today about his current disease stage, prognosis and treatment options. I explained to the patient that if he is not a good surgical candidate according to the ENT physician, then has next best treatment option would be a course of concurrent chemoradiation with weekly cisplatin 40 Mg/M2 concurrent with radiation for 7 weeks. I discussed with the patient the adverse effect of this treatment including but not limited to alopecia, myelosuppression, nausea and vomiting, peripheral neuropathy, liver or renal dysfunction. I will wait for the PET scan report to exclude any other metastatic disease before starting this treatment. I will refer the  patient to the palliative care team for management of his pain issue especially during treatment and also for the anxiety. The patient may need a PEG tube placement for nutrition during his treatment. The patient was seen by the head and neck navigator today and she will coordinate his care.  He is expected to see Dr. Basilio Cairo at the end of the month and once he is ready for the course of concurrent chemoradiation, I will enter the chemotherapy plan and arrange for the patient to have a chemotherapy education class before starting the treatment. I will arrange his follow-up visit and treatment based on his visit with Dr. Basilio Cairo. The patient was advised to call immediately if he has any other concerning symptoms in the interval. The patient voices understanding of current disease status and treatment  options and is in agreement with the current care plan.  All questions were answered. The patient knows to call the clinic with any problems, questions or concerns. We can certainly see the patient much sooner if necessary.  Thank you so much for allowing me to participate in the care of [name] Sr.. I will continue to follow up the patient with you and assist in his care.  The total time spent in the appointment was 90 minutes.  Disclaimer: This note was dictated with voice recognition software. Similar sounding words can inadvertently be transcribed and may not be corrected upon review.   Lajuana Matte October 08, 2022, 2:33 PM

## 2022-10-08 NOTE — Telephone Encounter (Signed)
Called patient to r/s his consultation w. Dr. Basilio Cairo. No answer, LVM for a return call.

## 2022-10-09 ENCOUNTER — Inpatient Hospital Stay: Payer: Medicaid Other | Admitting: Licensed Clinical Social Worker

## 2022-10-09 ENCOUNTER — Other Ambulatory Visit: Payer: Self-pay

## 2022-10-09 ENCOUNTER — Other Ambulatory Visit (HOSPITAL_COMMUNITY): Payer: Medicaid Other

## 2022-10-09 DIAGNOSIS — C109 Malignant neoplasm of oropharynx, unspecified: Secondary | ICD-10-CM

## 2022-10-09 NOTE — Progress Notes (Signed)
[facility] Clinical Social Work  Initial Assessment   LESEAN KENIMER Sr. is a 57 y.o. year old male contacted by phone. Clinical Social Work was referred by medical provider for assessment of psychosocial needs.   SDOH (Social Determinants of Health) assessments performed: Yes   SDOH Screenings   Food Insecurity: No Food Insecurity (12/09/2021)  Housing: Low Risk  (12/09/2021)  Transportation Needs: No Transportation Needs (12/09/2021)  Utilities: Not At Risk (12/09/2021)  Tobacco Use: High Risk (10/08/2022)     Distress Screen completed: No     No data to display            Family/Social Information:  Housing Arrangement: patient lives with friends, Lupita Leash and Chanetta Marshall who pt has known for 40 years through church.  Family members/support persons in your life? Pt has very limited support.  He has a sister in [place], but not local and a son in [place] whom he has not spoken to in a number of years.  Pt was released from incarceration in May.   Transportation concerns: yes  Employment: Unemployed Pt states he has not worked for the past ten years following an accident which rendered him blind in one eye.  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   Income source: No income Financial concerns: Yes, current concerns Type of concern: Rent/ mortgage and Food Food access concerns: yes Religious or spiritual practice: Not known Services Currently in place:  pt has had Medicaid for about 1 1/2 months and receives food stamps  Coping/ Adjustment to diagnosis: Patient understands treatment plan and what happens next? yes Concerns about diagnosis and/or treatment: Overwhelmed by information, How I will pay for the services I need, How will I care for myself, and Quality of life Patient reported stressors: Finances and Adjusting to my illness Hopes and/or priorities: Pt's priority is to start treatment w/ the hope of positive results Patient enjoys  not discussed Current  coping skills/ strengths: Capable of independent living  and Motivation for treatment/growth     SUMMARY: Current SDOH Barriers:  Financial constraints related to no income  Clinical Social Work Clinical Goal(s):  Scientist, research (life sciences) options for unmet needs related to:  Financial Strain   Interventions: Discussed common feeling and emotions when being diagnosed with cancer, and the importance of support during treatment Informed patient of the support team roles and support services at [facility] Provided CSW contact information and encouraged patient to call with any questions or concerns Encouraged pt to go to DSS to apply for SSI as well as check into applying for section 8 housing as pt would like to eventually have his own place.  Pt is not expected to start treatment until September.  CSW explained [employer] and informed pt he will not be eligible to apply until treatment begins.  Pt also informed of the food pantry at the cancer center and transportation should his Medicaid transportation benefits not yet be activated.  Pt to contact CSW once treatment has been scheduled.   Follow Up Plan: Patient will contact CSW with any support or resource needs Patient verbalizes understanding of plan: Yes    Rachel Moulds, LCSW Clinical Social Worker [facility]  Patient is participating in a Managed Medicaid Plan:  Yes

## 2022-10-09 NOTE — Progress Notes (Signed)
Oncology Nurse Navigator Documentation   Met with patient during initial consult with Dr. Arbutus Ped.  Further introduced myself as his Navigator, explained my role as a member of the Care Team. Assisted with post-consult appt scheduling. The radiation oncology schedulers had been trying to contact him for a rescheduled appointment with Dr. Basilio Cairo. I offered him 8/21 at 9:30 and he accepted. He verbalized understanding of information provided. I encouraged him to call with questions/concerns moving forward.  Hedda Slade, RN, BSN, OCN Head & Neck Oncology Nurse Navigator [facility] at [place] [phone]

## 2022-10-15 ENCOUNTER — Encounter: Payer: Self-pay | Admitting: Radiation Oncology

## 2022-10-15 ENCOUNTER — Telehealth: Payer: Self-pay

## 2022-10-15 NOTE — Telephone Encounter (Signed)
Rn called pt for meaningful use and nurse evaluation information. Consult note completed and routed to Dr. Basilio Cairo for review.

## 2022-10-15 NOTE — Progress Notes (Signed)
Pt will need ride assistance for radiation treatments.
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
## 2022-10-15 NOTE — Telephone Encounter (Signed)
Rn attempted to call pt for meaningful use and nurse evaluation information without success. Pt voicemail was full and RN could not leave message.

## 2022-10-16 ENCOUNTER — Other Ambulatory Visit: Payer: Self-pay

## 2022-10-16 ENCOUNTER — Ambulatory Visit
Admission: RE | Admit: 2022-10-16 | Discharge: 2022-10-16 | Disposition: A | Discharge status: Home / Self Care | Payer: Medicaid Other | Source: Ambulatory Visit | Attending: Radiation Oncology | Admitting: Radiation Oncology

## 2022-10-16 ENCOUNTER — Other Ambulatory Visit: Payer: Self-pay | Admitting: Internal Medicine

## 2022-10-16 ENCOUNTER — Other Ambulatory Visit: Payer: Self-pay | Admitting: Physician Assistant

## 2022-10-16 ENCOUNTER — Inpatient Hospital Stay: Payer: Medicaid Other | Admitting: Licensed Clinical Social Worker

## 2022-10-16 ENCOUNTER — Encounter: Payer: Self-pay | Admitting: Internal Medicine

## 2022-10-16 VITALS — BP 114/83 | HR 100 | Temp 98.0°F | Resp 20 | Ht 68.0 in | Wt 194.6 lb

## 2022-10-16 DIAGNOSIS — C109 Malignant neoplasm of oropharynx, unspecified: Secondary | ICD-10-CM

## 2022-10-16 DIAGNOSIS — G8929 Other chronic pain: Secondary | ICD-10-CM | POA: Diagnosis not present

## 2022-10-16 DIAGNOSIS — Z87442 Personal history of urinary calculi: Secondary | ICD-10-CM | POA: Insufficient documentation

## 2022-10-16 DIAGNOSIS — C090 Malignant neoplasm of tonsillar fossa: Secondary | ICD-10-CM | POA: Insufficient documentation

## 2022-10-16 DIAGNOSIS — Z79899 Other long term (current) drug therapy: Secondary | ICD-10-CM | POA: Diagnosis not present

## 2022-10-16 DIAGNOSIS — I2510 Atherosclerotic heart disease of native coronary artery without angina pectoris: Secondary | ICD-10-CM | POA: Diagnosis not present

## 2022-10-16 DIAGNOSIS — I700 Atherosclerosis of aorta: Secondary | ICD-10-CM | POA: Insufficient documentation

## 2022-10-16 DIAGNOSIS — F17210 Nicotine dependence, cigarettes, uncomplicated: Secondary | ICD-10-CM | POA: Insufficient documentation

## 2022-10-16 DIAGNOSIS — N281 Cyst of kidney, acquired: Secondary | ICD-10-CM | POA: Diagnosis not present

## 2022-10-16 MED ORDER — SODIUM CHLORIDE 0.9% FLUSH
10.0000 mL | Freq: Once | INTRAVENOUS | Status: AC
  Administered 2022-10-16: 10 mL via INTRAVENOUS

## 2022-10-16 MED ORDER — NICOTINE 21 MG/24HR TD PT24
21.0000 mg | MEDICATED_PATCH | Freq: Every day | TRANSDERMAL | 2 refills | Status: DC
Start: 2022-10-16 — End: 2023-01-09

## 2022-10-16 MED ORDER — PROCHLORPERAZINE MALEATE 10 MG PO TABS
10.0000 mg | ORAL_TABLET | Freq: Four times a day (QID) | ORAL | 1 refills | Status: DC | PRN
Start: 2022-10-16 — End: 2022-11-25

## 2022-10-16 MED ORDER — NICOTINE 14 MG/24HR TD PT24: 14.0000 mg | MEDICATED_PATCH | Freq: Every day | TRANSDERMAL | 0 refills | Status: DC

## 2022-10-16 MED ORDER — NICOTINE 7 MG/24HR TD PT24
7.0000 mg | MEDICATED_PATCH | Freq: Every day | TRANSDERMAL | 0 refills | Status: DC
Start: 2022-10-16 — End: 2023-01-09

## 2022-10-16 MED ORDER — LIDOCAINE-PRILOCAINE 2.5-2.5 % EX CREA
TOPICAL_CREAM | CUTANEOUS | 3 refills | Status: DC
Start: 2022-10-16 — End: 2023-01-09

## 2022-10-16 NOTE — Progress Notes (Signed)
Has armband been applied?  Yes.    Does patient have an allergy to IV contrast dye?: No.   Has patient ever received premedication for IV contrast dye?: No.   Does patient take metformin?: No.  If patient does take metformin when was the last dose: N/A  Date of lab work: October 08, 2022 BUN: 12 CR: 0.87 eGfr: >60  IV site: antecubital left, condition patent and no redness  Has IV site been added to flowsheet?  Yes.    There were no vitals taken for this visit.  This concludes the interaction.  Ruel Favors, LPN

## 2022-10-16 NOTE — Progress Notes (Signed)
[facility] CSW Progress Note  Visual merchandiser met with patient to discuss concerns regarding pt's living situation.  Pt states he continues to stay with his friend Lupita Leash and Chanetta Marshall whom he knows from church.  Pt does not want to continue to stay w/ his friends as he states they have a number of cats and dogs and the house is full of fleas.  Pt is concerned it is not a clean enough environment.  CSW inquired if pt had contacted DSS as previously discussed to apply for SSI.  Pt informed CSW he contacted a lawyer who will be assisting pt w/ applying for SSI.  Pt provided w/ the number for the outpost station for DSS to inquire if they may be able to facilitate the process more efficiently.  Pt will need an income before he can realistically try to leave his current situation.  CSW discussed different living arrangements pt could consider once SSI has been approved at length w/ pt which includes renting a room if needed.  Pt does not wish to go to a shelter.  Pt did inquire if he moved out of where he is now and bought a tent to make himself officially homeless if he may get housing faster.  CSW discouraged pt from this option as he does at least have a stable roof over his head.  It was reiterated multiple times to pt that the first step will be completing the application for SSI.  Pt was provided w/ a bag of groceries and toiletries while in radiation.  Pt also reminded of [employer] which he will be eligible to apply for once treatment starts.  CSW to continue to follow as appropriate.      Rachel Moulds, LCSW Clinical Social Worker [facility]    Patient is participating in a Managed Medicaid Plan:  Yes

## 2022-10-16 NOTE — Progress Notes (Signed)
START ON PATHWAY REGIMEN - Head and Neck     A cycle is every 7 days:     Cisplatin   **Always confirm dose/schedule in your pharmacy ordering system**  Patient Characteristics: Oropharynx, HPV Positive, Preoperative or Nonsurgical Candidate (Clinical Staging), cT0-4, cN1-3 or cT3-4, cN0 Disease Classification: Oropharynx HPV Status: Positive (+) Therapeutic Status: Preoperative or Nonsurgical Candidate (Clinical Staging) AJCC T Category: cT3 AJCC 8 Stage Grouping: II AJCC N Category: cN0 AJCC M Category: cM0 Intent of Therapy: Curative Intent, Discussed with Patient

## 2022-10-17 ENCOUNTER — Other Ambulatory Visit: Payer: Self-pay

## 2022-10-17 DIAGNOSIS — C109 Malignant neoplasm of oropharynx, unspecified: Secondary | ICD-10-CM

## 2022-10-18 ENCOUNTER — Telehealth: Payer: Self-pay

## 2022-10-18 ENCOUNTER — Other Ambulatory Visit: Payer: Self-pay | Admitting: Radiation Oncology

## 2022-10-18 DIAGNOSIS — C090 Malignant neoplasm of tonsillar fossa: Secondary | ICD-10-CM

## 2022-10-18 MED ORDER — LIDOCAINE VISCOUS HCL 2 % MT SOLN
OROMUCOSAL | 3 refills | Status: DC
Start: 2022-10-18 — End: 2023-01-09

## 2022-10-18 NOTE — Telephone Encounter (Signed)
Rn called pt back after he had left a voicemail for nursing staff. Pt reported that medication was supposed to be called in for his throat pain but was never called him. He reports his throat remains painful and is now white in color. Rn will reach out to Dr. Basilio Cairo for guidance and feedback. Pt reports the [facility] in [place] is best pharmacy for him. Rn will call pt back with Dr. Colletta Maryland feedback.

## 2022-10-18 NOTE — Telephone Encounter (Signed)
RN called pt to let him know that Dr. Basilio Cairo called in Lidocaine solution to pt pharmacy. Rn went over specific instructions with pt as he is legally blind and will need help with this process. Pt stated he had someone that could help him with this.  He also mentioned on the phone that he needed help getting ensures as well. RN will reach out to social work and nutrition to see what options are available for pt. Finally pt stated he had a letter for social work regarding the [employer] that he will bring in. He wants to help support the friends he is staying with utility bills with these funds. He feels very grateful and blessed by their support. RN will continue to assist pt as needed.

## 2022-10-21 ENCOUNTER — Telehealth: Payer: Self-pay | Admitting: Medical Oncology

## 2022-10-21 NOTE — Telephone Encounter (Signed)
Pt did not want to do a porta cath .until next visits with Cassie and after Dr Basilio Cairo talks to [facility] about chemotherapy.Marland Kitchen He is homeless and legally blind.Living with someone in [place]. I  told pt I will call transportation for his appts next week.

## 2022-10-22 ENCOUNTER — Other Ambulatory Visit: Payer: Self-pay

## 2022-10-22 ENCOUNTER — Telehealth: Payer: Self-pay

## 2022-10-22 ENCOUNTER — Telehealth: Payer: Self-pay | Admitting: Medical Oncology

## 2022-10-22 NOTE — Telephone Encounter (Signed)
Rn called pt on two occasions this am. First phone call was to inform him we have moved up his palliative care appointment to this Friday at 11am.  The second call was to discuss ensures for the patient. Rn called [facility] in [place] and the pharmacist informed Rn that insurance does not cover ensures. She stated she is not sure who would have told the patient they would be. Rn educated pt on this and reminded him he would meet with nutrition next week and we could get him some samples (and coupons) for ensure. He was grateful for the phone call and the assistance.

## 2022-10-22 NOTE — Telephone Encounter (Signed)
Pt called and left message for nursing staff to call him back. He stated when RN called him that the liquid lidocaine was not helping his throat pain, and he needed more for pain. He also stated he felt like the mass was pushing more on his throat than it was before.  Pt did report to RN that he was able to purchase a box of ensures, but would like a prescription for them (so insurance will pay for them). He told RN he is drinking about 3-4 a day due to not being able to eat properly. He did finally also report to RN that the nicotine patches were helping him and he was very grateful for them. The [facility] in [place] continues to be the best pharmacy for him. Rn will reach out to Dr. Basilio Cairo for feedback and call pt back.

## 2022-10-22 NOTE — Telephone Encounter (Signed)
Dr Basilio Cairo will manage pain until pt can be seen by Palliative.

## 2022-10-23 ENCOUNTER — Ambulatory Visit: Payer: Medicaid Other | Admitting: Radiation Oncology

## 2022-10-23 ENCOUNTER — Ambulatory Visit (HOSPITAL_COMMUNITY): Payer: Medicaid Other

## 2022-10-23 ENCOUNTER — Other Ambulatory Visit (HOSPITAL_COMMUNITY): Payer: Medicaid Other

## 2022-10-23 ENCOUNTER — Ambulatory Visit: Payer: Medicaid Other

## 2022-10-23 DIAGNOSIS — C090 Malignant neoplasm of tonsillar fossa: Secondary | ICD-10-CM | POA: Diagnosis not present

## 2022-10-24 NOTE — Progress Notes (Deleted)
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 There is a right tonsillar mass, extension of the mass to the right base of tongue and soft palate, involvement of the contralateral palatine tonsil, and the small bilateral cervical notal metastases. The patient was not a good surgical candidate for resection according to his ENT physician at [facility].  2) renal cancer Fall 2023 and had a resection with stents placed   PRIOR THERAPY: None   CURRENT THERAPY: concurrent chemoradiation with cisplatin 40 mg/m2 weekly. First dose expected on 10/28/22  INTERVAL HISTORY: Glen Anis Sr. 57 y.o. male returns to clinic today for follow-up visit accompanied by ***.  Unfortunately, the patient has a complex situation.  He was incarcerated when he first noticed a mass of the right palatine tonsil with extension into the glossotonsillar sulcus and soft palate concerning for oropharyngeal malignancy.   He is currently living with his friends, however, it is an unclean living environment. He has been in touch with social work for Ecologist, Geographical information systems officer, and recommendations for applying for DSS for SSI.  He is also scheduled to meet with nutrition on 10/29/2022 and was given information about the food pantry and supplemental drinks.  The patient declined PEG tube placement.  He also declined Port-A-Cath placement.  He is also seeing palliative care due to his throat pain.  He previously tried lidocaine solution without any improvement. Palliative care is planning on ***.  Radiation oncology also has referred him to physical therapy for risk of lymphedema in the neck and deconditioning, and speech and language pathology for swallowing and or speech therapy.   Regarding  his treatment, the patient had a chemo education class and does not have any questions.  Overall the patient is feeling ***today.  Fatigue and weakness?  How ambulatory at home?  Denies any fever, chills, or night sweats.  Weight loss?  Denies any chest pain, shortness of breath, cough, or hemoptysis.  Denies any nausea, vomiting, diarrhea, or constipation.   Note sent to Dr Marlou Porch to see if early October cystoscopy procedure can be delayed to allow recovery from H+N tx's, first.   The patient is here for evaluation and repeat blood work before considering starting cycle #1.    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  MEDICATIONS:  Current Outpatient Medications  Medication Sig Dispense Refill   latanoprost (XALATAN) 0.005 % ophthalmic solution Place 1 drop into the left eye at bedtime.     lidocaine (XYLOCAINE) 2 % solution Patient: Mix 1part 2% viscous lidocaine, 1part H20. Swallow 10mL of diluted mixture, before meals and at bedtime, up to QID 200 mL 3   lidocaine-prilocaine (EMLA) cream Apply to affected area once 30 g 3   nicotine (NICODERM CQ - DOSED IN MG/24 HOURS) 14 mg/24hr patch Place 1  patch (14 mg total) onto the skin daily. Apply 21 mg patch daily x 6 wk, then 14mg  patch daily x 2 wk, then 7 mg patch daily x 2 wk 14 patch 0   nicotine (NICODERM CQ - DOSED IN MG/24 HOURS) 21 mg/24hr patch Place 1 patch (21 mg total) onto the skin daily. Apply 21 mg patch daily x 6 wk, then 14mg  patch daily x 2 wk, then 7 mg patch daily x 2 wk 14 patch 2   nicotine (NICODERM CQ -  DOSED IN MG/24 HR) 7 mg/24hr patch Place 1 patch (7 mg total) onto the skin daily. Apply 21 mg patch daily x 6 wk, then 14mg  patch daily x 2 wk, then 7 mg patch daily x 2 wk 14 patch 0   phenazopyridine (PYRIDIUM) 100 MG tablet Take 2 tablets (200 mg total) by mouth 3 (three) times daily as needed for pain. (Patient not taking: Reported on 08/07/2022) 20 tablet 0   Polyvinyl Alcohol (LIQUID TEARS OP) Place 3 drops into the right eye daily as needed (dryness).     prochlorperazine (COMPAZINE) 10 MG tablet Take 1 tablet (10 mg total) by mouth every 6 (six) hours as needed (Nausea or vomiting). 30 tablet 1   timolol (TIMOPTIC) 0.25 % ophthalmic solution Place 1 drop into the left eye daily.     No current facility-administered medications for this visit.    SURGICAL HISTORY:  Past Surgical History:  Procedure Laterality Date   APPENDECTOMY     CYSTOSCOPY W/ URETERAL STENT PLACEMENT Bilateral 12/12/2021   Procedure: CYSTOSCOPY WITH BILATERAL RETROGRADE PYELOGRAM/URETERAL DILATION/URETEROSCOPY/URETERAL STENT PLACEMENT/RIGHT DIAGNOSTIC URETEROSCOPY, URETERAL BIOPSY;LEFT LASER LITHOTRISPY/STONE EXTRACTION;  Surgeon: [name], MD;  Location: [facility];  Service: Urology;  Laterality: Bilateral;   CYSTOSCOPY WITH STENT PLACEMENT Bilateral 01/09/2022   Procedure: LEFT STENT REMOVAL, RIGHT STENT EXCHANGE;  Surgeon: [name], MD;  Location: [facility];  Service: Urology;  Laterality: Bilateral;   CYSTOSCOPY WITH URETEROSCOPY AND STENT PLACEMENT Right 08/28/2022   Procedure: CYSTOSCOPY WITH  RIGHT URETEROSCOPY AND RIGHT URETERAL  STENT EXCHANGE;  Surgeon: [name], MD;  Location: [facility];  Service: Urology;  Laterality: Right;  60 MINUTES   EYE SURGERY     MASS EXCISION Right 01/09/2022   Procedure: RIGHT URETERAL MASS EXCISION;  Surgeon: [name], MD;  Location: [facility];  Service: Urology;  Laterality: Right;   URETERAL BIOPSY Right  08/28/2022   Procedure: ABLATION AND BIOPSY OF URETERAL MASS/TUMOR;  Surgeon: [name], MD;  Location: [facility];  Service: Urology;  Laterality: Right;    REVIEW OF SYSTEMS:   Review of Systems  Constitutional: Negative for appetite change, chills, fatigue, fever and unexpected weight change.  HENT:   Negative for mouth sores, nosebleeds, sore throat and trouble swallowing.   Eyes: Negative for eye problems and icterus.  Respiratory: Negative for cough, hemoptysis, shortness of breath and wheezing.   Cardiovascular: Negative for chest pain and leg swelling.  Gastrointestinal: Negative for abdominal pain, constipation, diarrhea, nausea and vomiting.  Genitourinary: Negative for bladder incontinence, difficulty urinating, dysuria, frequency and hematuria.   Musculoskeletal: Negative for back pain, gait problem, neck pain and neck stiffness.  Skin: Negative for itching and rash.  Neurological: Negative for dizziness, extremity weakness, gait problem, headaches, light-headedness and seizures.  Hematological: Negative for adenopathy. Does not bruise/bleed easily.  Psychiatric/Behavioral: Negative for confusion, depression and sleep disturbance. The patient is not nervous/anxious.     PHYSICAL  EXAMINATION:  There were no vitals taken for this visit.  ECOG PERFORMANCE STATUS: {CHL ONC ECOG [id number]  Physical Exam  Constitutional: Oriented to person, place, and time and well-developed, well-nourished, and in no distress. No distress.  HENT:  Head: Normocephalic and atraumatic.  Mouth/Throat: Oropharynx is clear and moist. No oropharyngeal exudate.  Eyes: Conjunctivae are normal. Right eye exhibits no discharge. Left eye exhibits no discharge. No scleral icterus.  Neck: Normal range of motion. Neck supple.  Cardiovascular: Normal rate, regular rhythm, normal heart sounds and intact distal pulses.   Pulmonary/Chest: Effort normal and breath sounds normal. No respiratory distress.  No wheezes. No rales.  Abdominal: Soft. Bowel sounds are normal. Exhibits no distension and no mass. There is no tenderness.  Musculoskeletal: Normal range of motion. Exhibits no edema.  Lymphadenopathy:    No cervical adenopathy.  Neurological: Alert and oriented to person, place, and time. Exhibits normal muscle tone. Gait normal. Coordination normal.  Skin: Skin is warm and dry. No rash noted. Not diaphoretic. No erythema. No pallor.  Psychiatric: Mood, memory and judgment normal.  Vitals reviewed.  LABORATORY DATA: Lab Results  Component Value Date   WBC 6.2 10/08/2022   HGB 14.8 10/08/2022   HCT 42.2 10/08/2022   MCV 89.6 10/08/2022   PLT 208 10/08/2022      Chemistry      Component Value Date/Time   NA 140 10/08/2022 1341   K 3.8 10/08/2022 1341   CL 105 10/08/2022 1341   CO2 31 10/08/2022 1341   BUN 12 10/08/2022 1341   CREATININE 0.87 10/08/2022 1341      Component Value Date/Time   CALCIUM 8.9 10/08/2022 1341   ALKPHOS 77 10/08/2022 1341   AST 14 (L) 10/08/2022 1341   ALT 17 10/08/2022 1341   BILITOT 0.4 10/08/2022 1341       RADIOGRAPHIC STUDIES:  NM PET Image Initial (PI) Skull Base To Thigh (F-18 FDG)  Result Date: 10/11/2022 CLINICAL DATA:  Initial treatment strategy for newly diagnosed tonsillar cancer. EXAM: NUCLEAR MEDICINE PET SKULL BASE TO THIGH TECHNIQUE: 9.3 mCi F-18 FDG was injected intravenously. Full-ring PET imaging was performed from the skull base to thigh after the radiotracer. CT data was obtained and used for attenuation correction and anatomic localization. Fasting blood glucose: 126 mg/dl COMPARISON:  Outside hospital [facility]) CT neck dated 07/17/2022. Outside hospital [facility] CT chest abdomen pelvis dated 07/17/2022. FINDINGS: Mediastinal blood pool activity: SUV max 2.6 Liver activity: SUV max NA NECK: Right palatine tonsillar mass measuring 3.4 cm (series 04/image 29), max SUV 13.6, corresponding to the patient's  known squamous cell carcinoma. This previously measured at least 4.1 cm, reflecting interval improvement. Associated extension to soft palate (series 4/image 23) and right posterior base of tongue (series 4/image 36). Suspected involvement of the contralateral pontine tonsil, max SUV 8.8. Small bilateral cervical nodal metastases, including a 10 mm short axis right level 2 node (series 4/image 32), max SUV 5.0. 7 mm short axis left posterior cervical node (series 4/image 39), max SUV 3.3. Incidental CT findings: None. CHEST: No suspicious pulmonary nodules. No hypermetabolic thoracic lymphadenopathy. Incidental CT findings: Atherosclerotic calcifications of the aortic arch. Mild coronary atherosclerosis of the LAD. ABDOMEN/PELVIS: Mild focal hypermetabolism involving the distal esophagus extending to the GE junction, max SUV 5.3. Associated mild irregularity at the GE junction on CT (series 4/image 100). This appearance is favored to be physiologic/inflammatory but may warrant visual inspection. No abnormal metabolism in the liver, spleen, pancreas,  or adrenal glands. No abnormality abdominopelvic lymphadenopathy. Incidental CT findings: 4 mm nonobstructing left upper pole renal cyst (series 4/image 126). Right double-pigtail ureteral stent. No hydronephrosis. Atherosclerotic calcifications of the abdominal aorta and branch vessels. Left colonic diverticulosis, without evidence of diverticulitis. SKELETON: No focal hypermetabolic activity to suggest skeletal metastasis. Incidental CT findings: Degenerative changes of the lumbar spine. IMPRESSION: Improving right tonsillar mass, corresponding to the patient's to known squamous cell carcinoma. Extension to the right base of tongue and soft palate. Involvement of the contralateral palatine tonsil. Small bilateral cervical nodal metastases, as above. No evidence of distant metastatic disease. Mild focal hypermetabolism involving the distal esophagus extending to the GE  junction, favored to be physiologic/inflammatory. If clinically warranted, consider upper endoscopy for visual inspection. Electronically Signed   By: Charline Bills M.D.   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  He was diagnosed in June 2024.  He was not a good candidate for resection according to the ENT physician at [facility].  He also has a history renal cell carcinoma follow-up 2023.  The patient is scheduled to undergo a course of concurrent chemoradiation with weekly cisplatin 40 mg/m for 7 weeks.  The patient declined Port-A-Cath placement.  He also declined PEG tube placement due to concerns with not having social support to help assist him with this.  He was also referred to nutrition and is scheduled to see them on 10/29/2022.  He is also followed closely by Child psychotherapist for transportation and financial assistance.  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  He is also seeing palliative care for pain management for his throat.  He also was referred to physical therapy for deconditioning and risk of neck lymphedema.  The patient was seen with Dr. Arbutus Ped today.  Dr. Arbutus Ped had a lengthy discussion with the patient today about his current condition and chemotherapy.  Dr. Arbutus Ped feels ***  Same dose, cancel chemotherapy, dose reduction***?  Will see him back for follow-up visit in 2 weeks for evaluation and repeat blood work before undergoing cycle #3.  The patient was advised to call immediately if he has any concerning symptoms in the interval. The patient voices understanding of current disease status and treatment options and is in agreement with the current care plan. All questions were answered. The patient knows to call the clinic with any problems, questions or  concerns. We can certainly see the patient much sooner if necessary    No orders of the defined types were placed in this encounter.    I spent {CHL ONC TIME VISIT - WUJWJ:[id number]} counseling the patient face to face. The total time spent in the appointment was {CHL ONC TIME VISIT - OZHYQ:[id number]}.  Glen Witz L Lisbeth Puller, PA-C 10/24/22
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
## 2022-10-25 ENCOUNTER — Inpatient Hospital Stay (HOSPITAL_BASED_OUTPATIENT_CLINIC_OR_DEPARTMENT_OTHER): Payer: Medicaid Other | Admitting: Nurse Practitioner

## 2022-10-25 ENCOUNTER — Inpatient Hospital Stay: Payer: Medicaid Other

## 2022-10-25 VITALS — BP 133/98 | HR 90 | Temp 98.1°F | Resp 18 | Ht 68.0 in | Wt 197.6 lb

## 2022-10-25 DIAGNOSIS — G893 Neoplasm related pain (acute) (chronic): Secondary | ICD-10-CM | POA: Diagnosis not present

## 2022-10-25 DIAGNOSIS — R530 Neoplastic (malignant) related fatigue: Secondary | ICD-10-CM

## 2022-10-25 DIAGNOSIS — Z7189 Other specified counseling: Secondary | ICD-10-CM

## 2022-10-25 DIAGNOSIS — C109 Malignant neoplasm of oropharynx, unspecified: Secondary | ICD-10-CM | POA: Diagnosis not present

## 2022-10-25 DIAGNOSIS — Z515 Encounter for palliative care: Secondary | ICD-10-CM | POA: Diagnosis not present

## 2022-10-26 ENCOUNTER — Encounter: Payer: Self-pay | Admitting: Nurse Practitioner

## 2022-10-26 ENCOUNTER — Encounter: Payer: Self-pay | Admitting: Internal Medicine

## 2022-10-28 ENCOUNTER — Telehealth: Payer: Self-pay

## 2022-10-28 ENCOUNTER — Inpatient Hospital Stay: Payer: Medicaid Other

## 2022-10-28 ENCOUNTER — Inpatient Hospital Stay: Payer: Medicaid Other | Admitting: Physician Assistant

## 2022-10-28 ENCOUNTER — Telehealth: Payer: Self-pay | Admitting: Radiation Oncology

## 2022-10-28 ENCOUNTER — Ambulatory Visit: Payer: Medicaid Other | Admitting: Radiation Oncology

## 2022-10-28 ENCOUNTER — Other Ambulatory Visit: Payer: Self-pay | Admitting: Urology

## 2022-10-28 ENCOUNTER — Ambulatory Visit: Payer: Medicaid Other

## 2022-10-28 MED FILL — Fosaprepitant Dimeglumine For IV Infusion 150 MG (Base Eq): INTRAVENOUS | Qty: 5 | Status: AC

## 2022-10-28 MED FILL — Dexamethasone Sodium Phosphate Inj 100 MG/10ML: INTRAMUSCULAR | Qty: 1 | Status: AC

## 2022-10-28 NOTE — Telephone Encounter (Signed)
Rn attempted to reach pt without success. Pt voicemail was not set up. RN will call back later this afternoon to inquire about when he will be back in town.

## 2022-10-28 NOTE — Telephone Encounter (Signed)
RN attempted to call patient to inquire when he would be back in town to start radiation treatments. Rn was not able to reach pt and pt has no voicemail set up. Rn will call back later.

## 2022-10-28 NOTE — Telephone Encounter (Signed)
Rn attempted to reach pt for third time without success. Rn will call pt tomorrow to see when he might be back in town for treatment.

## 2022-10-28 NOTE — Telephone Encounter (Signed)
8/26 @ 8:32 Patient called to reschedule his new start treatment appointment for today, due to just getting in town and will not be able to come to his appointment.  Email sent to Support RTT/L4 machine and copied Dr. Basilio Cairo, Victorino Dike Malmfelt/Jennifer Hyacinth Meeker so they are aware.

## 2022-10-29 ENCOUNTER — Ambulatory Visit: Payer: Medicaid Other

## 2022-10-29 ENCOUNTER — Encounter: Payer: Self-pay | Admitting: Internal Medicine

## 2022-10-29 ENCOUNTER — Inpatient Hospital Stay: Payer: Medicaid Other | Admitting: Nutrition

## 2022-10-29 ENCOUNTER — Ambulatory Visit: Payer: Medicaid Other | Admitting: Radiation Oncology

## 2022-10-29 ENCOUNTER — Telehealth: Payer: Self-pay | Admitting: Internal Medicine

## 2022-10-30 ENCOUNTER — Ambulatory Visit: Payer: Medicaid Other

## 2022-10-30 ENCOUNTER — Other Ambulatory Visit: Payer: Self-pay

## 2022-10-30 DIAGNOSIS — C109 Malignant neoplasm of oropharynx, unspecified: Secondary | ICD-10-CM

## 2022-10-31 ENCOUNTER — Ambulatory Visit: Payer: Medicaid Other | Admitting: Internal Medicine

## 2022-10-31 ENCOUNTER — Emergency Department (HOSPITAL_COMMUNITY)
Admission: EM | Admit: 2022-10-31 | Discharge: 2022-11-01 | Disposition: A | Discharge status: Home / Self Care | Payer: Medicaid Other | Source: Home / Self Care | Attending: Emergency Medicine | Admitting: Emergency Medicine

## 2022-10-31 ENCOUNTER — Other Ambulatory Visit: Payer: Self-pay

## 2022-10-31 ENCOUNTER — Encounter (HOSPITAL_COMMUNITY): Payer: Self-pay

## 2022-10-31 ENCOUNTER — Ambulatory Visit: Payer: Medicaid Other

## 2022-10-31 ENCOUNTER — Emergency Department (HOSPITAL_COMMUNITY): Payer: Medicaid Other

## 2022-10-31 DIAGNOSIS — Z8554 Personal history of malignant neoplasm of ureter: Secondary | ICD-10-CM | POA: Diagnosis not present

## 2022-10-31 DIAGNOSIS — F17200 Nicotine dependence, unspecified, uncomplicated: Secondary | ICD-10-CM | POA: Insufficient documentation

## 2022-10-31 DIAGNOSIS — Z85819 Personal history of malignant neoplasm of unspecified site of lip, oral cavity, and pharynx: Secondary | ICD-10-CM | POA: Diagnosis not present

## 2022-10-31 DIAGNOSIS — N50811 Right testicular pain: Secondary | ICD-10-CM | POA: Diagnosis not present

## 2022-10-31 LAB — COMPREHENSIVE METABOLIC PANEL
ALT: 17 U/L (ref 0–44)
AST: 18 U/L (ref 15–41)
Albumin: 3.5 g/dL (ref 3.5–5.0)
Alkaline Phosphatase: 58 U/L (ref 38–126)
Anion gap: 8 (ref 5–15)
BUN: 22 mg/dL — ABNORMAL HIGH (ref 6–20)
CO2: 28 mmol/L (ref 22–32)
Calcium: 8.4 mg/dL — ABNORMAL LOW (ref 8.9–10.3)
Chloride: 101 mmol/L (ref 98–111)
Creatinine, Ser: 1.45 mg/dL — ABNORMAL HIGH (ref 0.61–1.24)
GFR, Estimated: 57 mL/min — ABNORMAL LOW (ref >60)
Glucose, Bld: 122 mg/dL — ABNORMAL HIGH (ref 70–99)
Potassium: 3.9 mmol/L (ref 3.5–5.1)
Sodium: 137 mmol/L (ref 135–145)
Total Bilirubin: 0.6 mg/dL (ref 0.3–1.2)
Total Protein: 6.9 g/dL (ref 6.5–8.1)

## 2022-10-31 LAB — URINALYSIS, ROUTINE W REFLEX MICROSCOPIC
Bacteria, UA: NONE SEEN
Bilirubin Urine: NEGATIVE
Glucose, UA: NEGATIVE mg/dL
Ketones, ur: NEGATIVE mg/dL
Nitrite: NEGATIVE
Protein, ur: 30 mg/dL — AB
Specific Gravity, Urine: 1.026 (ref 1.005–1.030)
pH: 5 (ref 5.0–8.0)

## 2022-10-31 LAB — CBC WITH DIFFERENTIAL/PLATELET
Abs Immature Granulocytes: 0.01 10*3/uL (ref 0.00–0.07)
Basophils Absolute: 0.1 10*3/uL (ref 0.0–0.1)
Basophils Relative: 1 %
Eosinophils Absolute: 0.1 10*3/uL (ref 0.0–0.5)
Eosinophils Relative: 2 %
HCT: 41.8 % (ref 39.0–52.0)
Hemoglobin: 13.5 g/dL (ref 13.0–17.0)
Immature Granulocytes: 0 %
Lymphocytes Relative: 29 %
Lymphs Abs: 2.1 10*3/uL (ref 0.7–4.0)
MCH: 30.3 pg (ref 26.0–34.0)
MCHC: 32.3 g/dL (ref 30.0–36.0)
MCV: 93.9 fL (ref 80.0–100.0)
Monocytes Absolute: 0.8 10*3/uL (ref 0.1–1.0)
Monocytes Relative: 11 %
Neutro Abs: 4.2 10*3/uL (ref 1.7–7.7)
Neutrophils Relative %: 57 %
Platelets: 198 10*3/uL (ref 150–400)
RBC: 4.45 MIL/uL (ref 4.22–5.81)
RDW: 13.9 % (ref 11.5–15.5)
WBC: 7.3 10*3/uL (ref 4.0–10.5)
nRBC: 0 % (ref 0.0–0.2)

## 2022-10-31 LAB — LIPASE, BLOOD: Lipase: 22 U/L (ref 11–51)

## 2022-10-31 MED ORDER — ONDANSETRON 8 MG PO TBDP
8.0000 mg | ORAL_TABLET | Freq: Once | ORAL | Status: AC
  Administered 2022-10-31: 8 mg via ORAL
  Filled 2022-10-31: qty 1

## 2022-10-31 MED ORDER — HYDROCODONE-ACETAMINOPHEN 5-325 MG PO TABS
1.0000 | ORAL_TABLET | Freq: Once | ORAL | Status: AC
  Administered 2022-10-31: 1 via ORAL
  Filled 2022-10-31: qty 1

## 2022-10-31 MED ORDER — KETOROLAC TROMETHAMINE 60 MG/2ML IM SOLN
30.0000 mg | Freq: Once | INTRAMUSCULAR | Status: DC
  Filled 2022-10-31: qty 2

## 2022-10-31 NOTE — ED Provider Notes (Signed)
[place] EMERGENCY DEPARTMENT AT [facility] Provider Note  CSN: [id number] Arrival date & time: 10/31/22 2114  Chief Complaint(s) Groin Pain  HPI Glen CUMPSTON Sr. is a 57 y.o. male with a past medical history listed below who presents to the emergency department with persistent right testicular pain.  Patient was seen earlier in [facility] emergency department in [place] and urology consulted who evaluated the patient and diagnosed him with likely early epididymitis.  They recommended antibiotics, NSAIDs.  Patient already on Percocets.  He reports picking of the Percocets today and not being able to take them yet.  Reports that after receiving a dose of Toradol in the emergency department, he felt better.  Since pain returned and became worse, he presented here for evaluation.    On review of records, patient has a history of right kidney stones status post ureteral stenting for hydronephrosis.  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  Patient is followed by urologist here in [place].  Diagnosed November 2023.  The history is provided by the patient.    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 Authorizing Provider  ibuprofen (ADVIL) 800 MG tablet Take 800 mg by mouth 3 (three) times daily. 10/31/22 11/07/22 Yes [provider]  latanoprost (XALATAN) 0.005 % ophthalmic solution Place 1 drop into the left eye at bedtime.   Yes [provider]  levofloxacin (LEVAQUIN) 500 MG tablet Take 500 mg by mouth daily. 10/31/22 11/09/22 Yes [provider]  lidocaine (XYLOCAINE) 2 % solution Patient: Mix 1part 2% viscous lidocaine, 1part H20. Swallow 10mL of diluted mixture, before meals and at bedtime, up to QID 10/18/22  Yes Lonie Peak, MD  nicotine (NICODERM CQ - DOSED IN MG/24 HOURS) 21 mg/24hr patch Place 1 patch (21 mg total) onto the skin daily. Apply 21 mg patch daily x 6 wk, then 14mg  patch daily x 2 wk, then 7 mg patch daily x 2 wk 10/16/22  Yes Lonie Peak, MD  ondansetron (ZOFRAN-ODT) 4 MG disintegrating tablet Take 4 mg by mouth every 8 (eight) hours as needed for nausea or vomiting. 10/31/22 11/02/22 Yes [provider]  oxybutynin (DITROPAN-XL) 10 MG 24 hr tablet Take 10 mg by mouth daily. 10/31/22  Yes [provider]  oxyCODONE-acetaminophen (PERCOCET/ROXICET) 5-325 MG tablet Take 1 tablet by mouth every 8 (eight) hours as needed for severe pain. 10/31/22 11/05/22 Yes [provider]  Polyvinyl Alcohol (LIQUID TEARS OP) Place 3 drops into the right eye daily as needed (dryness).   Yes [provider]  prochlorperazine (COMPAZINE) 10 MG tablet Take 1 tablet (10 mg total) by mouth  every 6 (six) hours as needed (Nausea or vomiting). 10/16/22  Yes Si Gaul, MD  timolol (TIMOPTIC) 0.25 % ophthalmic solution Place 1 drop into the left eye daily.   Yes [provider]  lidocaine-prilocaine (EMLA) cream Apply to affected area once Patient not taking: Reported on 11/01/2022 10/16/22   Si Gaul, MD  nicotine (NICODERM CQ - DOSED IN MG/24 HOURS) 14 mg/24hr patch Place 1  patch (14 mg total) onto the skin daily. Apply 21 mg patch daily x 6 wk, then 14mg  patch daily x 2 wk, then 7 mg patch daily x 2 wk Patient not taking: Reported on 11/01/2022 10/16/22   Lonie Peak, MD  nicotine (NICODERM CQ - DOSED IN MG/24 HR) 7 mg/24hr patch Place 1 patch (7 mg total) onto the skin daily. Apply 21 mg patch daily x 6 wk, then 14mg  patch daily x 2 wk, then 7 mg patch daily x 2 wk Patient not taking: Reported on 11/01/2022 10/16/22   Lonie Peak, MD  phenazopyridine (PYRIDIUM) 100 MG tablet Take 2 tablets (200 mg total) by mouth 3 (three) times daily as needed for pain. Patient not taking: Reported on 08/07/2022 01/09/22   [name], MD                                                                                                                                    Allergies Other and Sulfa antibiotics  Review of Systems Review of Systems As noted in HPI  Physical Exam Vital Signs  I have reviewed the triage vital signs BP (!) 143/82 (BP Location: Right Arm)   Pulse 78   Temp 98.8 F (37.1 C) (Oral)   Resp 16   SpO2 96%   Physical Exam Vitals reviewed.  Constitutional:      General: He is not in acute distress.    Appearance: He is well-developed. He is not diaphoretic.  HENT:     Head: Normocephalic and atraumatic.     Right Ear: External ear normal.     Left Ear: External ear normal.     Nose: Nose normal.     Mouth/Throat:     Mouth: Mucous membranes are moist.  Eyes:     General: No scleral icterus.    Conjunctiva/sclera: Conjunctivae normal.  Neck:     Trachea: Phonation normal.  Cardiovascular:     Rate and Rhythm: Normal rate and regular rhythm.  Pulmonary:     Effort: Pulmonary effort is normal. No respiratory distress.     Breath sounds: No stridor.  Abdominal:     General: There is no distension.  Genitourinary:    Epididymis:     Right: Tenderness present.     Left: No tenderness.  Musculoskeletal:        General: Normal range of  motion.     Cervical back: Normal range of motion.  Neurological:     Mental Status: He  is alert and oriented to person, place, and time.  Psychiatric:        Behavior: Behavior normal.     ED Results and Treatments Labs (all labs ordered are listed, but only abnormal results are displayed) Labs Reviewed  COMPREHENSIVE METABOLIC PANEL - Abnormal; Notable for the following components:      Result Value   Glucose, Bld 122 (*)    BUN 22 (*)    Creatinine, Ser 1.45 (*)    Calcium 8.4 (*)    GFR, Estimated 57 (*)    All other components within normal limits  URINALYSIS, ROUTINE W REFLEX MICROSCOPIC - Abnormal; Notable for the following components:   Hgb urine dipstick MODERATE (*)    Protein, ur 30 (*)    Leukocytes,Ua SMALL (*)    Non Squamous Epithelial 0-5 (*)    All other components within normal limits  LIPASE, BLOOD  CBC WITH DIFFERENTIAL/PLATELET                                                                                                                         EKG  EKG Interpretation Date/Time:    Ventricular Rate:    PR Interval:    QRS Duration:    QT Interval:    QTC Calculation:   R Axis:      Text Interpretation:         Radiology US SCROTUM W/DOPPLER  Result Date: 11/01/2022 CLINICAL DATA:  Testicular pain. EXAM: SCROTAL ULTRASOUND DOPPLER ULTRASOUND OF THE TESTICLES TECHNIQUE: Complete ultrasound examination of the testicles, epididymis, and other scrotal structures was performed. Color and spectral Doppler ultrasound were also utilized to evaluate blood flow to the testicles. COMPARISON:  None Available. FINDINGS: Right testicle Measurements: 4.3 cm x 1.8 cm x 2.5 cm. No mass or microlithiasis visualized. Left testicle Measurements: 4.7 cm x 2.6 cm x 3.2 cm. No mass or microlithiasis visualized. Right epididymis:  Normal in size and appearance. Left epididymis:  Normal in size and appearance. Hydrocele:  There is a large left-sided hydrocele. Varicocele:   None visualized. Pulsed Doppler interrogation of both testes demonstrates normal low resistance arterial and venous waveforms bilaterally. IMPRESSION: Large left-sided hydrocele. Electronically Signed   By: Aram Candela M.D.   On: 11/01/2022 00:01    Medications Ordered in ED Medications  ketorolac (TORADOL) injection 30 mg (30 mg Intramuscular Patient Refused/Not Given 10/31/22 2343)  HYDROcodone-acetaminophen (NORCO/VICODIN) 5-325 MG per tablet 1 tablet (1 tablet Oral Given 10/31/22 2339)  ondansetron (ZOFRAN-ODT) disintegrating tablet 8 mg (8 mg Oral Given 10/31/22 2340)   Procedures Procedures  (including critical care time) Medical Decision Making / ED Course   Medical Decision Making Amount and/or Complexity of Data Reviewed Labs: ordered.  Risk Prescription drug management.     Clinical Course as of 11/01/22 0200  Thu Oct 31, 2022  2349 Right testicular pain Diagnosed with early epididymitis currently on antibiotics, NSAIDs and narcotic pain medicine. On review of records, no ultrasound was obtained  at the outside ER.  CBC without leukocytosis or anemia.  Metabolic panel without significant electrolyte derangements.  Mild renal sufficiency without AKI.  No bili obstruction or pancreatitis.  UA with elevated leuks likely inflammatory.  No bacteria seen concerning for infection.  Ultrasound here is negative for torsion. [PC]  2350 Patient given a dose of Vicodin in the triage.  IM Toradol given. [PC]    Clinical Course User Index [PC] Bettylou Frew, Amadeo Garnet, MD    Final Clinical Impression(s) / ED Diagnoses Final diagnoses:  Pain in right testicle   The patient appears reasonably screened and/or stabilized for discharge and I doubt any other medical condition or other [facility] requiring further screening, evaluation, or treatment in the ED at this time. I have discussed the findings, Dx and Tx plan with the patient/family who expressed understanding and agree(s) with the  plan. Discharge instructions discussed at length. The patient/family was given strict return precautions who verbalized understanding of the instructions. No further questions at time of discharge.  Disposition: Discharge  Condition: Good  ED Discharge Orders     None       This chart was dictated using voice recognition software.  Despite best efforts to proofread,  errors can occur which can change the documentation meaning.    Nira Conn, MD 11/01/22 0200

## 2022-10-31 NOTE — ED Provider Notes (Incomplete)
[place] EMERGENCY DEPARTMENT AT [facility] Provider Note  CSN: [id number] Arrival date & time: 10/31/22 2114  Chief Complaint(s) Groin Pain  HPI Glen HALABY Sr. is a 57 y.o. male with a past medical history listed below who presents to the emergency department with persistent right testicular pain.  Patient was seen earlier in [facility] emergency department in [place] and urology consulted who evaluated the patient and diagnosed him with likely early epididymitis.  They recommended antibiotics, NSAIDs.  Patient already on Percocets.  He reports picking of the Percocets today and not being able to take them yet.  Reports that after receiving a dose of Toradol in the emergency department, he felt better.  Since pain returned and became worse, he presented here for evaluation.    On review of records, patient has a history of right kidney stones status post ureteral stenting for hydronephrosis.  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  Patient is followed by urologist here in [place].  Diagnosed November 2023.   Groin Pain    Past Medical History Past Medical History:  Diagnosis Date  . Chronic low back pain with sciatica   . History of kidney stones   . Large Right Oropharyngeal mass 07/17/2022   discomfort with swallowing, voice changes  . Legally blind    both eyes  . Ureter cancer, right [facility]) 12/2021   urologist--- dr Marlou Porch;   dx 12-12-2021  s/p  ureteral bx ,  noninvasive low grade papillary urothelial cnacer   Patient Active Problem List   Diagnosis Date Noted  . 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 Ureteral mass 12/12/2021  . Nephrolithiasis 12/10/2021  . Urolithiasis 12/09/2021  . Hydronephrosis of left kidney 12/09/2021  . Renal colic on left side 12/09/2021  . Tobacco use 12/09/2021  . Hypokalemia 12/09/2021  . Hypercalcemia 12/09/2021   Home Medication(s) Prior to Admission medications   Medication Sig  Start Date End Date Taking? Authorizing Provider  latanoprost (XALATAN) 0.005 % ophthalmic solution Place 1 drop into the left eye at bedtime.    [provider]  lidocaine (XYLOCAINE) 2 % solution Patient: Mix 1part 2% viscous lidocaine, 1part H20. Swallow 10mL of diluted mixture, before meals and at bedtime, up to QID 10/18/22   Lonie Peak, MD  lidocaine-prilocaine (EMLA) cream Apply to affected area once 10/16/22   Si Gaul, MD  nicotine (NICODERM CQ - DOSED IN MG/24 HOURS) 14 mg/24hr patch Place 1 patch (14 mg total) onto the skin daily. Apply 21 mg patch daily x 6 wk, then 14mg  patch daily x 2 wk, then 7 mg patch daily x 2 wk 10/16/22   Lonie Peak, MD  nicotine (NICODERM CQ - DOSED IN MG/24 HOURS) 21 mg/24hr patch Place 1 patch (21 mg total) onto the skin daily. Apply 21 mg patch daily x 6 wk, then 14mg  patch daily x 2 wk, then 7 mg patch daily x 2 wk 10/16/22   Lonie Peak, MD  nicotine (NICODERM CQ - DOSED IN MG/24 HR) 7 mg/24hr patch Place 1 patch (7 mg total) onto the skin daily. Apply 21 mg patch daily x 6 wk, then 14mg  patch daily x 2 wk, then 7 mg patch daily x 2 wk 10/16/22   Lonie Peak, MD  phenazopyridine (PYRIDIUM) 100 MG tablet Take 2 tablets (200 mg total) by mouth 3 (three) times daily as needed for pain. Patient not taking: Reported on 08/07/2022 01/09/22   [name], MD  Polyvinyl  Alcohol (LIQUID TEARS OP) Place 3 drops into the right eye daily as needed (dryness).    [provider]  prochlorperazine (COMPAZINE) 10 MG tablet Take 1 tablet (10 mg total) by mouth every 6 (six) hours as needed (Nausea or vomiting). 10/16/22   Si Gaul, MD  timolol (TIMOPTIC) 0.25 % ophthalmic solution Place 1 drop into the left eye daily.    [provider]                                                                                                                                    Allergies Other and Sulfa antibiotics  Review of  Systems Review of Systems As noted in HPI  Physical Exam Vital Signs  I have reviewed the triage vital signs BP (!) 149/97 (BP Location: Left Arm)   Pulse 84   Temp 99.2 F (37.3 C) (Oral)   Resp 20   SpO2 98%  *** Physical Exam Vitals reviewed.  Constitutional:      General: He is not in acute distress.    Appearance: He is well-developed. He is not diaphoretic.  HENT:     Head: Normocephalic and atraumatic.     Right Ear: External ear normal.     Left Ear: External ear normal.     Nose: Nose normal.     Mouth/Throat:     Mouth: Mucous membranes are moist.  Eyes:     General: No scleral icterus.    Conjunctiva/sclera: Conjunctivae normal.  Neck:     Trachea: Phonation normal.  Cardiovascular:     Rate and Rhythm: Normal rate and regular rhythm.  Pulmonary:     Effort: Pulmonary effort is normal. No respiratory distress.     Breath sounds: No stridor.  Abdominal:     General: There is no distension.  Genitourinary:    Epididymis:     Right: Tenderness present.     Left: No tenderness.  Musculoskeletal:        General: Normal range of motion.     Cervical back: Normal range of motion.  Neurological:     Mental Status: He is alert and oriented to person, place, and time.  Psychiatric:        Behavior: Behavior normal.     ED Results and Treatments Labs (all labs ordered are listed, but only abnormal results are displayed) Labs Reviewed  COMPREHENSIVE METABOLIC PANEL - Abnormal; Notable for the following components:      Result Value   Glucose, Bld 122 (*)    BUN 22 (*)    Creatinine, Ser 1.45 (*)    Calcium 8.4 (*)    GFR, Estimated 57 (*)    All other components within normal limits  URINALYSIS, ROUTINE W REFLEX MICROSCOPIC - Abnormal; Notable for the following components:   Hgb urine dipstick MODERATE (*)    Protein, ur 30 (*)    Leukocytes,Ua SMALL (*)  Non Squamous Epithelial 0-5 (*)    All other components within normal limits  LIPASE, BLOOD   CBC WITH DIFFERENTIAL/PLATELET                                                                                                                         EKG  EKG Interpretation Date/Time:    Ventricular Rate:    PR Interval:    QRS Duration:    QT Interval:    QTC Calculation:   R Axis:      Text Interpretation:         Radiology No results found.  Medications Ordered in ED Medications  HYDROcodone-acetaminophen (NORCO/VICODIN) 5-325 MG per tablet 1 tablet (has no administration in time range)  ondansetron (ZOFRAN-ODT) disintegrating tablet 8 mg (has no administration in time range)   Procedures Procedures  (including critical care time) Medical Decision Making / ED Course   Medical Decision Making Amount and/or Complexity of Data Reviewed Labs: ordered.    ***    Final Clinical Impression(s) / ED Diagnoses Final diagnoses:  None    This chart was dictated using voice recognition software.  Despite best efforts to proofread,  errors can occur which can change the documentation meaning.

## 2022-10-31 NOTE — Discharge Instructions (Addendum)
Call your Urologist for a follow up appointment. Wear supportive underwear like briefs to support scrotum to reduce pain.

## 2022-10-31 NOTE — ED Triage Notes (Signed)
Arrives [facility] from home with complaint of groin pain. Says he was seen at [facility] yesterday for same.   ~2 hours prior to arrival began having severe groin / testicle pains. Told he had epididymitis yesterday.   Throat cancer and a urethral mass. Scheduled to start chemo soon.

## 2022-10-31 NOTE — ED Provider Triage Note (Signed)
Emergency Medicine Provider Triage Evaluation Note  Glen Anis Sr. , a 57 y.o. male  was evaluated in triage.  Pt complains of testicular pain.  Was seen at [facility] yesterday.  Had a CT scan performed without an ultrasound.  He states pain is worsened since then.  Review of Systems  Positive: As above Negative: As above  Physical Exam  BP (!) 149/97 (BP Location: Left Arm)   Pulse 84   Temp 99.2 F (37.3 C) (Oral)   Resp 20   SpO2 98%  Gen:   Awake, no distress   Resp:  Normal effort  MSK:   Moves extremities without difficulty  Other:    Medical Decision Making  Medically screening exam initiated at 10:33 PM.  Appropriate orders placed.  Glen Anis Sr. was informed that the remainder of the evaluation will be completed by another provider, this initial triage assessment does not replace that evaluation, and the importance of remaining in the ED until their evaluation is complete.     Marita Kansas, PA-C 10/31/22 2234

## 2022-11-01 ENCOUNTER — Ambulatory Visit: Payer: Medicaid Other

## 2022-11-01 NOTE — ED Notes (Addendum)
Patient ambulated to bathroom independently. Gait steady. Pt offered bus pass on discharge. Pt declined. Pt walked out to waiting room on discharge.

## 2022-11-01 NOTE — ED Notes (Signed)
Verbal consent obtained from patient to update his significant other Mayson Lonie on patient's condition and plan of care. Update provided to Tammy at [phone] at this time.

## 2022-11-05 ENCOUNTER — Inpatient Hospital Stay: Payer: Medicaid Other | Admitting: Physician Assistant

## 2022-11-05 ENCOUNTER — Inpatient Hospital Stay: Payer: Medicaid Other

## 2022-11-05 ENCOUNTER — Ambulatory Visit: Payer: Medicaid Other

## 2022-11-05 ENCOUNTER — Ambulatory Visit: Payer: Medicaid Other | Admitting: Radiation Oncology

## 2022-11-05 ENCOUNTER — Telehealth: Payer: Self-pay | Admitting: Hematology and Oncology

## 2022-11-05 ENCOUNTER — Telehealth: Payer: Self-pay | Admitting: Radiation Oncology

## 2022-11-05 ENCOUNTER — Telehealth: Payer: Self-pay

## 2022-11-05 MED FILL — Dexamethasone Sodium Phosphate Inj 100 MG/10ML: INTRAMUSCULAR | Qty: 1 | Status: AC

## 2022-11-05 MED FILL — Fosaprepitant Dimeglumine For IV Infusion 150 MG (Base Eq): INTRAVENOUS | Qty: 5 | Status: AC

## 2022-11-05 NOTE — Telephone Encounter (Signed)
Rn attempted to call pt to check on him after he left a message about canceling appointments today. He stated in the message he was not feeling well and wanted a call back. Rn attempted to call pt and could not reach him. Message was left on voice mail for pt friend. Rn will call back later.

## 2022-11-05 NOTE — Telephone Encounter (Signed)
9/3 @ 1:06 pm Patient left voicemail that he would like to cancel his appointment for today due to not feeling well with a fever from an infection. Patient scheduled for today at 2:15 pm on L4 machine.  Email sent to Support RTT and Zerita Boers and copy L4 machine and Georga Kaufmann so they are aware.

## 2022-11-05 NOTE — Progress Notes (Deleted)
Palliative Medicine [facility]  [phone] [phone]   Name: Glen THEIL Sr. Date: 11/05/2022 MRN: [id number]  DOB: 03-03-1966  Patient Care Team: Patient, No Pcp Per as PCP - General (General Practice) Pickenpack-Cousar, Arty Baumgartner, NP as Nurse Practitioner (Hospice and Palliative Medicine) Rachel Moulds, MD as Consulting Physician (Hematology and Oncology) Lonie Peak, MD as Consulting Physician (Radiation Oncology) Malmfelt, Lise Auer, RN as Oncology Nurse Navigator    INTERVAL HISTORY: Glen TRAINO Sr. is a 57 y.o. 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 Marlou Porch, and the patient is legally blind. Palliative ask to see for symptom management and goals of care.     SOCIAL HISTORY:     reports that he has been smoking cigarettes. He has never used smokeless tobacco. He reports that he does not drink alcohol and does not use drugs.  ADVANCE DIRECTIVES:  None on file  CODE STATUS: Full code  PAST MEDICAL HISTORY: 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   discomfort with swallowing, voice changes   Legally blind    both eyes   Ureter cancer, right [facility]) 12/2021   urologist--- dr Marlou Porch;   dx 12-12-2021  s/p  ureteral bx ,  noninvasive low grade papillary urothelial cnacer    ALLERGIES:  is allergic to other and sulfa antibiotics.  MEDICATIONS:  Current Outpatient Medications  Medication Sig Dispense Refill   ibuprofen (ADVIL) 800 MG tablet Take 800 mg by mouth 3 (three) times daily.     latanoprost (XALATAN) 0.005 % ophthalmic solution Place 1 drop into the left eye at bedtime.     levofloxacin (LEVAQUIN) 500 MG tablet Take 500 mg by mouth daily.     lidocaine  (XYLOCAINE) 2 % solution Patient: Mix 1part 2% viscous lidocaine, 1part H20. Swallow 10mL of diluted mixture, before meals and at bedtime, up to QID 200 mL 3   lidocaine-prilocaine (EMLA) cream Apply to affected area once (Patient not taking: Reported on 11/01/2022) 30 g 3   nicotine (NICODERM CQ - DOSED IN MG/24 HOURS) 14 mg/24hr patch Place 1 patch (14 mg total) onto the skin daily. Apply 21 mg patch daily x 6 wk, then 14mg  patch daily x 2 wk, then 7 mg patch daily x 2 wk (Patient not taking: Reported on 11/01/2022) 14 patch 0   nicotine (NICODERM CQ - DOSED IN MG/24 HOURS) 21 mg/24hr patch Place 1 patch (21 mg total) onto the skin daily. Apply 21 mg patch daily x 6 wk, then 14mg  patch daily x 2 wk, then 7 mg patch daily x 2 wk 14 patch 2   nicotine (NICODERM CQ - DOSED IN MG/24 HR) 7 mg/24hr patch Place 1 patch (7 mg total) onto the skin daily. Apply 21 mg patch daily x 6 wk, then 14mg  patch daily x 2 wk, then 7 mg patch daily x 2 wk (Patient not taking: Reported on 11/01/2022) 14 patch 0   oxybutynin (DITROPAN-XL) 10 MG 24 hr tablet Take 10 mg by mouth daily.     oxyCODONE-acetaminophen (PERCOCET/ROXICET) 5-325 MG tablet Take 1 tablet by mouth every 8 (eight) hours as needed for severe pain.     phenazopyridine (PYRIDIUM) 100 MG tablet Take 2 tablets (200  mg total) by mouth 3 (three) times daily as needed for pain. (Patient not taking: Reported on 08/07/2022) 20 tablet 0   Polyvinyl Alcohol (LIQUID TEARS OP) Place 3 drops into the right eye daily as needed (dryness).     prochlorperazine (COMPAZINE) 10 MG tablet Take 1 tablet (10 mg total) by mouth every 6 (six) hours as needed (Nausea or vomiting). 30 tablet 1   timolol (TIMOPTIC) 0.25 % ophthalmic solution Place 1 drop into the left eye daily.     No current facility-administered medications for this visit.    VITAL SIGNS: There were no vitals taken for this visit. There were no vitals filed for this visit.  Estimated body mass index is  30.04 kg/m as calculated from the following:   Height as of 10/25/22: 5\' 8"  (1.727 m).   Weight as of 10/25/22: 197 lb 9.6 oz (89.6 kg).   PERFORMANCE STATUS (ECOG) : 1 - Symptomatic but completely ambulatory   Physical Exam General: NAD Cardiovascular: regular rate and rhythm Pulmonary: clear ant fields Abdomen: soft, nontender, + bowel sounds Extremities: no edema, no joint deformities Skin: no rashes Neurological:   IMPRESSION: Throat Pain/Discomfort Shay speaks to some throat discomfort however gains relief with use of lidocaine. No oral thrush or ulcerations noted on exam. Does not have to use lidocaine solution daily. Feels his throat discomfort is manageable. We discussed his pain and use of oral medications in liquid or pill form. [name] is not interested in pursuing pain medication at this time. Shares no desire to initiate use of opioids unless absolutely necessary and last resort. He does not feel his pain is severe, he knows at any point he reaches a level of discomfort that is unmanageable we can discuss other options.    Goals of Care  10/25/22- We discussed his current illness and what it means in the larger context of his on-going co-morbidities. Natural disease trajectory and expectations were discussed.   Mr. Glen Mcmillan understanding of current diagnosis and treatment plan. He is prepared to begin radiation treatments on next week. He is somewhat anxious about the fear of the unknown. I created space and opportunity allowing him to express his thoughts and feelings. He speaks to fear of his inability to eat and possibility of needing a feeding tube. Emotional support provided. We discussed the use of artificial feedings and feeding tube placement in the setting of cancer. He verbalized understanding however expresses at this time he is against placement of a feeding tube unless he does not have another option. Shares he plans to make every effort to support his  nutritional needs making any necessary adjustments including the use of liquids and baby foods. If he reaches a point he cannot meet his nutritional needs or take anything by mouth and has to make a life dependent decision he would then choose to proceed with a feeding tube to sustain his life and opportunity to continue thriving.    Zahid states he is not "100% sold on starting chemotherapy". He is meeting with his Renato Gails and a spiritual leader which he shares they plan to discuss his health over the weekend. He plans to seek answers and guidance in decisions through prayer. Support provided. I inquired regarding questions or worries in regards to him pursuing treatment. Mukhtar states he becomes anxious and overwhelmed when to many things happen all at once. He prefers for things to happen in phases and steps "one at a time". He shares his mother's journey with  breast cancer and living to the age of 38. Speaking of her strength and knowing if she can go through treatments he must also show his strength. I recommended continuing discussions with his Oncology team at upcoming appointment on Monday also sharing that in some cancers to allow for best outcomes chemoradiation simultaneously are recommended. He verbalized understanding.    We discussed importance of expressing his feelings. Mr. Foronda knows that he has a team that is here to support him including palliative and our social work team. Both teams are readily available to allow him to openly discuss his thoughts and feelings allowing him to navigate through this journey in support of his Oncologist/Radiation Oncologist. We discussed ways to cope and manage any anxiety and fears that he may have. He verbalized understanding and appreciation.    We discussed Her current illness and what it means in the larger context of Her on-going co-morbidities. Natural disease trajectory and expectations were discussed.  I discussed the importance of continued  conversation with family and their medical providers regarding overall plan of care and treatment options, ensuring decisions are within the context of the patients values and GOCs.  PLAN: Established therapeutic relationship. Education provided on palliative's role in collaboration with their Oncology/Radiation team. No symptom management needs at this time Patient declines further pain management. Endorses some throat discomfort however feels this is managed with occasional use of lidocaine solution as needed. Is not interested in opioid use at this time.  Extensive goals of care discussions. Patient is clear in expressed wishes to treat the treatable allowing him every opportunity to continue thriving. Somewhat anxious about the fear of the unknowns related to treatments. I will plan to see patient back in 1-2 weeks in collaboration to other oncology appointments.    Patient expressed understanding and was in agreement with this plan. He also understands that He can call the clinic at any time with any questions, concerns, or complaints.   Any controlled substances utilized were prescribed in the context of palliative care. PDMP has been reviewed.    Visit consisted of counseling and education dealing with the complex and emotionally intense issues of symptom management and palliative care in the setting of serious and potentially life-threatening illness.Greater than 50%  of this time was spent counseling and coordinating care related to the above assessment and plan.  Willette Alma, AGPCNP-BC  Palliative Medicine Team/[facility]  *Please note that this is a verbal dictation therefore any spelling or grammatical errors are due to the "Dragon Medical One" system interpretation.

## 2022-11-05 NOTE — Telephone Encounter (Signed)
Rn was able to reach pt after several phone calls. Pt stated on this phone call he was running fevers (since Sunday, last was 102 today), he was also having nausea/ vomiting as well. He states he tried to drink an ensure to take his antibiotic and started dry heaving immediately. Pt also stated on Wednesday he went the ED in [place] due to severe genital pain. They admitted him and d/c'ed him on Thursday. He then had to go back to the emergency room this weekend in severe pain.Pt reports that his genital is pushing into his stomach causing severe pain. He also states that the doctors felt this may be related to his existing urology stents and current mass next to his Kidney.  Per pt they sent him home with antibiotics and pain medication. He states he has been running fevers since Sunday and has not felt well overall. He is going to see urology again today to follow up. He will call RN back with what they say.

## 2022-11-06 ENCOUNTER — Inpatient Hospital Stay: Payer: Medicaid Other | Admitting: Nurse Practitioner

## 2022-11-06 ENCOUNTER — Ambulatory Visit: Payer: Medicaid Other

## 2022-11-06 ENCOUNTER — Inpatient Hospital Stay: Payer: Medicaid Other | Admitting: Dietician

## 2022-11-06 ENCOUNTER — Other Ambulatory Visit: Payer: Medicaid Other

## 2022-11-06 ENCOUNTER — Inpatient Hospital Stay: Payer: Medicaid Other | Attending: Internal Medicine

## 2022-11-06 ENCOUNTER — Telehealth: Payer: Self-pay

## 2022-11-06 DIAGNOSIS — C109 Malignant neoplasm of oropharynx, unspecified: Secondary | ICD-10-CM | POA: Insufficient documentation

## 2022-11-06 NOTE — Telephone Encounter (Signed)
Rn called pt to check on him after his doctors appointment with his urologist. According to pt they sent him to continue his medications and they did give him recommendations how to manage his symptoms better. He did not feel well enough to come to treatment today (still running fevers). Pt reported that the urology office told him it was ok to come to treatments if he was not having fevers. Pt will check his temperature in the morning and call RN by 10 am with an update. Rn will also call pt by 10am if he does not call the office. Rn has also been in communication with transportation about this pt as well. Rn will continue to follow up and monitor the situation.

## 2022-11-07 ENCOUNTER — Ambulatory Visit: Payer: Medicaid Other | Admitting: Physical Therapy

## 2022-11-07 ENCOUNTER — Inpatient Hospital Stay: Payer: Medicaid Other

## 2022-11-07 ENCOUNTER — Telehealth: Payer: Self-pay

## 2022-11-07 ENCOUNTER — Ambulatory Visit: Payer: Medicaid Other | Admitting: Radiation Oncology

## 2022-11-07 ENCOUNTER — Ambulatory Visit: Payer: Medicaid Other

## 2022-11-07 NOTE — Telephone Encounter (Signed)
RN called pt to inquire about starting treatment this week. He stated he would like to start next week to give himself more time to recover from his urology infection. He would like to start on Monday 9-9. Rn called support RT and also let our transportation team know as well. RN will continue to follow this pt.

## 2022-11-08 ENCOUNTER — Ambulatory Visit: Payer: Medicaid Other

## 2022-11-11 ENCOUNTER — Other Ambulatory Visit: Payer: Medicaid Other

## 2022-11-11 ENCOUNTER — Inpatient Hospital Stay: Payer: Medicaid Other

## 2022-11-11 ENCOUNTER — Inpatient Hospital Stay: Payer: Medicaid Other | Admitting: Hematology and Oncology

## 2022-11-11 ENCOUNTER — Ambulatory Visit: Payer: Medicaid Other

## 2022-11-11 ENCOUNTER — Ambulatory Visit: Payer: Medicaid Other | Admitting: Radiation Oncology

## 2022-11-11 ENCOUNTER — Ambulatory Visit: Payer: Medicaid Other | Admitting: Physician Assistant

## 2022-11-11 ENCOUNTER — Encounter: Payer: Medicaid Other | Admitting: Nutrition

## 2022-11-12 ENCOUNTER — Encounter: Payer: Self-pay | Admitting: Radiation Oncology

## 2022-11-12 ENCOUNTER — Ambulatory Visit: Payer: Medicaid Other

## 2022-11-12 NOTE — Progress Notes (Addendum)
Oncology Nurse Navigator Documentation   On Monday 11/11/22 our Radiation support team received a call from Glen Mcmillan cancelling his appointments here at [facility] cancer center. He asked to hold any plans for future treatment as well. He had many questions about his recent scans and expressed concerns that he has not received enough information about his recommended treatment for his head and neck cancer. I have called him and left a VM with my direct number asking for a call back. I'm happy to talk with him and answer as many questions as I can and also get him scheduled with an Oncologist to also address any questions and concerns he may have regarding the recommended treatment for his disease.   Hedda Slade RN, BSN, OCN Head & Neck Oncology Nurse Navigator [facility] at [facility] Phone # [phone]  Fax # [phone]

## 2022-11-12 NOTE — Progress Notes (Signed)
I called Mr. Stitt as he has canceled multiple radiation treatments and expressed to our staff that he is considering not getting treatment at all.  We spoke about his concerns.  We talked about his concern that a provider at the emergency room had mentioned to him that his PET scan had actually shown improvement of his tumor without any treatment.  I reminded him that he had undergone a biopsy of his tumor between the CT scan and his PET scan which may have accounted for some slight changes in the tumor size.  I assured him that I am his advocate and will continue to advise him and treat him with his best interest at heart.  After a long and productive conversation the patient wants to proceed with radiation but states that he is out of town for the majority of this week.  He states that he will be here on Monday to start his treatment.  He understands the importance of coming 5 days a week and not missing treatments to get the best chance of cure.  I have strong reservations about the patient's ability to tolerate chemotherapy at the same time as radiation.  I have expressed these reservations to the medical oncology team.  I will confirm with the medical oncology team that the patient does not want chemotherapy and I believe they have already canceled his upcoming appointments.  I will notify our navigator of the developments above as he will need quite a bit of navigation and help from social work, including possible assistance with transportation and housing as available.  He also needs to remain in close contact with palliative care for pain control in the future. -----------------------------------  Lonie Peak, MD

## 2022-11-13 ENCOUNTER — Encounter: Payer: Medicaid Other | Admitting: Dietician

## 2022-11-13 ENCOUNTER — Ambulatory Visit: Payer: Medicaid Other

## 2022-11-14 ENCOUNTER — Ambulatory Visit: Payer: Medicaid Other

## 2022-11-15 ENCOUNTER — Ambulatory Visit: Payer: Medicaid Other

## 2022-11-15 NOTE — Progress Notes (Signed)
Oncology Nurse Navigator Documentation   I have made multiple attempts over the past few days to call Glen Mcmillan to remind him of his first radiation treatment at 2:15 on 9/16. He has not answered his phone and his voicemail has been full so I have been unable to leave a message. I will attempt to contact him again on Monday and will follow up as needed.  Hedda Slade RN, BSN, OCN Head & Neck Oncology Nurse Navigator [facility] at [facility] Phone # [phone]  Fax # [phone]

## 2022-11-18 ENCOUNTER — Ambulatory Visit: Payer: Medicaid Other

## 2022-11-18 ENCOUNTER — Ambulatory Visit: Payer: Medicaid Other | Admitting: Radiation Oncology

## 2022-11-18 ENCOUNTER — Ambulatory Visit: Payer: Medicaid Other | Admitting: Adult Health

## 2022-11-18 ENCOUNTER — Telehealth: Payer: Self-pay

## 2022-11-18 ENCOUNTER — Other Ambulatory Visit: Payer: Medicaid Other

## 2022-11-18 NOTE — Telephone Encounter (Signed)
Pt called RN to let staff know of the situation with his mother passing. He stated to RN he felt like he would make his appointment tomorrow. Rn encouraged pt to let Rn know by 12 noon if pt would not be coming to his treatment.

## 2022-11-19 ENCOUNTER — Telehealth: Payer: Self-pay

## 2022-11-19 ENCOUNTER — Ambulatory Visit: Payer: Medicaid Other

## 2022-11-19 ENCOUNTER — Other Ambulatory Visit: Payer: Self-pay

## 2022-11-19 ENCOUNTER — Inpatient Hospital Stay: Payer: Medicaid Other | Admitting: Licensed Clinical Social Worker

## 2022-11-19 ENCOUNTER — Ambulatory Visit
Admission: RE | Admit: 2022-11-19 | Discharge: 2022-11-19 | Disposition: A | Discharge status: Home / Self Care | Payer: Medicaid Other | Source: Ambulatory Visit | Attending: Radiation Oncology | Admitting: Radiation Oncology

## 2022-11-19 DIAGNOSIS — C109 Malignant neoplasm of oropharynx, unspecified: Secondary | ICD-10-CM

## 2022-11-19 LAB — RAD ONC ARIA SESSION SUMMARY
Course Elapsed Days: 0
Plan Fractions Treated to Date: 1
Plan Prescribed Dose Per Fraction: 2 Gy
Plan Total Fractions Prescribed: 35
Plan Total Prescribed Dose: 70 Gy
Reference Point Dosage Given to Date: 2 Gy
Reference Point Session Dosage Given: 2 Gy
Session Number: 1

## 2022-11-19 NOTE — Telephone Encounter (Signed)
Pt called RN to ask about when he would possibly get a feeding tube. He was inquiring because the home he currently lives in has fleas and he does not want open wounds or areas. He did state he is working on a new place to live since his disability has been approved. RN stated she did not know when/ if he will need a feeding tube. Rn educated pt we would monitor this situation. Next the pt asked about a grant to help with the utility bill at the current house he is staying. Rn will reach out to Ronda Fairly to contact pt on this concern.

## 2022-11-19 NOTE — Telephone Encounter (Signed)
Rn attempted to call pt to see if pt was coming to treatment today. Rn got no answer and pt had no voicemail available.

## 2022-11-19 NOTE — Progress Notes (Signed)
Oncology Nurse Navigator Documentation   To provide support, encouragement and care continuity, met with Mr. Accomando for his initial RT.   I reviewed the 2-step treatment process, answered questions.  Mr. Souder completed treatment without difficulty, denied questions/concerns. I reviewed the registration/arrival procedure for subsequent treatments. I accompanied him to meet with SW.  I encouraged him to call me with questions/concerns as treatments proceed.   Hedda Slade RN, BSN, OCN Head & Neck Oncology Nurse Navigator  Cancer Center at [facility] Phone # [phone]  Fax # [phone]

## 2022-11-20 ENCOUNTER — Encounter: Payer: Self-pay | Admitting: Internal Medicine

## 2022-11-20 ENCOUNTER — Other Ambulatory Visit: Payer: Self-pay

## 2022-11-20 ENCOUNTER — Ambulatory Visit: Payer: Medicaid Other

## 2022-11-20 ENCOUNTER — Encounter: Payer: Medicaid Other | Admitting: Dietician

## 2022-11-20 ENCOUNTER — Ambulatory Visit
Admission: RE | Admit: 2022-11-20 | Discharge: 2022-11-20 | Disposition: A | Discharge status: Home / Self Care | Payer: Medicaid Other | Source: Ambulatory Visit | Attending: Radiation Oncology | Admitting: Radiation Oncology

## 2022-11-20 DIAGNOSIS — C109 Malignant neoplasm of oropharynx, unspecified: Secondary | ICD-10-CM | POA: Diagnosis not present

## 2022-11-20 LAB — RAD ONC ARIA SESSION SUMMARY
Course Elapsed Days: 1
Plan Fractions Treated to Date: 2
Plan Prescribed Dose Per Fraction: 2 Gy
Plan Total Fractions Prescribed: 35
Plan Total Prescribed Dose: 70 Gy
Reference Point Dosage Given to Date: 4 Gy
Reference Point Session Dosage Given: 2 Gy
Session Number: 2

## 2022-11-20 NOTE — Progress Notes (Signed)
[facility] CSW Progress Note  Visual merchandiser met with patient in infusion to discuss financial and living situation concerns.  Pt provided with resources to apply for housing.  Pt states he has another friend he is staying with some of the time and was informed today that he was approved for SSI.  Once pt starts to receive his SSI checks he is hopeful he will be able to secure independent housing.  CSW assisted pt w/ applying for [employer].  Letter from the friends pt is staying w/ scanned to Glen Mcmillan as well as a Risk analyst.  Pt signed acknowledgement form for grant at reception and received paperwork outlining how the grant can be utilized.  Pt has contact details for CSW to address future issues as needed.      Rachel Moulds, LCSW Clinical Social Worker [facility]    Patient is participating in a Managed Medicaid Plan:  Yes

## 2022-11-21 ENCOUNTER — Ambulatory Visit: Payer: Medicaid Other

## 2022-11-21 ENCOUNTER — Ambulatory Visit
Admission: RE | Admit: 2022-11-21 | Discharge: 2022-11-21 | Disposition: A | Discharge status: Home / Self Care | Payer: Medicaid Other | Source: Ambulatory Visit | Attending: Radiation Oncology | Admitting: Radiation Oncology

## 2022-11-21 ENCOUNTER — Encounter: Payer: Self-pay | Admitting: Hematology and Oncology

## 2022-11-21 ENCOUNTER — Other Ambulatory Visit: Payer: Self-pay

## 2022-11-21 DIAGNOSIS — C109 Malignant neoplasm of oropharynx, unspecified: Secondary | ICD-10-CM | POA: Diagnosis not present

## 2022-11-21 LAB — RAD ONC ARIA SESSION SUMMARY
Course Elapsed Days: 2
Plan Fractions Treated to Date: 3
Plan Prescribed Dose Per Fraction: 2 Gy
Plan Total Fractions Prescribed: 35
Plan Total Prescribed Dose: 70 Gy
Reference Point Dosage Given to Date: 6 Gy
Reference Point Session Dosage Given: 2 Gy
Session Number: 3

## 2022-11-21 NOTE — Progress Notes (Signed)
Received request from registration for a gas card for patient after he received one on yesterday.  Advised patient cannot receive an additional card and he can call me with questions.  Patient called and I advised patient per his paperwork, he can only receive one card a week on the weeks he has treatment or appointment at cancer center. Patient was talking over me and says the lady downstairs told him he can receive one everyday he does not use our transportation and I told him that was incorrect. He said we are paying that much anyway for [facility]. Advised patient these are two different accounts. He said he may as well use [place] and he hung up the phone.  He has my card for any additional financial questions or concerns.

## 2022-11-22 ENCOUNTER — Ambulatory Visit: Payer: Medicaid Other

## 2022-11-22 ENCOUNTER — Ambulatory Visit
Admission: RE | Admit: 2022-11-22 | Discharge: 2022-11-22 | Disposition: A | Discharge status: Home / Self Care | Payer: Medicaid Other | Source: Ambulatory Visit | Attending: Radiation Oncology | Admitting: Radiation Oncology

## 2022-11-22 ENCOUNTER — Ambulatory Visit: Payer: Medicaid Other | Admitting: Hematology and Oncology

## 2022-11-22 ENCOUNTER — Other Ambulatory Visit: Payer: Self-pay

## 2022-11-22 ENCOUNTER — Ambulatory Visit
Admission: RE | Admit: 2022-11-22 | Discharge: 2022-11-22 | Disposition: A | Discharge status: Home / Self Care | Payer: Medicaid Other | Source: Ambulatory Visit | Attending: Radiation Oncology

## 2022-11-22 ENCOUNTER — Telehealth: Payer: Self-pay

## 2022-11-22 DIAGNOSIS — C109 Malignant neoplasm of oropharynx, unspecified: Secondary | ICD-10-CM | POA: Diagnosis not present

## 2022-11-22 LAB — CBC WITH DIFFERENTIAL (CANCER CENTER ONLY)
Abs Immature Granulocytes: 0.01 10*3/uL (ref 0.00–0.07)
Basophils Absolute: 0 10*3/uL (ref 0.0–0.1)
Basophils Relative: 1 %
Eosinophils Absolute: 0.1 10*3/uL (ref 0.0–0.5)
Eosinophils Relative: 2 %
HCT: 42.2 % (ref 39.0–52.0)
Hemoglobin: 14.2 g/dL (ref 13.0–17.0)
Immature Granulocytes: 0 %
Lymphocytes Relative: 32 %
Lymphs Abs: 1.9 10*3/uL (ref 0.7–4.0)
MCH: 30.4 pg (ref 26.0–34.0)
MCHC: 33.6 g/dL (ref 30.0–36.0)
MCV: 90.4 fL (ref 80.0–100.0)
Monocytes Absolute: 0.5 10*3/uL (ref 0.1–1.0)
Monocytes Relative: 8 %
Neutro Abs: 3.4 10*3/uL (ref 1.7–7.7)
Neutrophils Relative %: 57 %
Platelet Count: 201 10*3/uL (ref 150–400)
RBC: 4.67 MIL/uL (ref 4.22–5.81)
RDW: 13.9 % (ref 11.5–15.5)
WBC Count: 6 10*3/uL (ref 4.0–10.5)
nRBC: 0 % (ref 0.0–0.2)

## 2022-11-22 LAB — BASIC METABOLIC PANEL - CANCER CENTER ONLY
Anion gap: 7 (ref 5–15)
BUN: 13 mg/dL (ref 6–20)
CO2: 30 mmol/L (ref 22–32)
Calcium: 9.3 mg/dL (ref 8.9–10.3)
Chloride: 102 mmol/L (ref 98–111)
Creatinine: 1.06 mg/dL (ref 0.61–1.24)
GFR, Estimated: >60 mL/min (ref >60)
Glucose, Bld: 166 mg/dL — ABNORMAL HIGH (ref 70–99)
Potassium: 3.6 mmol/L (ref 3.5–5.1)
Sodium: 139 mmol/L (ref 135–145)

## 2022-11-22 LAB — RAD ONC ARIA SESSION SUMMARY
Course Elapsed Days: 3
Plan Fractions Treated to Date: 4
Plan Prescribed Dose Per Fraction: 2 Gy
Plan Total Fractions Prescribed: 35
Plan Total Prescribed Dose: 70 Gy
Reference Point Dosage Given to Date: 8 Gy
Reference Point Session Dosage Given: 2 Gy
Session Number: 4

## 2022-11-22 LAB — MAGNESIUM: Magnesium: 1.7 mg/dL (ref 1.7–2.4)

## 2022-11-22 LAB — TSH: TSH: 0.851 u[IU]/mL (ref 0.350–4.500)

## 2022-11-22 NOTE — Telephone Encounter (Signed)
Rn called Glen Mcmillan to let him know that Dr. Basilio Cairo would discuss the anxiety medication with him on Monday. He stated that would be fine. Glen Mcmillan did report to RN on phone that there have been issues the mask we are using for his radiation. He states it does not fit well and pops off the table. He also stated that he feels like the mask has created tooth pain from his eye tooth and has cause two of his fillings to pop out. Rn will seek feedback from Dr. Basilio Cairo and have her evaluate situation, along with contacting radiation therapist as well. Glen Mcmillan also told RN that he lost bottle of Lidocaine and would like more if possible. Rn will place an under treat appointment in for Glen Mcmillan to see Dr. Basilio Cairo.

## 2022-11-25 ENCOUNTER — Other Ambulatory Visit: Payer: Medicaid Other

## 2022-11-25 ENCOUNTER — Other Ambulatory Visit: Payer: Self-pay

## 2022-11-25 ENCOUNTER — Other Ambulatory Visit: Payer: Self-pay | Admitting: Radiation Oncology

## 2022-11-25 ENCOUNTER — Ambulatory Visit: Payer: Medicaid Other | Admitting: Hematology and Oncology

## 2022-11-25 ENCOUNTER — Ambulatory Visit
Admission: RE | Admit: 2022-11-25 | Discharge: 2022-11-25 | Disposition: A | Discharge status: Home / Self Care | Payer: Medicaid Other | Source: Ambulatory Visit | Attending: Radiation Oncology | Admitting: Radiation Oncology

## 2022-11-25 ENCOUNTER — Ambulatory Visit: Payer: Medicaid Other

## 2022-11-25 ENCOUNTER — Ambulatory Visit: Payer: Medicaid Other | Admitting: Physician Assistant

## 2022-11-25 ENCOUNTER — Inpatient Hospital Stay: Payer: Medicaid Other

## 2022-11-25 DIAGNOSIS — C109 Malignant neoplasm of oropharynx, unspecified: Secondary | ICD-10-CM

## 2022-11-25 LAB — RAD ONC ARIA SESSION SUMMARY
Course Elapsed Days: 6
Plan Fractions Treated to Date: 5
Plan Prescribed Dose Per Fraction: 2 Gy
Plan Total Fractions Prescribed: 35
Plan Total Prescribed Dose: 70 Gy
Reference Point Dosage Given to Date: 10 Gy
Reference Point Session Dosage Given: 2 Gy
Session Number: 5

## 2022-11-25 MED ORDER — SONAFINE EX EMUL
1.0000 | Freq: Once | CUTANEOUS | Status: AC
  Administered 2022-11-25: 1 via TOPICAL

## 2022-11-25 MED ORDER — PROCHLORPERAZINE MALEATE 10 MG PO TABS
10.0000 mg | ORAL_TABLET | Freq: Four times a day (QID) | ORAL | 1 refills | Status: DC | PRN
Start: 2022-11-25 — End: 2023-01-09

## 2022-11-25 MED ORDER — PROCHLORPERAZINE MALEATE 10 MG PO TABS
ORAL_TABLET | ORAL | Status: AC
  Filled 2022-11-25: qty 1

## 2022-11-25 MED ORDER — PROCHLORPERAZINE MALEATE 10 MG PO TABS
10.0000 mg | ORAL_TABLET | Freq: Once | ORAL | Status: AC
  Administered 2022-11-25: 10 mg via ORAL
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
## 2022-11-26 ENCOUNTER — Ambulatory Visit
Admission: RE | Admit: 2022-11-26 | Discharge: 2022-11-26 | Disposition: A | Discharge status: Home / Self Care | Payer: Medicaid Other | Source: Ambulatory Visit | Attending: Radiation Oncology

## 2022-11-26 ENCOUNTER — Ambulatory Visit: Payer: Medicaid Other

## 2022-11-26 ENCOUNTER — Other Ambulatory Visit: Payer: Self-pay

## 2022-11-26 ENCOUNTER — Encounter: Payer: Medicaid Other | Admitting: Nutrition

## 2022-11-26 ENCOUNTER — Encounter: Payer: Medicaid Other | Admitting: Dietician

## 2022-11-26 ENCOUNTER — Inpatient Hospital Stay: Payer: Medicaid Other | Admitting: Licensed Clinical Social Worker

## 2022-11-26 DIAGNOSIS — C109 Malignant neoplasm of oropharynx, unspecified: Secondary | ICD-10-CM | POA: Diagnosis not present

## 2022-11-26 LAB — RAD ONC ARIA SESSION SUMMARY
Course Elapsed Days: 7
Plan Fractions Treated to Date: 6
Plan Prescribed Dose Per Fraction: 2 Gy
Plan Total Fractions Prescribed: 35
Plan Total Prescribed Dose: 70 Gy
Reference Point Dosage Given to Date: 12 Gy
Reference Point Session Dosage Given: 2 Gy
Session Number: 6

## 2022-11-26 MED FILL — Fosaprepitant Dimeglumine For IV Infusion 150 MG (Base Eq): INTRAVENOUS | Qty: 5 | Status: AC

## 2022-11-26 MED FILL — Dexamethasone Sodium Phosphate Inj 100 MG/10ML: INTRAMUSCULAR | Qty: 1 | Status: AC

## 2022-11-26 NOTE — Progress Notes (Signed)
RN spoke with pt prior to today's treatment. RN made sure that pt knew to let the staff know upstairs if he has been waiting for his ride. Pt understood these instructions. Pt also advised that Dr. Basilio Cairo did not recommend the use of the deworming product he is currently using. Pt continued to note ongoing anxiety and stated he did know know how much longer he could continue treatment. Rn will continue to monitor pt and work with needs that arise.

## 2022-11-26 NOTE — Progress Notes (Signed)
[facility] CSW Progress Note  Visual merchandiser  received a call from a case worker at [employer] requesting assistance with obtaining pt's medical records to determine if pt is eligible for disability.  CSW provided case worker w/ the contact number for medical records.  CSW also received a call from pt who inquired if he could get a gas card if someone drove him to his appointment today.  CSW instructed pt to ask at reception, but he should be eligible if he did not utilize the cancer center's transportation services.       Rachel Moulds, LCSW Clinical Social Worker [facility]    Patient is participating in a Managed Medicaid Plan:  Yes

## 2022-11-27 ENCOUNTER — Encounter: Payer: Self-pay | Admitting: Internal Medicine

## 2022-11-27 ENCOUNTER — Ambulatory Visit
Admission: RE | Admit: 2022-11-27 | Discharge: 2022-11-27 | Disposition: A | Discharge status: Home / Self Care | Payer: Medicaid Other | Source: Ambulatory Visit | Attending: Radiation Oncology | Admitting: Radiation Oncology

## 2022-11-27 ENCOUNTER — Encounter: Payer: Self-pay | Admitting: Nurse Practitioner

## 2022-11-27 ENCOUNTER — Other Ambulatory Visit: Payer: Self-pay

## 2022-11-27 ENCOUNTER — Ambulatory Visit: Payer: Medicaid Other

## 2022-11-27 ENCOUNTER — Inpatient Hospital Stay (HOSPITAL_BASED_OUTPATIENT_CLINIC_OR_DEPARTMENT_OTHER): Payer: Medicaid Other | Admitting: Nurse Practitioner

## 2022-11-27 ENCOUNTER — Other Ambulatory Visit (HOSPITAL_COMMUNITY): Payer: Self-pay

## 2022-11-27 VITALS — BP 110/85 | HR 95 | Resp 18

## 2022-11-27 DIAGNOSIS — C109 Malignant neoplasm of oropharynx, unspecified: Secondary | ICD-10-CM | POA: Diagnosis not present

## 2022-11-27 DIAGNOSIS — G893 Neoplasm related pain (acute) (chronic): Secondary | ICD-10-CM | POA: Diagnosis not present

## 2022-11-27 DIAGNOSIS — Z515 Encounter for palliative care: Secondary | ICD-10-CM | POA: Diagnosis not present

## 2022-11-27 DIAGNOSIS — F419 Anxiety disorder, unspecified: Secondary | ICD-10-CM

## 2022-11-27 LAB — RAD ONC ARIA SESSION SUMMARY
Course Elapsed Days: 8
Plan Fractions Treated to Date: 7
Plan Prescribed Dose Per Fraction: 2 Gy
Plan Total Fractions Prescribed: 35
Plan Total Prescribed Dose: 70 Gy
Reference Point Dosage Given to Date: 14 Gy
Reference Point Session Dosage Given: 2 Gy
Session Number: 7

## 2022-11-27 MED ORDER — DIAZEPAM 5 MG PO TABS
5.0000 mg | ORAL_TABLET | Freq: Three times a day (TID) | ORAL | 0 refills | Status: DC | PRN
Start: 2022-11-27 — End: 2023-06-11
  Filled 2022-11-27: qty 30, 10d supply, fill #0

## 2022-11-28 ENCOUNTER — Ambulatory Visit: Payer: Medicaid Other

## 2022-11-28 ENCOUNTER — Telehealth: Payer: Self-pay | Admitting: Radiation Oncology

## 2022-11-28 ENCOUNTER — Inpatient Hospital Stay: Payer: Medicaid Other | Admitting: Dietician

## 2022-11-28 ENCOUNTER — Encounter: Payer: Self-pay | Admitting: Internal Medicine

## 2022-11-28 NOTE — Telephone Encounter (Signed)
9/26 @ 2:45 pm Received call from [place] [employer]); issues with getting patient here on time due to lack of communication from patient.  Called and spoke to [place] (L4 machine), no more availability at this time.  Called and spoke to [employer] (Nutrition) willing to see patient but it depends on patient and being here for his treatments first.  Is ok for him to come in on next Thursday after his treatments.

## 2022-11-28 NOTE — Progress Notes (Unsigned)
Scheduled to see pt following radiation. Received call from scheduling Marylene Land) who reports patient called stating he messed up on his transportation today. Radiation unable see patient at a later time today. Appointments cancelled. Patient has a nutrition appointment 10/3.

## 2022-11-29 ENCOUNTER — Ambulatory Visit: Payer: Medicaid Other

## 2022-11-29 ENCOUNTER — Other Ambulatory Visit: Payer: Self-pay

## 2022-11-29 ENCOUNTER — Ambulatory Visit
Admission: RE | Admit: 2022-11-29 | Discharge: 2022-11-29 | Disposition: A | Discharge status: Home / Self Care | Payer: Medicaid Other | Source: Ambulatory Visit | Attending: Radiation Oncology

## 2022-11-29 DIAGNOSIS — C109 Malignant neoplasm of oropharynx, unspecified: Secondary | ICD-10-CM | POA: Diagnosis not present

## 2022-11-29 LAB — RAD ONC ARIA SESSION SUMMARY
Course Elapsed Days: 10
Plan Fractions Treated to Date: 8
Plan Prescribed Dose Per Fraction: 2 Gy
Plan Total Fractions Prescribed: 35
Plan Total Prescribed Dose: 70 Gy
Reference Point Dosage Given to Date: 16 Gy
Reference Point Session Dosage Given: 2 Gy
Session Number: 8

## 2022-12-02 ENCOUNTER — Inpatient Hospital Stay: Payer: Medicaid Other

## 2022-12-02 ENCOUNTER — Other Ambulatory Visit: Payer: Self-pay

## 2022-12-02 ENCOUNTER — Ambulatory Visit
Admission: RE | Admit: 2022-12-02 | Discharge: 2022-12-02 | Disposition: A | Discharge status: Home / Self Care | Payer: Medicaid Other | Source: Ambulatory Visit | Attending: Radiation Oncology

## 2022-12-02 ENCOUNTER — Ambulatory Visit: Payer: Medicaid Other | Admitting: Adult Health

## 2022-12-02 ENCOUNTER — Emergency Department (HOSPITAL_COMMUNITY)
Admission: EM | Admit: 2022-12-02 | Discharge: 2022-12-03 | Discharge status: Against Medical Advice | Payer: Medicaid Other

## 2022-12-02 ENCOUNTER — Emergency Department (HOSPITAL_COMMUNITY)
Admission: EM | Admit: 2022-12-02 | Discharge: 2022-12-02 | Disposition: A | Discharge status: Home / Self Care | Payer: Medicaid Other

## 2022-12-02 ENCOUNTER — Other Ambulatory Visit: Payer: Medicaid Other

## 2022-12-02 ENCOUNTER — Emergency Department (HOSPITAL_COMMUNITY): Payer: Medicaid Other

## 2022-12-02 ENCOUNTER — Ambulatory Visit: Payer: Medicaid Other

## 2022-12-02 DIAGNOSIS — Z01812 Encounter for preprocedural laboratory examination: Secondary | ICD-10-CM | POA: Insufficient documentation

## 2022-12-02 DIAGNOSIS — H547 Unspecified visual loss: Secondary | ICD-10-CM | POA: Insufficient documentation

## 2022-12-02 DIAGNOSIS — Z5321 Procedure and treatment not carried out due to patient leaving prior to being seen by health care provider: Secondary | ICD-10-CM | POA: Insufficient documentation

## 2022-12-02 DIAGNOSIS — C109 Malignant neoplasm of oropharynx, unspecified: Secondary | ICD-10-CM | POA: Diagnosis not present

## 2022-12-02 LAB — RAD ONC ARIA SESSION SUMMARY
Course Elapsed Days: 13
Plan Fractions Treated to Date: 9
Plan Prescribed Dose Per Fraction: 2 Gy
Plan Total Fractions Prescribed: 35
Plan Total Prescribed Dose: 70 Gy
Reference Point Dosage Given to Date: 18 Gy
Reference Point Session Dosage Given: 2 Gy
Session Number: 9

## 2022-12-02 LAB — COMPREHENSIVE METABOLIC PANEL
ALT: 15 U/L (ref 0–44)
AST: 15 U/L (ref 15–41)
Albumin: 4 g/dL (ref 3.5–5.0)
Alkaline Phosphatase: 62 U/L (ref 38–126)
Anion gap: 8 (ref 5–15)
BUN: 15 mg/dL (ref 6–20)
CO2: 30 mmol/L (ref 22–32)
Calcium: 8.8 mg/dL — ABNORMAL LOW (ref 8.9–10.3)
Chloride: 98 mmol/L (ref 98–111)
Creatinine, Ser: 0.99 mg/dL (ref 0.61–1.24)
GFR, Estimated: >60 mL/min (ref >60)
Glucose, Bld: 186 mg/dL — ABNORMAL HIGH (ref 70–99)
Potassium: 3.7 mmol/L (ref 3.5–5.1)
Sodium: 136 mmol/L (ref 135–145)
Total Bilirubin: 0.9 mg/dL (ref 0.3–1.2)
Total Protein: 7.7 g/dL (ref 6.5–8.1)

## 2022-12-02 LAB — CBC WITH DIFFERENTIAL/PLATELET
Abs Immature Granulocytes: 0.01 10*3/uL (ref 0.00–0.07)
Basophils Absolute: 0.1 10*3/uL (ref 0.0–0.1)
Basophils Relative: 1 %
Eosinophils Absolute: 0.2 10*3/uL (ref 0.0–0.5)
Eosinophils Relative: 3 %
HCT: 44 % (ref 39.0–52.0)
Hemoglobin: 14.7 g/dL (ref 13.0–17.0)
Immature Granulocytes: 0 %
Lymphocytes Relative: 22 %
Lymphs Abs: 1.2 10*3/uL (ref 0.7–4.0)
MCH: 30.8 pg (ref 26.0–34.0)
MCHC: 33.4 g/dL (ref 30.0–36.0)
MCV: 92.1 fL (ref 80.0–100.0)
Monocytes Absolute: 0.6 10*3/uL (ref 0.1–1.0)
Monocytes Relative: 10 %
Neutro Abs: 3.6 10*3/uL (ref 1.7–7.7)
Neutrophils Relative %: 64 %
Platelets: 174 10*3/uL (ref 150–400)
RBC: 4.78 MIL/uL (ref 4.22–5.81)
RDW: 13.5 % (ref 11.5–15.5)
WBC: 5.6 10*3/uL (ref 4.0–10.5)
nRBC: 0 % (ref 0.0–0.2)

## 2022-12-02 LAB — SEDIMENTATION RATE: Sed Rate: 14 mm/h (ref 0–16)

## 2022-12-02 LAB — TROPONIN I (HIGH SENSITIVITY): Troponin I (High Sensitivity): 3 ng/L (ref <18)

## 2022-12-02 MED ORDER — DIPHENHYDRAMINE HCL 50 MG/ML IJ SOLN
12.5000 mg | Freq: Once | INTRAMUSCULAR | Status: DC
  Filled 2022-12-02: qty 1

## 2022-12-02 MED ORDER — TETRACAINE HCL 0.5 % OP SOLN
1.0000 [drp] | Freq: Once | OPHTHALMIC | Status: AC
  Administered 2022-12-02: 1 [drp] via OPHTHALMIC
  Filled 2022-12-02: qty 4

## 2022-12-02 MED ORDER — PROCHLORPERAZINE EDISYLATE 10 MG/2ML IJ SOLN
5.0000 mg | Freq: Once | INTRAMUSCULAR | Status: DC
  Filled 2022-12-02: qty 2

## 2022-12-02 NOTE — Discharge Instructions (Addendum)
Your CT scan did not show any major issues when the radiologist looked at it today.  Please go directly to the ophthalmologist office for a comprehensive eye exam.  Please follow-up as directed by the ophthalmologist.  If he does should come back to the emergency department please return for further evaluation or follow-up accordingly as discussed.  If his door is locked his number to call is [phone] and he said to call that when you arrive if you have any difficulty getting in.

## 2022-12-02 NOTE — ED Triage Notes (Signed)
Walked from [employer] radiation dept.Patient woke up with migraine and loss of vision. Last night had bad heartburn. Took 15 TUMS, but no heartburn now. Hx of glaucoma. Pt AO x4. RR 15 O2 sats 100 HR 80 BP 107/78.

## 2022-12-02 NOTE — Progress Notes (Signed)
Pt arrived to clinic for PUT today c/o headache and vision changes. He stated everything is blurry and lost peripheral vision as well at times. He states things look cloudy and does have a history of glaucoma. He was using old eye drops at home. Pt also told RN that he had a headache as well. Pt vital signs were stable throughout visit. Pt did get his radiation treatment today after speaking with Dr. Basilio Cairo. Pt was then escorted to the ED by RN in stable condition at 1445.

## 2022-12-02 NOTE — ED Provider Notes (Signed)
[place] EMERGENCY DEPARTMENT AT [facility] Provider Note   CSN: [id number] Arrival date & time: 12/02/22  1445     History  Chief Complaint  Patient presents with   Migraine    Lost vision except for periphery    Glen Anis Sr. is a 57 y.o. male.  57 year old male with past medical history of glaucoma and metastatic cancer presenting to the emergency department today with decreased vision in his left eye with frontal headache.  The patient states this began this morning when he woke up.  He denies any focal weakness, numbness, or tingling.  He has been using his glaucoma medications as prescribed.  He states that the visual field deficit is mostly in the medial aspect of his left eye.  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  The patient states that he is having a mild frontal headache with this.  Denies any associated nausea or vomiting.  He denies any fevers.  The patient states he does has oropharyngeal cancer and is currently undergoing radiation treatments.  He states that he thinks that there is some involvement in the sinuses.   Migraine       Home Medications Prior to Admission medications   Medication Sig Start Date End Date Taking? Authorizing Provider  diazepam (VALIUM) 5 MG tablet Take 1 tablet (5 mg total) by mouth every 8 (eight) hours as needed for anxiety. 11/27/22   Pickenpack-Cousar, Arty Baumgartner, NP  latanoprost (XALATAN) 0.005 % ophthalmic solution Place 1 drop into the left eye at bedtime.    [provider]  lidocaine (XYLOCAINE) 2 % solution Patient: Mix 1part 2% viscous lidocaine, 1part H20. Swallow 10mL of diluted mixture, before meals and at bedtime, up to QID 10/18/22   Lonie Peak, MD  lidocaine-prilocaine (EMLA) cream Apply to affected area once Patient not taking: Reported on 11/01/2022 10/16/22   Si Gaul, MD  nicotine (NICODERM CQ - DOSED IN MG/24 HOURS) 14 mg/24hr patch Place 1 patch (14 mg total) onto the skin daily.  Apply 21 mg patch daily x 6 wk, then 14mg  patch daily x 2 wk, then 7 mg patch daily x 2 wk Patient not taking: Reported on 11/01/2022 10/16/22   Lonie Peak, MD  nicotine (NICODERM CQ - DOSED IN MG/24 HOURS) 21 mg/24hr patch Place 1 patch (21 mg total) onto the skin daily. Apply 21 mg patch daily x 6 wk, then 14mg  patch daily x 2 wk, then 7 mg patch daily x 2 wk 10/16/22   Lonie Peak, MD  nicotine (NICODERM CQ - DOSED IN MG/24 HR) 7 mg/24hr patch Place 1 patch (7 mg total) onto the skin daily. Apply 21 mg patch daily x 6 wk, then 14mg  patch daily x 2 wk, then 7 mg patch daily x 2 wk Patient not taking: Reported on 11/01/2022 10/16/22   Lonie Peak, MD  oxybutynin (DITROPAN-XL) 10 MG 24 hr tablet Take 10 mg by mouth daily. 10/31/22   [provider]  phenazopyridine (PYRIDIUM) 100 MG tablet Take 2 tablets (200 mg total) by mouth 3 (three) times daily as needed for pain. Patient not taking: Reported on 08/07/2022 01/09/22   [name], MD  Polyvinyl Alcohol (LIQUID TEARS OP) Place 3 drops into the right eye daily as needed (dryness).    [provider]  prochlorperazine (COMPAZINE) 10 MG tablet Take 1 tablet (10 mg total) by mouth every 6 (six) hours as needed (Nausea or vomiting). 11/25/22   Lonie Peak, MD  timolol (TIMOPTIC) 0.25 % ophthalmic solution Place 1 drop into the left eye daily.    [provider]      Allergies    Other and Sulfa antibiotics    Review of Systems   Review of Systems  Eyes:  Positive for visual disturbance.  All other systems reviewed and are negative.   Physical Exam Updated Vital Signs BP 107/78   Pulse 77   Temp 98 F (36.7 C)   Resp 16   Ht 5\' 8"  (1.727 m)   Wt 91.6 kg   SpO2 99%   BMI 30.71 kg/m  Physical Exam Vitals and nursing note reviewed.   Gen: NAD Eyes: PERRL, EOMI HEENT: no oropharyngeal swelling Neck: trachea midline Resp: clear to auscultation bilaterally Card: RRR, no murmurs, rubs, or  gallops Abd: nontender, nondistended Extremities: no calf tenderness, no edema Vascular: 2+ radial pulses bilaterally, 2+ DP pulses bilaterally Neuro: Right immuno PSY noted, the patient and her exam is otherwise unremarkable with NIH stroke scale of 1 Skin: no rashes Psyc: acting appropriately   ED Results / Procedures / Treatments   Labs (all labs ordered are listed, but only abnormal results are displayed) Labs Reviewed  BASIC METABOLIC PANEL  CBC  TROPONIN I (HIGH SENSITIVITY)  TROPONIN I (HIGH SENSITIVITY)    EKG None  Radiology CT Head Wo Contrast  Result Date: 12/02/2022 CLINICAL DATA:  Headache and visual changes. EXAM: CT HEAD WITHOUT CONTRAST TECHNIQUE: Contiguous axial images were obtained from the base of the skull through the vertex without intravenous contrast. RADIATION DOSE REDUCTION: This exam was performed according to the departmental dose-optimization program which includes automated exposure control, adjustment of the mA and/or kV according to patient size and/or use of iterative reconstruction technique. COMPARISON:  CT orbits 06/19/2007 FINDINGS: Brain: There is no evidence of an acute infarct, intracranial hemorrhage, mass, midline shift, or extra-axial fluid collection. The ventricles and sulci are normal. Vascular: No hyperdense vessel. Skull: No acute fracture or suspicious osseous lesion. Sinuses/Orbits: Visualized paranasal sinuses and mastoid air cells are clear. Right orbital prosthesis. Unchanged metallic foreign bodies within both orbits. Other: None. These results were called by telephone at the time of interpretation on 12/02/2022 at 4:45 pm to Dr. Beckey Downing, who verbally acknowledged these results. IMPRESSION: Unremarkable CT appearance of the brain. Electronically Signed   By: Sebastian Ache M.D.   On: 12/02/2022 16:52    Procedures Procedures    Medications Ordered in ED Medications  prochlorperazine (COMPAZINE) injection 5 mg (0 mg Intravenous  Hold 12/02/22 1707)  diphenhydrAMINE (BENADRYL) injection 12.5 mg (0 mg Intravenous Hold 12/02/22 1707)  tetracaine (PONTOCAINE) 0.5 % ophthalmic solution 1 drop (1 drop Left Eye Given 12/02/22 1602)    ED Course/ Medical Decision Making/ A&P                                 Medical Decision Making 57 year old male with past medical history of glaucoma and oropharyngeal cancer presenting to the emergency department today with visual field cut.  I will further evaluate the patient here with a CT scan of his head.  Labs were recommended but the patient is declining an IV at this time.  I will check his pressures here and reevaluate.  His exam is somewhat difficult to interpret since the patient does have a surgically absent right eye.  I will reevaluate for ultimate disposition.  Our Tono-Pen is not functioning correctly  here and I am unable to get a reading on the patient's eye pressures.  I did discuss this with Dr. Vonna Kotyk from ophthalmology who recommends having the patient sent and to see him immediately for further evaluation.  I did call and spoke with Dr. Mosetta Putt from radiology who quickly read the patient's CT scan and reported that there were no obvious large lesions to account for the patient's symptoms.  He will be discharged to go straight to be evaluated by ophthalmology.  The patient initially was refusing blood work and after speaking with Dr. Vonna Kotyk the plan will be to have the patient go over to have a thorough eye examination and if there is not an obvious reason for his symptoms will be sent back to the emergency department for further evaluation.  I discussed this with the patient and he is in agreement with this plan.  Amount and/or Complexity of Data Reviewed Labs: ordered. Radiology: ordered.  Risk Prescription drug management.           Final Clinical Impression(s) / ED Diagnoses Final diagnoses:  Vision loss    Rx / DC Orders ED Discharge Orders     None          Durwin Glaze, MD 12/02/22 [phone]

## 2022-12-02 NOTE — ED Triage Notes (Signed)
Pt seen today 2 hours ago after radiation , pt was discharged, but then called by MD, and was told he needed additional lab work. Lab work being completed in triage.

## 2022-12-03 ENCOUNTER — Telehealth: Payer: Self-pay

## 2022-12-03 ENCOUNTER — Ambulatory Visit: Admission: RE | Admit: 2022-12-03 | Payer: Medicaid Other | Source: Ambulatory Visit

## 2022-12-03 ENCOUNTER — Ambulatory Visit: Payer: Medicaid Other

## 2022-12-03 ENCOUNTER — Encounter: Payer: Medicaid Other | Admitting: Dietician

## 2022-12-03 LAB — C-REACTIVE PROTEIN: CRP: 1.3 mg/dL — ABNORMAL HIGH (ref <1.0)

## 2022-12-03 NOTE — Telephone Encounter (Signed)
RN called pt to check on him after his trip to the ED yesterday. Pt stated in the ED they found his eye pressures to be very elevated and sent him to the eye doctor from the ED. He also stated that his lab work was elevated and he had to go back to the ED for that lab work. With the phone call today he stated he was on his way to [facility] to see another doctor to follow up on the eye and lab work concern. He did also report his mild headache remains. Pt is very worried about losing his vision. Pt will not make it for treatment today. Rn will continue to monitor pt.

## 2022-12-04 ENCOUNTER — Ambulatory Visit: Payer: Medicaid Other

## 2022-12-04 ENCOUNTER — Inpatient Hospital Stay: Payer: Medicaid Other | Attending: Internal Medicine | Admitting: Licensed Clinical Social Worker

## 2022-12-04 ENCOUNTER — Telehealth: Payer: Self-pay

## 2022-12-04 DIAGNOSIS — C109 Malignant neoplasm of oropharynx, unspecified: Secondary | ICD-10-CM

## 2022-12-04 NOTE — Telephone Encounter (Signed)
RN called pt to check on him since he was admitted to [facility] yesterday. Pt told RN that they are running test on his brain and also doing more blood work. He stated he may need immediate eye surgery to save his vision. Pt did state he was nervous about missing treatment and what it would mean for his cancer. Rn encouraged pt to take care of his eye and vision first and we would take care of the rest when he was able to. RN will check on pt tomorrow.

## 2022-12-04 NOTE — Progress Notes (Signed)
[facility] CSW Progress Note  Clinical Child psychotherapist  returned pt's call.  Pt experiencing visual impairment and reports he was sent to [facility] and has been admitted.  Pt originally called to see if he could get a gas card for transportation there, but was able to get to the hospital independently.  CSW to remain available as appropriate to provide support throughout duration of treatment.        Rachel Moulds, LCSW Clinical Social Worker [facility]    Patient is participating in a Managed Medicaid Plan:  Yes

## 2022-12-05 ENCOUNTER — Inpatient Hospital Stay: Payer: Medicaid Other | Admitting: Nutrition

## 2022-12-05 ENCOUNTER — Ambulatory Visit: Payer: Medicaid Other

## 2022-12-05 ENCOUNTER — Encounter: Payer: Self-pay | Admitting: Nutrition

## 2022-12-05 ENCOUNTER — Ambulatory Visit: Payer: Medicaid Other | Admitting: Physical Therapy

## 2022-12-05 NOTE — Progress Notes (Signed)
Patient admitted to hospital.  Nutrition appointment canceled.

## 2022-12-06 ENCOUNTER — Ambulatory Visit: Payer: Medicaid Other

## 2022-12-09 ENCOUNTER — Ambulatory Visit: Payer: Medicaid Other | Admitting: Hematology and Oncology

## 2022-12-09 ENCOUNTER — Telehealth: Payer: Self-pay

## 2022-12-09 ENCOUNTER — Ambulatory Visit: Payer: Medicaid Other

## 2022-12-09 ENCOUNTER — Other Ambulatory Visit: Payer: Medicaid Other

## 2022-12-09 ENCOUNTER — Ambulatory Visit: Payer: Medicaid Other | Attending: Radiation Oncology

## 2022-12-09 ENCOUNTER — Ambulatory Visit: Payer: Medicaid Other | Admitting: Physician Assistant

## 2022-12-09 DIAGNOSIS — C109 Malignant neoplasm of oropharynx, unspecified: Secondary | ICD-10-CM | POA: Insufficient documentation

## 2022-12-09 NOTE — Telephone Encounter (Signed)
RN attempted to call pt again with no success. RN left call back details with message.

## 2022-12-09 NOTE — Telephone Encounter (Signed)
RN left message for pt friend after failed attempts to reach pt on his phone. Rn will continue to attempt to reach pt to check on him and his status.

## 2022-12-09 NOTE — Telephone Encounter (Signed)
RN called and left message for pt in an attempt to check on him. Pt had been in the hospital and RN was checking to see if he was still there. RN left call back details in message. RN will call back later.

## 2022-12-09 NOTE — Telephone Encounter (Signed)
RN called [facility] to see if pt was still in patient at their facility. The operator told RN there was not pt by that name. Rn is unable to reach pt and has left several messages. RN will continue to try to reach pt and check on his status for treatment this week.

## 2022-12-09 NOTE — Telephone Encounter (Signed)
RN left second message for pt with call back details. RN was attempting to call and check on pt  status since he had been in the hospital last week. Rn will call back later.

## 2022-12-10 ENCOUNTER — Ambulatory Visit: Payer: Medicaid Other

## 2022-12-10 ENCOUNTER — Encounter: Payer: Medicaid Other | Admitting: Dietician

## 2022-12-10 ENCOUNTER — Telehealth: Payer: Self-pay

## 2022-12-10 NOTE — Progress Notes (Deleted)
Palliative Medicine [facility]  Telephone:(336) [phone] Fax:(336) [phone]   Name: Glen Mcmillan. Date: 12/10/2022 MRN: [id number]  DOB: [birth year]  Patient Care Team: Patient, No Pcp Per as PCP - General (General Practice) Pickenpack-Cousar, Arty Baumgartner, NP as Nurse Practitioner (Hospice and Palliative Medicine) Rachel Moulds, MD as Consulting Physician (Hematology and Oncology) Lonie Peak, MD as Consulting Physician (Radiation Oncology) Malmfelt, Lise Auer, RN as Oncology Nurse Navigator Thersa Salt, MD (Otolaryngology)    INTERVAL HISTORY: Glen REEL Mcmillan. is a 57 y.o. 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 Marlou Porch, and the patient is legally blind. Palliative ask to see for symptom management and goals of care.     SOCIAL HISTORY:     reports that he has been smoking cigarettes. He has never used smokeless tobacco. He reports that he does not drink alcohol and does not use drugs.  ADVANCE DIRECTIVES:  None on file  CODE STATUS: Full code  PAST MEDICAL HISTORY: 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   discomfort with swallowing, voice changes   Legally blind    both eyes   Ureter cancer, right [facility]) 12/2021   urologist--- dr Marlou Porch;   dx 12-12-2021  s/p  ureteral bx ,  noninvasive low grade papillary urothelial cnacer    ALLERGIES:  is allergic to other and sulfa antibiotics.  MEDICATIONS:  Current Outpatient Medications  Medication Sig Dispense Refill   diazepam (VALIUM) 5 MG tablet Take 1 tablet (5 mg total) by mouth every 8 (eight) hours as needed for anxiety. 30 tablet 0   latanoprost (XALATAN) 0.005 % ophthalmic solution Place 1 drop into the left eye at bedtime.      lidocaine (XYLOCAINE) 2 % solution Patient: Mix 1part 2% viscous lidocaine, 1part H20. Swallow 10mL of diluted mixture, before meals and at bedtime, up to QID 200 mL 3   lidocaine-prilocaine (EMLA) cream Apply to affected area once (Patient not taking: Reported on 11/01/2022) 30 g 3   nicotine (NICODERM CQ - DOSED IN MG/24 HOURS) 14 mg/24hr patch Place 1 patch (14 mg total) onto the skin daily. Apply 21 mg patch daily x 6 wk, then 14mg  patch daily x 2 wk, then 7 mg patch daily x 2 wk (Patient not taking: Reported on 11/01/2022) 14 patch 0   nicotine (NICODERM CQ - DOSED IN MG/24 HOURS) 21 mg/24hr patch Place 1 patch (21 mg total) onto the skin daily. Apply 21 mg patch daily x 6 wk, then 14mg  patch daily x 2 wk, then 7 mg patch daily x 2 wk 14 patch 2   nicotine (NICODERM CQ - DOSED IN MG/24 HR) 7 mg/24hr patch Place 1 patch (7 mg total) onto the skin daily. Apply 21 mg patch daily x 6 wk, then 14mg  patch daily x 2 wk, then 7 mg patch daily x 2 wk (Patient not taking: Reported on 11/01/2022) 14 patch 0   oxybutynin (DITROPAN-XL) 10 MG 24 hr tablet Take 10 mg by mouth daily.     phenazopyridine (PYRIDIUM) 100 MG tablet Take 2 tablets (200 mg total) by mouth 3 (three) times daily as needed for pain. (Patient not taking: Reported on 08/07/2022) 20 tablet 0   Polyvinyl Alcohol (  LIQUID TEARS OP) Place 3 drops into the right eye daily as needed (dryness).     prochlorperazine (COMPAZINE) 10 MG tablet Take 1 tablet (10 mg total) by mouth every 6 (six) hours as needed (Nausea or vomiting). 30 tablet 1   timolol (TIMOPTIC) 0.25 % ophthalmic solution Place 1 drop into the left eye daily.     No current facility-administered medications for this visit.    VITAL SIGNS: There were no vitals taken for this visit. There were no vitals filed for this visit.  Estimated body mass index is 30.71 kg/m as calculated from the following:   Height as of 12/02/22: 5\' 8"  (1.727 m).   Weight as of 12/02/22: 202 lb (91.6  kg).   PERFORMANCE STATUS (ECOG) : 1 - Symptomatic but completely ambulatory   Physical Exam General: NAD Cardiovascular: regular rate and rhythm Pulmonary: clear ant fields Abdomen: soft, nontender, + bowel sounds Extremities: no edema, no joint deformities Skin: no rashes Neurological: AAOx3  IMPRESSION:   Throat Pain/Discomfort [name] reports occasional throat pain and discomfort however feels this is manageable.  Does not require daily medication use for pain.  He understands we will continue to closely monitor and support as needed.  He is much appreciative that he is not having much discomfort in the way of pain at this time compared to months prior.    Anxiety   Goals of Care  10/25/22- We discussed his current illness and what it means in the larger context of his on-going co-morbidities. Natural disease trajectory and expectations were discussed.   Mr. Glen Mcmillan understanding of current diagnosis and treatment plan. He is prepared to begin radiation treatments on next week. He is somewhat anxious about the fear of the unknown. I created space and opportunity allowing him to express his thoughts and feelings. He speaks to fear of his inability to eat and possibility of needing a feeding tube. Emotional support provided. We discussed the use of artificial feedings and feeding tube placement in the setting of cancer. He verbalized understanding however expresses at this time he is against placement of a feeding tube unless he does not have another option. Shares he plans to make every effort to support his nutritional needs making any necessary adjustments including the use of liquids and baby foods. If he reaches a point he cannot meet his nutritional needs or take anything by mouth and has to make a life dependent decision he would then choose to proceed with a feeding tube to sustain his life and opportunity to continue thriving.    [name] states he is not "100% sold on  starting chemotherapy". He is meeting with his Renato Gails and a spiritual leader which he shares they plan to discuss his health over the weekend. He plans to seek answers and guidance in decisions through prayer. Support provided. I inquired regarding questions or worries in regards to him pursuing treatment. Lawton states he becomes anxious and overwhelmed when to many things happen all at once. He prefers for things to happen in phases and steps "one at a time". He shares his mother's journey with breast cancer and living to the age of 63. Speaking of her strength and knowing if she can go through treatments he must also show his strength. I recommended continuing discussions with his Oncology team at upcoming appointment on Monday also sharing that in some cancers to allow for best outcomes chemoradiation simultaneously are recommended. He verbalized understanding.    We discussed importance of expressing  his feelings. Mr. Kraeger knows that he has a team that is here to support him including palliative and our social work team. Both teams are readily available to allow him to openly discuss his thoughts and feelings allowing him to navigate through this journey in support of his Oncologist/Radiation Oncologist. We discussed ways to cope and manage any anxiety and fears that he may have. He verbalized understanding and appreciation.    We discussed Her current illness and what it means in the larger context of Her on-going co-morbidities. Natural disease trajectory and expectations were discussed.  I discussed the importance of continued conversation with family and their medical providers regarding overall plan of care and treatment options, ensuring decisions are within the context of the patients values and GOCs.  PLAN: Valium 5 mg every 12 hours as needed for anxiety Patient declines further pain management. Endorses some throat discomfort however feels this is managed with occasional use of lidocaine  solution as needed. Is not interested in opioid use at this time.  Extensive goals of care discussions. Patient is clear in expressed wishes to treat the treatable allowing him every opportunity to continue thriving. Somewhat anxious about the fear of the unknowns related to treatments. I will plan to see patient back in 1-2 weeks in collaboration to other oncology appointments.    Patient expressed understanding and was in agreement with this plan. He also understands that He can call the clinic at any time with any questions, concerns, or complaints.   Any controlled substances utilized were prescribed in the context of palliative care. PDMP has been reviewed.    Visit consisted of counseling and education dealing with the complex and emotionally intense issues of symptom management and palliative care in the setting of serious and potentially life-threatening illness.Greater than 50%  of this time was spent counseling and coordinating care related to the above assessment and plan.  Willette Alma, AGPCNP-BC  Palliative Medicine Team/[facility]  *Please note that this is a verbal dictation therefore any spelling or grammatical errors are due to the "Dragon Medical One" system interpretation.

## 2022-12-10 NOTE — Telephone Encounter (Signed)
RN left message for pt after continued failed attempts to reach pt.

## 2022-12-10 NOTE — Telephone Encounter (Signed)
RN unable to reach pt this am. Message left for pt with call back details. RN was attempting to check on pt status and condition since being in hospital.

## 2022-12-11 ENCOUNTER — Ambulatory Visit: Payer: Medicaid Other

## 2022-12-11 ENCOUNTER — Telehealth: Payer: Self-pay
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
## 2022-12-12 ENCOUNTER — Inpatient Hospital Stay: Payer: Medicaid Other | Admitting: Nurse Practitioner

## 2022-12-12 ENCOUNTER — Ambulatory Visit: Payer: Medicaid Other

## 2022-12-13 ENCOUNTER — Inpatient Hospital Stay: Payer: Medicaid Other | Admitting: Dietician

## 2022-12-13 ENCOUNTER — Ambulatory Visit: Payer: Medicaid Other

## 2022-12-16 ENCOUNTER — Ambulatory Visit: Payer: Medicaid Other

## 2022-12-16 ENCOUNTER — Ambulatory Visit: Admission: RE | Admit: 2022-12-16 | Payer: Medicaid Other | Source: Ambulatory Visit

## 2022-12-16 ENCOUNTER — Encounter: Payer: Self-pay | Admitting: Radiation Oncology

## 2022-12-16 ENCOUNTER — Telehealth: Payer: Self-pay

## 2022-12-16 NOTE — Telephone Encounter (Signed)
RN left message after failed attempt to reach pt to see if he was coming to his under treat visit and his radiation. RN will continue to attempt to reach out to pt.
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
## 2022-12-16 NOTE — Telephone Encounter (Signed)
RN attempted to call pt again after he has not shown up for under treat or radiation appointment. RN again left call back details for pt.

## 2022-12-17 ENCOUNTER — Ambulatory Visit: Payer: Medicaid Other

## 2022-12-17 ENCOUNTER — Inpatient Hospital Stay: Payer: Medicaid Other | Admitting: Nutrition

## 2022-12-17 ENCOUNTER — Telehealth: Payer: Self-pay

## 2022-12-17 ENCOUNTER — Encounter: Payer: Self-pay | Admitting: Nutrition

## 2022-12-17 NOTE — Telephone Encounter (Signed)
Rn called pt for second time today and got no answer. Voicemail was left with call back details. RN was attempting to reach pt after he missed treatment on Monday.

## 2022-12-17 NOTE — Progress Notes (Signed)
Patient did not show up for nutrition appointment.

## 2022-12-17 NOTE — Telephone Encounter (Signed)
Rn was unable to reach pt and left message with call back details. RN was reaching out to pt after he has missed several treatments. RN will attempt to call back pt later.

## 2022-12-18 ENCOUNTER — Telehealth: Payer: Self-pay

## 2022-12-18 ENCOUNTER — Ambulatory Visit: Payer: Medicaid Other

## 2022-12-18 NOTE — Telephone Encounter (Signed)
Rn called pt to inform him of Dr. Colletta Maryland plan for resuming his treatment on 12-30-22. Rn let him know new planning will need to take place due to his missed treatments. Pt was ok with this plan and plans on being here on 12-30-22.
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
## 2022-12-19 ENCOUNTER — Ambulatory Visit: Payer: Medicaid Other

## 2022-12-20 ENCOUNTER — Ambulatory Visit: Payer: Medicaid Other

## 2022-12-23 ENCOUNTER — Ambulatory Visit: Payer: Medicaid Other

## 2022-12-24 ENCOUNTER — Ambulatory Visit: Payer: Medicaid Other

## 2022-12-24 ENCOUNTER — Inpatient Hospital Stay: Payer: Medicaid Other | Admitting: Nutrition

## 2022-12-24 ENCOUNTER — Encounter: Payer: Self-pay | Admitting: Nutrition

## 2022-12-24 DIAGNOSIS — C109 Malignant neoplasm of oropharynx, unspecified: Secondary | ICD-10-CM | POA: Diagnosis present

## 2022-12-24 NOTE — Progress Notes (Signed)
Patient scheduled for nutrition consult. He did not show up or cancel this appointment.

## 2022-12-25 ENCOUNTER — Ambulatory Visit: Payer: Medicaid Other

## 2022-12-25 NOTE — Progress Notes (Deleted)
Palliative Medicine [facility]  Telephone:(336) [phone] Fax:(336) [phone]   Name: Glen WAX Sr. Date: 12/25/2022 MRN: [id number]  DOB: 1965/08/01  Patient Care Team: Patient, No Pcp Per as PCP - General (General Practice) Pickenpack-Cousar, Arty Baumgartner, NP as Nurse Practitioner (Hospice and Palliative Medicine) Rachel Moulds, MD as Consulting Physician (Hematology and Oncology) Lonie Peak, MD as Consulting Physician (Radiation Oncology) Malmfelt, Lise Auer, RN as Oncology Nurse Navigator Thersa Salt, MD (Otolaryngology)    INTERVAL HISTORY: Glen TRUNDY Sr. is a 57 y.o. 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 Marlou Porch, and the patient is legally blind. Palliative ask to see for symptom management and goals of care.     SOCIAL HISTORY:     reports that he has been smoking cigarettes. He has never used smokeless tobacco. He reports that he does not drink alcohol and does not use drugs.  ADVANCE DIRECTIVES:  None on file  CODE STATUS: Full code  PAST MEDICAL HISTORY: Past Medical History:  Diagnosis Date  . Chronic low back pain with sciatica   . History of kidney stones   . Large Right Oropharyngeal mass 07/17/2022   discomfort with swallowing, voice changes  . Legally blind    both eyes  . Ureter cancer, right [facility]) 12/2021   urologist--- dr Marlou Porch;   dx 12-12-2021  s/p  ureteral bx ,  noninvasive low grade papillary urothelial cnacer    ALLERGIES:  is allergic to other and sulfa antibiotics.  MEDICATIONS:  Current Outpatient Medications  Medication Sig Dispense Refill  . diazepam (VALIUM) 5 MG tablet Take 1 tablet (5 mg total) by mouth every 8 (eight) hours as needed for anxiety. 30 tablet 0  . latanoprost (XALATAN) 0.005 % ophthalmic solution Place 1 drop into the left eye at  bedtime.    . lidocaine (XYLOCAINE) 2 % solution Patient: Mix 1part 2% viscous lidocaine, 1part H20. Swallow 10mL of diluted mixture, before meals and at bedtime, up to QID 200 mL 3  . lidocaine-prilocaine (EMLA) cream Apply to affected area once (Patient not taking: Reported on 11/01/2022) 30 g 3  . nicotine (NICODERM CQ - DOSED IN MG/24 HOURS) 14 mg/24hr patch Place 1 patch (14 mg total) onto the skin daily. Apply 21 mg patch daily x 6 wk, then 14mg  patch daily x 2 wk, then 7 mg patch daily x 2 wk (Patient not taking: Reported on 11/01/2022) 14 patch 0  . nicotine (NICODERM CQ - DOSED IN MG/24 HOURS) 21 mg/24hr patch Place 1 patch (21 mg total) onto the skin daily. Apply 21 mg patch daily x 6 wk, then 14mg  patch daily x 2 wk, then 7 mg patch daily x 2 wk 14 patch 2  . nicotine (NICODERM CQ - DOSED IN MG/24 HR) 7 mg/24hr patch Place 1 patch (7 mg total) onto the skin daily. Apply 21 mg patch daily x 6 wk, then 14mg  patch daily x 2 wk, then 7 mg patch daily x 2 wk (Patient not taking: Reported on 11/01/2022) 14 patch 0  . oxybutynin (DITROPAN-XL) 10 MG 24 hr tablet Take 10 mg by mouth daily.    . phenazopyridine (PYRIDIUM) 100 MG tablet Take 2 tablets (200 mg total) by mouth 3 (three) times daily as needed for pain. (Patient not taking: Reported on 08/07/2022) 20 tablet 0  . Polyvinyl Alcohol (  LIQUID TEARS OP) Place 3 drops into the right eye daily as needed (dryness).    . prochlorperazine (COMPAZINE) 10 MG tablet Take 1 tablet (10 mg total) by mouth every 6 (six) hours as needed (Nausea or vomiting). 30 tablet 1  . timolol (TIMOPTIC) 0.25 % ophthalmic solution Place 1 drop into the left eye daily.     No current facility-administered medications for this visit.    VITAL SIGNS: There were no vitals taken for this visit. There were no vitals filed for this visit.  Estimated body mass index is 30.71 kg/m as calculated from the following:   Height as of 12/02/22: 5\' 8"  (1.727 m).   Weight as of  12/02/22: 202 lb (91.6 kg).   PERFORMANCE STATUS (ECOG) : 1 - Symptomatic but completely ambulatory   Physical Exam General: NAD Cardiovascular: regular rate and rhythm Pulmonary: clear ant fields Abdomen: soft, nontender, + bowel sounds Extremities: no edema, no joint deformities Skin: no rashes Neurological: AAOx3  IMPRESSION:   Throat Pain/Discomfort Glen Mcmillan reports occasional throat pain and discomfort however feels this is manageable.  Does not require daily medication use for pain.  He understands we will continue to closely monitor and support as needed.  He is much appreciative that he is not having much discomfort in the way of pain at this time compared to months prior.    Anxiety   Goals of Care  10/25/22- We discussed his current illness and what it means in the larger context of his on-going co-morbidities. Natural disease trajectory and expectations were discussed.   Glen Mcmillan understanding of current diagnosis and treatment plan. He is prepared to begin radiation treatments on next week. He is somewhat anxious about the fear of the unknown. I created space and opportunity allowing him to express his thoughts and feelings. He speaks to fear of his inability to eat and possibility of needing a feeding tube. Emotional support provided. We discussed the use of artificial feedings and feeding tube placement in the setting of cancer. He verbalized understanding however expresses at this time he is against placement of a feeding tube unless he does not have another option. Shares he plans to make every effort to support his nutritional needs making any necessary adjustments including the use of liquids and baby foods. If he reaches a point he cannot meet his nutritional needs or take anything by mouth and has to make a life dependent decision he would then choose to proceed with a feeding tube to sustain his life and opportunity to continue thriving.    Story states he  is not "100% sold on starting chemotherapy". He is meeting with his Glen Mcmillan and a spiritual leader which he shares they plan to discuss his health over the weekend. He plans to seek answers and guidance in decisions through prayer. Support provided. I inquired regarding questions or worries in regards to him pursuing treatment. Glen Mcmillan states he becomes anxious and overwhelmed when to many things happen all at once. He prefers for things to happen in phases and steps "one at a time". He shares his mother's journey with breast cancer and living to the age of 91. Speaking of her strength and knowing if she can go through treatments he must also show his strength. I recommended continuing discussions with his Oncology team at upcoming appointment on Monday also sharing that in some cancers to allow for best outcomes chemoradiation simultaneously are recommended. He verbalized understanding.    We discussed importance of expressing  his feelings. Glen Mcmillan knows that he has a team that is here to support him including palliative and our social work team. Both teams are readily available to allow him to openly discuss his thoughts and feelings allowing him to navigate through this journey in support of his Oncologist/Radiation Oncologist. We discussed ways to cope and manage any anxiety and fears that he may have. He verbalized understanding and appreciation.    We discussed Her current illness and what it means in the larger context of Her on-going co-morbidities. Natural disease trajectory and expectations were discussed.  I discussed the importance of continued conversation with family and their medical providers regarding overall plan of care and treatment options, ensuring decisions are within the context of the patients values and GOCs.  PLAN: Valium 5 mg every 12 hours as needed for anxiety Patient declines further pain management. Endorses some throat discomfort however feels this is managed with  occasional use of lidocaine solution as needed. Is not interested in opioid use at this time.  Extensive goals of care discussions. Patient is clear in expressed wishes to treat the treatable allowing him every opportunity to continue thriving. Somewhat anxious about the fear of the unknowns related to treatments. I will plan to see patient back in 1-2 weeks in collaboration to other oncology appointments.    Patient expressed understanding and was in agreement with this plan. He also understands that He can call the clinic at any time with any questions, concerns, or complaints.   Any controlled substances utilized were prescribed in the context of palliative care. PDMP has been reviewed.    Visit consisted of counseling and education dealing with the complex and emotionally intense issues of symptom management and palliative care in the setting of serious and potentially life-threatening illness.Greater than 50%  of this time was spent counseling and coordinating care related to the above assessment and plan.  Willette Alma, AGPCNP-BC  Palliative Medicine Team/[facility]  *Please note that this is a verbal dictation therefore any spelling or grammatical errors are due to the "Dragon Medical One" system interpretation.

## 2022-12-26 ENCOUNTER — Inpatient Hospital Stay: Payer: Medicaid Other | Admitting: Nurse Practitioner

## 2022-12-26 ENCOUNTER — Ambulatory Visit: Payer: Medicaid Other

## 2022-12-27 ENCOUNTER — Ambulatory Visit: Payer: Medicaid Other

## 2022-12-27 ENCOUNTER — Telehealth: Payer: Self-pay

## 2022-12-27 NOTE — Telephone Encounter (Signed)
Rn attempted to call pt and ensure he knew what time to be here on Monday to restart his treatment and to let him know we will have rides arranged for next week. Rn was unable to leave message for pt due to no voicemail being available.

## 2022-12-30 ENCOUNTER — Telehealth: Payer: Self-pay

## 2022-12-30 ENCOUNTER — Ambulatory Visit: Payer: Medicaid Other | Admitting: Radiation Oncology

## 2022-12-30 ENCOUNTER — Ambulatory Visit: Payer: Medicaid Other

## 2022-12-30 NOTE — Telephone Encounter (Signed)
RN called pt after failed attempt to reach pt, after he missed his radiation appointment. Rn will notify machine of this attempt,

## 2022-12-31 ENCOUNTER — Encounter: Payer: Self-pay | Admitting: Nutrition

## 2022-12-31 ENCOUNTER — Inpatient Hospital Stay: Payer: Medicaid Other | Admitting: Nutrition

## 2022-12-31 ENCOUNTER — Ambulatory Visit: Payer: Medicaid Other

## 2022-12-31 NOTE — Progress Notes (Signed)
Patient did not show for nutrition appointment. 

## 2022-12-31 NOTE — Progress Notes (Signed)
Oncology Nurse Navigator Documentation   Mr. Hoard did not come for his planned restart of radiation yesterday. The [facility] and Zerita Boers RN attempted to call him but were unable to reach him. Dr. 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 I called him today but he did not answer and I was unable to leave a VM. I also attempted to text him to ask him to call my office number but he has not responded yet. I did let him know in the text that he will need to contact me by Thursday and if he did not his radiation appointments will be cancelled to allow space for other patients.  I will continue to call him twice daily until Thursday. I provided him with my direct office number and asked that he return my call as soon as possible in my text.   Hedda Slade RN, BSN, OCN Head & Neck Oncology Nurse Navigator [facility] at [facility] Phone # [phone]  Fax # [phone]

## 2023-01-01 ENCOUNTER — Ambulatory Visit: Payer: Medicaid Other

## 2023-01-01 NOTE — Progress Notes (Signed)
Oncology Nurse Navigator Documentation   I have attempted to call Mr. Brock on his cell phone to encourage him to come in for his scheduled radiation treatment. He did not answer the phone and I was unable to leave a VM because it has not been set up. I then attempted to call his emergency contact Al Decant but he did not answer the phone and I was unable to leave a VM.   I will try again tomorrow if he does not show for his treatment today.   Hedda Slade RN, BSN, OCN Head & Neck Oncology Nurse Navigator [facility] at [facility] Phone # [phone]  Fax # [phone]

## 2023-01-02 ENCOUNTER — Ambulatory Visit: Payer: Medicaid Other

## 2023-01-02 NOTE — Progress Notes (Signed)
Oncology Nurse Navigator Documentation   I have attempted to call Mr. Mwangi again today to let him know that Dr. Basilio Cairo and our team have scheduled him for 5 final treatments to treat his tonsil cancer to avoid progression of his cancer. I was unable to leave a VM due to his VM being full. I have notified Dr. Basilio Cairo, the [facility], and Zerita Boers RN. Mr. Yack has called me in the past on my direct line and is able to call me if he has any questions about the plan for his treatment.   Hedda Slade RN, BSN, OCN Head & Neck Oncology Nurse Navigator [facility] at [facility] Phone # [phone]  Fax # [phone]

## 2023-01-03 ENCOUNTER — Ambulatory Visit: Payer: Medicaid Other

## 2023-01-03 NOTE — Progress Notes (Signed)
Oncology Nurse Navigator Documentation   Mr. Settles has not contacted our office regarding his missed radiation appointments since 12/02/22. He spoke with Zerita Boers RN when she called him and had agreed to come in and talk to Dr. Basilio Cairo on 10/28 about completing radiation for his head and neck cancer. He did not show for that appointment and despite daily/twice daily attempts to reach him and his emergency contact we have not been able to contact him due to his VM being full and him not answering his phone. At this time his radiation appointments have been cancelled but we will hold his mask for one month if at some point he contacts [place]. I have notified his referring ENT, Dr. Hezzie Bump of the above as well.   Hedda Slade RN, BSN, OCN Head & Neck Oncology Nurse Navigator [facility] at [facility] Phone # [phone]  Fax # [phone]

## 2023-01-06 ENCOUNTER — Ambulatory Visit: Payer: Medicaid Other

## 2023-01-06 NOTE — Radiation Completion Notes (Signed)
Patient Name: Glen Mcmillan, Glen Mcmillan MRN: [id number] Date of Birth: November 23, 1965 Referring Physician: Corey Skains, M.D. Date of Service: 2023-01-06 Radiation Oncologist: Lonie Peak, M.D. 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, M=cM0 Intent: Curative     ==========DELIVERED PLANS==========  First Treatment Date: 2022-11-19 - Last Treatment Date: 2022-12-02   Plan Name: HN_R_Tonsil Site: Tonsil, Right Technique: IMRT Mode: Photon Dose Per Fraction: 2 Gy Prescribed Dose (Delivered / Prescribed): 18 Gy / 18 Gy Prescribed Fxs (Delivered / Prescribed): 9 / 9     ==========ON TREATMENT VISIT DATES========== 2022-11-22, 2022-11-25, 2022-12-02     ==========UPCOMING VISITS========== 2024-11-08T16:40:00Z [place] PERIOPERATIVE ARE[facility] Ambulatory Surgery [name], MD; [name], MD        ==========APPENDIX - ON TREATMENT VISIT NOTES==========   See weekly On Treatment Notes in Epic for details.

## 2023-01-07 ENCOUNTER — Ambulatory Visit: Payer: Medicaid Other

## 2023-01-08 ENCOUNTER — Ambulatory Visit: Payer: Medicaid Other

## 2023-01-09 ENCOUNTER — Encounter (HOSPITAL_COMMUNITY)
Admission: RE | Admit: 2023-01-09 | Discharge: 2023-01-09 | Disposition: A | Discharge status: Home / Self Care | Payer: Medicaid Other | Source: Ambulatory Visit | Attending: Urology | Admitting: Urology

## 2023-01-09 ENCOUNTER — Other Ambulatory Visit: Payer: Self-pay

## 2023-01-09 ENCOUNTER — Ambulatory Visit: Payer: Medicaid Other

## 2023-01-09 ENCOUNTER — Encounter (HOSPITAL_COMMUNITY): Payer: Self-pay

## 2023-01-09 VITALS — BP 122/73 | HR 80 | Temp 98.5°F | Resp 16 | Ht 68.0 in | Wt 202.0 lb

## 2023-01-09 DIAGNOSIS — Z01818 Encounter for other preprocedural examination: Secondary | ICD-10-CM | POA: Diagnosis present

## 2023-01-09 DIAGNOSIS — E876 Hypokalemia: Secondary | ICD-10-CM

## 2023-01-09 LAB — CBC
HCT: 43 % (ref 39.0–52.0)
Hemoglobin: 14.4 g/dL (ref 13.0–17.0)
MCH: 31.2 pg (ref 26.0–34.0)
MCHC: 33.5 g/dL (ref 30.0–36.0)
MCV: 93.3 fL (ref 80.0–100.0)
Platelets: 206 10*3/uL (ref 150–400)
RBC: 4.61 MIL/uL (ref 4.22–5.81)
RDW: 14.5 % (ref 11.5–15.5)
WBC: 4.2 10*3/uL (ref 4.0–10.5)
nRBC: 0 % (ref 0.0–0.2)

## 2023-01-09 LAB — BASIC METABOLIC PANEL
Anion gap: 10 (ref 5–15)
BUN: 14 mg/dL (ref 6–20)
CO2: 26 mmol/L (ref 22–32)
Calcium: 8.8 mg/dL — ABNORMAL LOW (ref 8.9–10.3)
Chloride: 100 mmol/L (ref 98–111)
Creatinine, Ser: 0.88 mg/dL (ref 0.61–1.24)
GFR, Estimated: >60 mL/min (ref >60)
Glucose, Bld: 169 mg/dL — ABNORMAL HIGH (ref 70–99)
Potassium: 3.8 mmol/L (ref 3.5–5.1)
Sodium: 136 mmol/L (ref 135–145)

## 2023-01-09 NOTE — Patient Instructions (Signed)
SURGICAL WAITING ROOM VISITATION  Patients having surgery or a procedure may have no more than 2 support people in the waiting area - these visitors may rotate.    Children under the age of 72 must have an adult with them who is not the patient.  Due to an increase in RSV and influenza rates and associated hospitalizations, children ages 76 and under may not visit patients in [facility] hospitals.  If the patient needs to stay at the hospital during part of their recovery, the visitor guidelines for inpatient rooms apply. Pre-op nurse will coordinate an appropriate time for 1 support person to accompany patient in pre-op.  This support person may not rotate.    Please refer to the [facility] website for the visitor guidelines for Inpatients (after your surgery is over and you are in a regular room).       Your procedure is scheduled on:    Report to [facility] Main Entrance    Report to admitting at AM   Call this number if you have problems the morning of surgery [phone]   Do not eat food or drink liquids :After Midnight.                If you have questions, please contact your surgeon's office.   FOLLOW  ANY ADDITIONAL PRE OP INSTRUCTIONS YOU RECEIVED FROM YOUR SURGEON'S OFFICE!!!     Oral Hygiene is also important to reduce your risk of infection.                                    Remember - BRUSH YOUR TEETH THE MORNING OF SURGERY WITH YOUR REGULAR TOOTHPASTE  DENTURES WILL BE REMOVED PRIOR TO SURGERY PLEASE DO NOT APPLY "Poly grip" OR ADHESIVES!!!   Do NOT smoke after Midnight   Stop all vitamins and herbal supplements 7 days before surgery.   Take these medicines the morning of surgery with A SIP OF WATER:   DO NOT TAKE ANY ORAL DIABETIC MEDICATIONS DAY OF YOUR SURGERY  Bring CPAP mask and tubing day of surgery.                              You may not have any metal on your body including hair pins, jewelry, and body piercing             Do  not wear  lotions, powders, perfumes/cologne, or deodorant               Men may shave face and neck.   Do not bring valuables to the hospital. [facility] IS NOT             RESPONSIBLE   FOR VALUABLES.   Contacts, glasses, dentures or bridgework may not be worn into surgery.   Bring small overnight bag day of surgery.   DO NOT BRING YOUR HOME MEDICATIONS TO THE HOSPITAL. PHARMACY WILL DISPENSE MEDICATIONS LISTED ON YOUR MEDICATION LIST TO YOU DURING YOUR ADMISSION IN THE HOSPITAL!    Patients discharged on the day of surgery will not be allowed to drive home.  Someone NEEDS to stay with you for the first 24 hours after anesthesia.   Special Instructions: Bring a copy of your healthcare power of attorney and living will documents the day of surgery if you haven't scanned them before.  Please read over the following fact sheets you were given: IF YOU HAVE QUESTIONS ABOUT YOUR PRE-OP INSTRUCTIONS PLEASE CALL [phone]   If you test positive for Covid or have been in contact with anyone that has tested positive in the last 10 days please notify you surgeon.    [place] - Preparing for Surgery Before surgery, you can play an important role.  Because skin is not sterile, your skin needs to be as free of germs as possible.  You can reduce the number of germs on your skin by washing with CHG (chlorahexidine gluconate) soap before surgery.  CHG is an antiseptic cleaner which kills germs and bonds with the skin to continue killing germs even after washing. Please DO NOT use if you have an allergy to CHG or antibacterial soaps.  If your skin becomes reddened/irritated stop using the CHG and inform your nurse when you arrive at Short Stay. Do not shave (including legs and underarms) for at least 48 hours prior to the first CHG shower.  You may shave your face/neck. Please follow these instructions carefully:  1.  Shower with CHG Soap the night before surgery and the  morning of  Surgery.  2.  If you choose to wash your hair, wash your hair first as usual with your  normal  shampoo.  3.  After you shampoo, rinse your hair and body thoroughly to remove the  shampoo.                           4.  Use CHG as you would any other liquid soap.  You can apply chg directly  to the skin and wash                       Gently with a scrungie or clean washcloth.  5.  Apply the CHG Soap to your body ONLY FROM THE NECK DOWN.   Do not use on face/ open                           Wound or open sores. Avoid contact with eyes, ears mouth and genitals (private parts).                       Wash face,  Genitals (private parts) with your normal soap.             6.  Wash thoroughly, paying special attention to the area where your surgery  will be performed.  7.  Thoroughly rinse your body with warm water from the neck down.  8.  DO NOT shower/wash with your normal soap after using and rinsing off  the CHG Soap.                9.  Pat yourself dry with a clean towel.            10.  Wear clean pajamas.            11.  Place clean sheets on your bed the night of your first shower and do not  sleep with pets. Day of Surgery : Do not apply any lotions/deodorants the morning of surgery.  Please wear clean clothes to the hospital/surgery center.  FAILURE TO FOLLOW THESE INSTRUCTIONS MAY RESULT IN THE CANCELLATION OF YOUR SURGERY PATIENT SIGNATURE_________________________________  NURSE SIGNATURE__________________________________  ________________________________________________________________________

## 2023-01-09 NOTE — Progress Notes (Addendum)
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 Patient agrees to go over the instructions while at home for a better understanding. Patient also instructed to self quarantine after being tested for COVID-19. The opportunity to ask questions was provided.

## 2023-01-10 ENCOUNTER — Observation Stay (HOSPITAL_COMMUNITY)
Admission: RE | Admit: 2023-01-10 | Discharge: 2023-01-11 | Disposition: A | Discharge status: Home / Self Care | Payer: Medicaid Other | Attending: Urology | Admitting: Urology

## 2023-01-10 ENCOUNTER — Other Ambulatory Visit: Payer: Self-pay

## 2023-01-10 ENCOUNTER — Encounter (HOSPITAL_COMMUNITY): Payer: Self-pay | Admitting: Urology

## 2023-01-10 ENCOUNTER — Encounter (HOSPITAL_COMMUNITY)
Admission: RE | Disposition: A | Discharge status: Home / Self Care | Payer: Self-pay | Source: Home / Self Care | Attending: Urology

## 2023-01-10 ENCOUNTER — Ambulatory Visit: Payer: Medicaid Other

## 2023-01-10 ENCOUNTER — Ambulatory Visit (HOSPITAL_BASED_OUTPATIENT_CLINIC_OR_DEPARTMENT_OTHER): Payer: Medicaid Other | Admitting: Anesthesiology

## 2023-01-10 ENCOUNTER — Ambulatory Visit (HOSPITAL_COMMUNITY): Payer: Medicaid Other

## 2023-01-10 ENCOUNTER — Ambulatory Visit (HOSPITAL_COMMUNITY): Payer: Medicaid Other | Admitting: Physician Assistant

## 2023-01-10 DIAGNOSIS — C661 Malignant neoplasm of right ureter: Principal | ICD-10-CM | POA: Diagnosis present

## 2023-01-10 DIAGNOSIS — N451 Epididymitis: Secondary | ICD-10-CM | POA: Diagnosis not present

## 2023-01-10 HISTORY — PX: CYSTOSCOPY WITH STENT PLACEMENT: SHX5790

## 2023-01-10 SURGERY — CYSTOSCOPY, WITH STENT INSERTION
Anesthesia: General | Site: Ureter | Laterality: Right

## 2023-01-10 MED ORDER — CIPROFLOXACIN IN D5W 400 MG/200ML IV SOLN
400.0000 mg | INTRAVENOUS | Status: AC
  Administered 2023-01-10: 400 mg via INTRAVENOUS
  Filled 2023-01-10: qty 200

## 2023-01-10 MED ORDER — TRAMADOL HCL 50 MG PO TABS
50.0000 mg | ORAL_TABLET | Freq: Four times a day (QID) | ORAL | Status: DC | PRN
  Administered 2023-01-10 – 2023-01-11 (×2): 100 mg via ORAL
  Filled 2023-01-10 (×2): qty 2

## 2023-01-10 MED ORDER — SODIUM CHLORIDE 0.9 % IR SOLN
Status: DC | PRN
  Administered 2023-01-10: 3000 mL via INTRAVESICAL

## 2023-01-10 MED ORDER — LIDOCAINE HCL (CARDIAC) PF 100 MG/5ML IV SOSY
PREFILLED_SYRINGE | INTRAVENOUS | Status: DC | PRN
  Administered 2023-01-10: 100 mg via INTRAVENOUS

## 2023-01-10 MED ORDER — DEXAMETHASONE SODIUM PHOSPHATE 10 MG/ML IJ SOLN
INTRAMUSCULAR | Status: DC | PRN
  Administered 2023-01-10: 5 mg via INTRAVENOUS

## 2023-01-10 MED ORDER — FENTANYL CITRATE PF 50 MCG/ML IJ SOSY: 25.0000 ug | PREFILLED_SYRINGE | INTRAMUSCULAR | Status: DC | PRN

## 2023-01-10 MED ORDER — PROPOFOL 10 MG/ML IV BOLUS
INTRAVENOUS | Status: DC | PRN
  Administered 2023-01-10: 170 mg via INTRAVENOUS

## 2023-01-10 MED ORDER — ROCURONIUM BROMIDE 100 MG/10ML IV SOLN
INTRAVENOUS | Status: DC | PRN
  Administered 2023-01-10: 50 mg via INTRAVENOUS
  Administered 2023-01-10: 10 mg via INTRAVENOUS

## 2023-01-10 MED ORDER — PHENAZOPYRIDINE HCL 200 MG PO TABS: 200.0000 mg | ORAL_TABLET | Freq: Three times a day (TID) | ORAL | 0 refills | Status: DC | PRN

## 2023-01-10 MED ORDER — LIDOCAINE HCL URETHRAL/MUCOSAL 2 % EX GEL
CUTANEOUS | Status: DC | PRN
  Administered 2023-01-10: 1 via URETHRAL

## 2023-01-10 MED ORDER — BRIMONIDINE TARTRATE-TIMOLOL 0.2-0.5 % OP SOLN: 1.0000 [drp] | Freq: Two times a day (BID) | OPHTHALMIC | Status: DC

## 2023-01-10 MED ORDER — LACTATED RINGERS IV SOLN: INTRAVENOUS | Status: DC

## 2023-01-10 MED ORDER — SUGAMMADEX SODIUM 200 MG/2ML IV SOLN
INTRAVENOUS | Status: DC | PRN
  Administered 2023-01-10: 200 mg via INTRAVENOUS

## 2023-01-10 MED ORDER — MIDAZOLAM HCL 5 MG/5ML IJ SOLN
INTRAMUSCULAR | Status: DC | PRN
  Administered 2023-01-10 (×2): 1 mg via INTRAVENOUS

## 2023-01-10 MED ORDER — OXYCODONE HCL 5 MG/5ML PO SOLN: 5.0000 mg | Freq: Once | ORAL | Status: DC | PRN

## 2023-01-10 MED ORDER — LIDOCAINE HCL URETHRAL/MUCOSAL 2 % EX GEL
CUTANEOUS | Status: AC
  Filled 2023-01-10: qty 30

## 2023-01-10 MED ORDER — ROCURONIUM BROMIDE 10 MG/ML (PF) SYRINGE
PREFILLED_SYRINGE | INTRAVENOUS | Status: AC
  Filled 2023-01-10: qty 10

## 2023-01-10 MED ORDER — LIDOCAINE HCL (PF) 2 % IJ SOLN
INTRAMUSCULAR | Status: AC
  Filled 2023-01-10: qty 5

## 2023-01-10 MED ORDER — STERILE WATER FOR IRRIGATION IR SOLN
Status: DC | PRN
  Administered 2023-01-10: 3000 mL via INTRAVESICAL

## 2023-01-10 MED ORDER — DEXMEDETOMIDINE HCL IN NACL 80 MCG/20ML IV SOLN
INTRAVENOUS | Status: DC | PRN
  Administered 2023-01-10: 8 ug via INTRAVENOUS

## 2023-01-10 MED ORDER — CHLORHEXIDINE GLUCONATE 0.12 % MT SOLN
15.0000 mL | Freq: Once | OROMUCOSAL | Status: AC
  Administered 2023-01-10: 15 mL via OROMUCOSAL

## 2023-01-10 MED ORDER — PHENYLEPHRINE HCL (PRESSORS) 10 MG/ML IV SOLN
INTRAVENOUS | Status: DC | PRN
  Administered 2023-01-10: 80 ug via INTRAVENOUS

## 2023-01-10 MED ORDER — BRIMONIDINE TARTRATE 0.2 % OP SOLN
1.0000 [drp] | Freq: Two times a day (BID) | OPHTHALMIC | Status: DC
  Administered 2023-01-10 – 2023-01-11 (×2): 1 [drp] via OPHTHALMIC
  Filled 2023-01-10: qty 5

## 2023-01-10 MED ORDER — MIDAZOLAM HCL 2 MG/2ML IJ SOLN
INTRAMUSCULAR | Status: AC
  Filled 2023-01-10: qty 2

## 2023-01-10 MED ORDER — TRAMADOL HCL 50 MG PO TABS: 50.0000 mg | ORAL_TABLET | Freq: Four times a day (QID) | ORAL | 0 refills | Status: DC | PRN

## 2023-01-10 MED ORDER — NICOTINE 21 MG/24HR TD PT24
21.0000 mg | MEDICATED_PATCH | Freq: Every day | TRANSDERMAL | Status: DC
  Administered 2023-01-10 – 2023-01-11 (×2): 21 mg via TRANSDERMAL
  Filled 2023-01-10 (×2): qty 1

## 2023-01-10 MED ORDER — ONDANSETRON HCL 4 MG/2ML IJ SOLN
INTRAMUSCULAR | Status: AC
Start: 2023-01-10 — End: ?
  Filled 2023-01-10: qty 2

## 2023-01-10 MED ORDER — DEXAMETHASONE SODIUM PHOSPHATE 10 MG/ML IJ SOLN
INTRAMUSCULAR | Status: AC
  Filled 2023-01-10: qty 1

## 2023-01-10 MED ORDER — FENTANYL CITRATE (PF) 100 MCG/2ML IJ SOLN
INTRAMUSCULAR | Status: AC
  Filled 2023-01-10: qty 2

## 2023-01-10 MED ORDER — CIPROFLOXACIN IN D5W 400 MG/200ML IV SOLN: 400.0000 mg | INTRAVENOUS | Status: DC

## 2023-01-10 MED ORDER — HYDROMORPHONE HCL 1 MG/ML IJ SOLN
0.5000 mg | Freq: Once | INTRAMUSCULAR | Status: AC
  Administered 2023-01-10: 0.5 mg via INTRAVENOUS
  Filled 2023-01-10: qty 0.5

## 2023-01-10 MED ORDER — PHENYLEPHRINE 80 MCG/ML (10ML) SYRINGE FOR IV PUSH (FOR BLOOD PRESSURE SUPPORT)
PREFILLED_SYRINGE | INTRAVENOUS | Status: AC
  Filled 2023-01-10: qty 10

## 2023-01-10 MED ORDER — ONDANSETRON HCL 4 MG/2ML IJ SOLN
INTRAMUSCULAR | Status: DC | PRN
  Administered 2023-01-10: 4 mg via INTRAVENOUS

## 2023-01-10 MED ORDER — ORAL CARE MOUTH RINSE: 15.0000 mL | Freq: Once | OROMUCOSAL | Status: AC

## 2023-01-10 MED ORDER — TIMOLOL MALEATE 0.25 % OP SOLN: 1.0000 [drp] | Freq: Every day | OPHTHALMIC | Status: DC

## 2023-01-10 MED ORDER — DORZOLAMIDE HCL 2 % OP SOLN
1.0000 [drp] | Freq: Three times a day (TID) | OPHTHALMIC | Status: DC
  Administered 2023-01-10 – 2023-01-11 (×2): 1 [drp] via OPHTHALMIC
  Filled 2023-01-10: qty 10

## 2023-01-10 MED ORDER — BRIMONIDINE TARTRATE 0.2 % OP SOLN: 1.0000 [drp] | Freq: Three times a day (TID) | OPHTHALMIC | Status: DC

## 2023-01-10 MED ORDER — IOHEXOL 300 MG/ML  SOLN
INTRAMUSCULAR | Status: DC | PRN
  Administered 2023-01-10: 30 mL

## 2023-01-10 MED ORDER — AMISULPRIDE (ANTIEMETIC) 5 MG/2ML IV SOLN: 10.0000 mg | Freq: Once | INTRAVENOUS | Status: DC | PRN

## 2023-01-10 MED ORDER — FENTANYL CITRATE (PF) 100 MCG/2ML IJ SOLN
INTRAMUSCULAR | Status: DC | PRN
  Administered 2023-01-10 (×2): 25 ug via INTRAVENOUS
  Administered 2023-01-10: 50 ug via INTRAVENOUS
  Administered 2023-01-10: 25 ug via INTRAVENOUS

## 2023-01-10 MED ORDER — OXYCODONE HCL 5 MG PO TABS: 5.0000 mg | ORAL_TABLET | Freq: Once | ORAL | Status: DC | PRN

## 2023-01-10 MED ORDER — TIMOLOL MALEATE 0.5 % OP SOLN
1.0000 [drp] | Freq: Two times a day (BID) | OPHTHALMIC | Status: DC
  Administered 2023-01-10 – 2023-01-11 (×2): 1 [drp] via OPHTHALMIC
  Filled 2023-01-10: qty 5

## 2023-01-10 MED ORDER — LATANOPROST 0.005 % OP SOLN
1.0000 [drp] | Freq: Every day | OPHTHALMIC | Status: DC
  Administered 2023-01-10: 1 [drp] via OPHTHALMIC
  Filled 2023-01-10: qty 2.5

## 2023-01-10 SURGICAL SUPPLY — 15 items
BAG URO CATCHER STRL LF (MISCELLANEOUS) ×1 IMPLANT
BASKET STONE 1.7 NGAGE (UROLOGICAL SUPPLIES) IMPLANT
CATH URETL OPEN 5X70 (CATHETERS) ×1 IMPLANT
CLOTH BEACON ORANGE TIMEOUT ST (SAFETY) ×1 IMPLANT
GLOVE SURG LX STRL 7.5 STRW (GLOVE) ×1 IMPLANT
GOWN STRL REUS W/ TWL XL LVL3 (GOWN DISPOSABLE) ×1 IMPLANT
GOWN STRL REUS W/TWL XL LVL3 (GOWN DISPOSABLE) ×1
GUIDEWIRE STR DUAL SENSOR (WIRE) ×1 IMPLANT
KIT BALLN UROMAX 15FX4 (MISCELLANEOUS) IMPLANT
KIT TURNOVER KIT A (KITS) IMPLANT
MANIFOLD NEPTUNE II (INSTRUMENTS) ×1 IMPLANT
PACK CYSTO (CUSTOM PROCEDURE TRAY) ×1 IMPLANT
PAD PREP 24X48 CUFFED NSTRL (MISCELLANEOUS) ×1 IMPLANT
STENT URET 6FRX26 CONTOUR (STENTS) IMPLANT
TUBING CONNECTING 10 (TUBING) IMPLANT

## 2023-01-10 NOTE — Anesthesia Postprocedure Evaluation (Signed)
Anesthesia Post Note  Patient: [name] Sr.  Procedure(s) Performed: CYSTOSCOPY WITH RIGHT URETERAL TUMOR RESECTION AND RIGHT URETERAL STENT EXCHANGE; RIGHT URETERAL BIOPSY; URETHERAL BIOPSY; RIGHT URETERAL BALLOON DILATION; URETHERAL FULGARATION (Right: Ureter)     Patient location during evaluation: PACU Anesthesia Type: General Level of consciousness: awake and alert Pain management: pain level controlled Vital Signs Assessment: post-procedure vital signs reviewed and stable Respiratory status: spontaneous breathing, nonlabored ventilation, respiratory function stable and patient connected to nasal cannula oxygen Cardiovascular status: blood pressure returned to baseline and stable Postop Assessment: no apparent nausea or vomiting Anesthetic complications: yes   Encounter Notable Events  Notable Event Outcome Phase Comment  Difficult to intubate - expected  Intraprocedure Filed from anesthesia note documentation.    Last Vitals:  Vitals:   01/10/23 1448 01/10/23 1858  BP: (!) 131/90 127/83  Pulse: 70 (!) 102  Resp: (!) 22 20  Temp: 36.6 C (!) 36.3 C  SpO2: 98% 96%    Last Pain:  Vitals:   01/10/23 1955  TempSrc:   PainSc: 5                  [place]

## 2023-01-10 NOTE — Transfer of Care (Signed)
Immediate Anesthesia Transfer of Care Note  Patient: Glen Anis Sr.  Procedure(s) Performed: CYSTOSCOPY WITH RIGHT URETERAL TUMOR RESECTION AND RIGHT URETERAL STENT EXCHANGE; RIGHT URETERAL BIOPSY; URETHERAL BIOPSY; RIGHT URETERAL BALLOON DILATION; URETHERAL FULGARATION (Right: Ureter)  Patient Location: PACU  Anesthesia Type:General  Level of Consciousness: awake, alert , oriented, and patient cooperative  Airway & Oxygen Therapy: Patient Spontanous Breathing and Patient connected to face mask oxygen  Post-op Assessment: Report given to RN and Post -op Vital signs reviewed and stable  Post vital signs: Reviewed and stable  Last Vitals:  Vitals Value Taken Time  BP 127/81 01/10/23 1336  Temp    Pulse 71 01/10/23 1340  Resp 16 01/10/23 1340  SpO2 100 % 01/10/23 1340  Vitals shown include unfiled device data.  Last Pain:  Vitals:   01/10/23 1030  TempSrc: Oral  PainSc:       Patients Stated Pain Goal: 4 (01/10/23 1000)  Complications:  Encounter Notable Events  Notable Event Outcome Phase Comment  Difficult to intubate - expected  Intraprocedure Filed from anesthesia note documentation.

## 2023-01-10 NOTE — H&P (Signed)
57 year old man who initially presented with a left proximal ureteral stone and right-sided hydronephrosis. He was subsequently diagnosed with a right ureteral mass. 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 1 month later we went back to the operating room for a 1.5 x 1.5 cm with several tumors along the course of the ureter. The tumors were lasered off and then lassoed. Was difficult to know if they have been completely excised.   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 Patient scheduled to go radiation and chemotherapy for his tonsillar cancer with the expectation that he will be through his treatment early November.   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  6/24: Taken the operating room again for reevaluation of the left ureter which demonstrated a smaller lesion, but ever present. Both proximal and distal to this appeared to be uninvolved. He measured approximately 1 cm. Difficult to know whether that was completely excised, but it was treated with laser ablation.   Interval: Here today for follow-up of right testicular pain. The patient was seen in the emergency department 2 subsequent times within the last week. The initial [place] visit he was started on Levaquin and given pain medication as well as ibuprofen. He was released home and subsequently called 911 again because of the progressive pain. Ultrasound at that time demonstrated a small hydrocele but no other significant or concerning findings. He was discharged home again.   Today the patient states that he has had fever up to 101.5 over the past 24 hours. He is complaining of severe pain that is poorly controlled.     ALLERGIES: No Allergies    MEDICATIONS: Oxycodone Hcl 5 mg  capsule 1 capsule PO QID PRN  Ibuprofen 800 mg tablet  Levofloxacin 500 mg tablet  Oxybutynin Chloride Er 10 mg tablet, extended release 24 hr  Oxycodone Hcl 5 mg tablet 1 tab PO Q6 PRN post-op pain  Oxycodone-Acetaminophen 5 mg-325 mg tablet     GU PSH: Cysto Uretero Biopsy Fulgura - 01/09/2022 Cystoscopy Insert Stent - 01/09/2022     NON-GU PSH: No Non-GU PSH    GU PMH: Right ureteral cancer - 09/13/2022, - 08/01/2022, - 04/02/2022, - 01/31/2022 Ureteral calculus - 01/31/2022    NON-GU PMH: No Non-GU PMH    FAMILY HISTORY: No Family History    SOCIAL HISTORY: No Social History    REVIEW OF SYSTEMS:    GU Review Male:   Patient denies frequent urination, hard to postpone urination, burning/ pain with urination, get up at night to urinate, leakage of urine, stream starts and stops, trouble starting your stream, have to strain to urinate , erection problems, and penile pain.  Gastrointestinal (Upper):   Patient denies nausea, vomiting, and indigestion/ heartburn.  Gastrointestinal (Lower):   Patient denies diarrhea and constipation.  Constitutional:   Patient denies fever, night sweats, weight loss, and fatigue.  Skin:   Patient denies skin rash/ lesion and itching.  Eyes:   Patient denies blurred vision and double vision.  Ears/ Nose/ Throat:   Patient denies sore throat and sinus problems.  Hematologic/Lymphatic:   Patient denies swollen glands and easy bruising.  Cardiovascular:   Patient denies leg swelling and chest pains.  Respiratory:   Patient denies cough and shortness of breath.  Endocrine:   Patient denies excessive thirst.  Musculoskeletal:  Patient denies back pain and joint pain.  Neurological:   Patient denies headaches and dizziness.  Psychologic:   Patient denies depression and anxiety.   Notes: scrotal pain    VITAL SIGNS:      11/05/2022 03:14 PM  Weight 197 lb / 89.36 kg  BP 122/82 mmHg  Pulse 85 /min  Temperature 97.4 F / 36.3 C   GU PHYSICAL  EXAMINATION:      Notes: Minimal right hemiscrotal swelling with a fairly normal sized right testicle that is tender to palpation.     Complexity of Data:  Source Of History:  Patient  Records Review:   Previous Hospital Records, Previous Patient Records  Urine Test Review:   Urinalysis  X-Ray Review: Scrotal Ultrasound: Reviewed Films. Discussed With Patient.     PROCEDURES:          Urinalysis w/Scope Dipstick Dipstick Cont'd Micro  Color: Amber Bilirubin: Neg mg/dL WBC/hpf: 0 - 5/hpf  Appearance: Slightly Cloudy Ketones: Neg mg/dL RBC/hpf: 20 - 29/FAO  Specific Gravity: 1.020 Blood: 3+ ery/uL Bacteria: Few (10-25/hpf)  pH: 5.5 Protein: Trace mg/dL Cystals: NS (Not Seen)  Glucose: Neg mg/dL Urobilinogen: 0.2 mg/dL Casts: NS (Not Seen)    Nitrites: Neg Trichomonas: Not Present    Leukocyte Esterase: Neg leu/uL Mucous: Present      Epithelial Cells: 0 - 5/hpf      Yeast: NS (Not Seen)      Sperm: Not Present    ASSESSMENT:      ICD-10 Details  1 GU:   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 He has minimal right hemiscrotal swelling and his testicle is of normal size. I recommended that he complete the Levaquin prescription that has been given. I also strongly encouraged him to use the ibuprofen on a scheduled basis. Further, I recommended ice and scrotal elevation. I do anticipate the patient's pain improving over the course of the next several days.

## 2023-01-10 NOTE — Interval H&P Note (Signed)
History and Physical Interval Note:  01/10/2023 11:27 AM  Glen Anis Sr.  has presented today for surgery, with the diagnosis of RIGHT URETERAL CANCER.  The various methods of treatment have been discussed with the patient and family. After consideration of risks, benefits and other options for treatment, the patient has consented to  Procedure(s) with comments: CYSTOSCOPY WITH RIGHT URETERAL TUMOR RESECTION AND RIGHT URETERAL STENT EXCHANGE (Right) - 75 MINUTES as a surgical intervention.  [name] history has been reviewed, patient examined, no change in status, stable for surgery.  I have reviewed the patient's chart and labs.  Questions were answered to the patient's satisfaction.     [name]

## 2023-01-10 NOTE — Anesthesia Procedure Notes (Signed)
Procedure Name: Intubation Date/Time: 01/10/2023 11:57 AM  Performed by: Garth Bigness, CRNAPre-anesthesia Checklist: Patient identified, Emergency Drugs available, Suction available and Patient being monitored Patient Re-evaluated:Patient Re-evaluated prior to induction Oxygen Delivery Method: Circle system utilized Preoxygenation: Pre-oxygenation with 100% oxygen Induction Type: IV induction Ventilation: Mask ventilation without difficulty Laryngoscope Size: Glidescope Grade View: Grade I Tube type: Oral Tube size: 7.5 mm Number of attempts: 1 Airway Equipment and Method: Oral airway, Video-laryngoscopy and Rigid stylet Placement Confirmation: ETT inserted through vocal cords under direct vision, positive ETCO2 and breath sounds checked- equal and bilateral Secured at: 23 cm Tube secured with: Tape Dental Injury: Teeth and Oropharynx as per pre-operative assessment  Difficulty Due To: Difficulty was anticipated

## 2023-01-10 NOTE — Anesthesia Preprocedure Evaluation (Addendum)
Anesthesia Evaluation  Patient identified by MRN, date of birth, ID band Patient awake    Reviewed: Allergy & Precautions, NPO status , Patient's Chart, lab work & pertinent test results  Airway Mallampati: II  TM Distance: >3 FB Neck ROM: Full    Dental no notable dental hx.    Pulmonary Current Smoker and Patient abstained from smoking.   Pulmonary exam normal breath sounds clear to auscultation       Cardiovascular Normal cardiovascular exam Rhythm:Regular Rate:Normal  2022 Cath showed mild non-obstructive CAD and medical management recommended. Echo showed mild LVH, normal EF, and no significant valvular disease   Neuro/Psych negative neurological ROS  negative psych ROS   GI/Hepatic negative GI ROS, Neg liver ROS,,,  Endo/Other  negative endocrine ROS    Renal/GU negative Renal ROS  negative genitourinary   Musculoskeletal negative musculoskeletal ROS (+)    Abdominal   Peds  Hematology negative hematology ROS (+)   Anesthesia Other Findings Right ureteral CA  Right Oropharyngeal mass Spoke with Dr. Ishmael Holter with ENT on 08/22/2022. Per Dr. Chestine Spore the mass has decreased in size, she is unsure the explanation for this.  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  Dr. Chestine Spore scoped the patient and mass did not involve the pharynx, it is submucosal.  Airway is very patent and should be an easy intubation.   08/28/22: intubated with glidescope grade I view, no complications.   S/p radiation. Last round 3 weeks ago  Reproductive/Obstetrics                             Anesthesia Physical Anesthesia Plan  ASA: 3  Anesthesia Plan: General   Post-op Pain Management:    Induction: Intravenous and Rapid sequence  PONV Risk Score and Plan: 3 and Dexamethasone and Ondansetron  Airway Management Planned: Oral ETT and Video Laryngoscope Planned  Additional Equipment:    Intra-op Plan:   Post-operative Plan: Extubation in OR  Informed Consent: I have reviewed the patients History and Physical, chart, labs and discussed the procedure including the risks, benefits and alternatives for the proposed anesthesia with the patient or authorized representative who has indicated his/her understanding and acceptance.     Dental advisory given  Plan Discussed with: CRNA and Surgeon  Anesthesia Plan Comments:        Anesthesia Quick Evaluation

## 2023-01-10 NOTE — Op Note (Signed)
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  Postoperative diagnosis:  Same Right nonobstructing renal stone Right ureteral stricture Small papillary lesions that are superficial in the urethra, approximately 1 cm in total, concern for low-grade superficial urethral cancer  Procedure: Cystoscopy, right retrograde pyelogram with interpretation Right ureteral stent exchange Right ureteroscopy, balloon ureteral dilation, ureteral biopsy and laser fulguration Right renal stone basket Urethral biopsy and fulguration  Surgeon: [name], MD  Anesthesia: General  Complications: None  Intraoperative findings:  #1: [name] retrograde pyelogram demonstrated normal distal ureter with no hydronephrosis.  There was a stricture segment measuring approximately 1-1/2 cm in the midportion of the ureter.  There was a small papillary lesion on the anterior wall within the segment of the ureter.  There is no additional tumors or other pathologic findings.  There was proximal hydroureter nephrosis with blunting of the calyces. #2: I was unable to pass the rigid scope up to the area of concern because of a stricture which I had to balloon dilate with a 15 French x 4 cm balloon dilator #3: There was a proximately 4 mm stone that was in the upper pole of the right kidney which I was able to basket. 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  I biopsied several of these areas and burned the remainder of them.  EBL: Minimal  Specimens:  #1: Right ureteral mass biopsy #2: Right renal stone #3: Urethral biopsies  Indication: Glen Anis Sr. is a 57 y.o. patient with history of nephrolithiasis as well as a low-grade not invading right ureteral cancer.  He was last seen and treated more than 6 months ago.  He presented today for surveillance  ureteroscopy.  After reviewing the management options for treatment, he elected to proceed with the above surgical procedure(s). We have discussed the potential benefits and risks of the procedure, side effects of the proposed treatment, the likelihood of the patient achieving the goals of the procedure, and any potential problems that might occur during the procedure or recuperation. Informed consent has been obtained.  Description of procedure:  Consent was obtained the preoperative holding area.  He was then brought back to the operating room placed on the table in supine position.  General esthesia was then induced and an endotracheal tube inserted.  He was placed in the dorsolithotomy position and prepped and draped in the routine sterile fashion.  Timeout was subsequently performed.  21 French 30 degree cystoscope was gently passed through the patient's urethra into the bladder under vision guidance.  Using a stent grasper aggressive patient's stent emanating from the part patient's right ureteral orifice and brought it down to the urethral meatus.  I advanced a wire up to the stent remove the stent over the wire.  Subsequently exchanged the wire for a 5 [place] open-ended ureteral catheter and performed retrograde pyelograms above findings.  I then repassed the wire and remove the open-ended catheter.  Then advanced the semirigid ureteroscope alongside the wire and up into the mid ureter where I was unable to advance it.  I subsequently remove the scope and advanced a 4 cm time 15 French balloon dilator.  I dilated the area of the stricture for 90 seconds before deflating it.  Reinspection of this area demonstrated nice dilated ureter in this area.  There was 2 small areas of concern on the anterior portion for recurrence  of the cancer.  There was no other discrete lesions or masses.  I biopsied these areas using the ureteral biopsy forceps.  I subsequently fulgurated the area around the area of concern  and then on the posterior surface and a near 360 degree fashion I fulgurated the base of the the lesion as well as the surrounding area.  I also incised the lumen of the ureter at the 5 o'clock position to help aid in the stricture treatment.  I then remove the semirigid scope for a single-lumen flexible ureteroscope.  I advanced this up into the patient's proximal ureter and into the renal pelvis.  I inspected the kidney and perform pyeloscopy using fluoroscopic guidance.  There was no abnormal unusual findings except for blunted calyces.  There was also a small stone in the upper pole.  I was able to easily grasp this with an engage basket and remove it without further fragmentation.  I then repassed the cystoscope over the wire and advanced a 26 cm time 6 French double-J ureteral stent over the wire and into the right renal pelvis.  Once it was noted to be well within the renal pelvis I advanced it to the bladder neck before removing the wire.  A nice curl was noted within the upper pole of the kidney and in the bladder.  I then slowly backed out the ureteroscope and noted these papillary lesions.  I biopsied several of them and sent this for further diagnosis.  I then fulgurated the areas copiously.  There is no additional findings.  I instilled local lidocaine numbing gel into the patient's urethra prior to emptying his bladder.  He subsequently was awoken and returned the PACU in stable condition.  Disposition: The patient has a stent, and will be scheduled for 70-month follow-up with me so that we can plan stent exchange and diagnostic ureteroscopy again.

## 2023-01-11 ENCOUNTER — Encounter (HOSPITAL_COMMUNITY): Payer: Self-pay | Admitting: Urology

## 2023-01-11 DIAGNOSIS — C661 Malignant neoplasm of right ureter: Secondary | ICD-10-CM | POA: Diagnosis not present

## 2023-01-11 MED ORDER — HYDROCODONE-ACETAMINOPHEN 5-325 MG PO TABS: 1.0000 | ORAL_TABLET | Freq: Four times a day (QID) | ORAL | 0 refills | Status: DC | PRN

## 2023-01-11 NOTE — Discharge Instructions (Signed)
Patient given list of [facility]

## 2023-01-11 NOTE — Plan of Care (Signed)

## 2023-01-11 NOTE — Progress Notes (Signed)
Patient given discharge, medication, and follow up instructions, verbalized understanding, IV removed, personal belongings with patient, friend to transport home

## 2023-01-11 NOTE — Plan of Care (Signed)
  Problem: Pain Management: Goal: General experience of comfort will improve Outcome: Progressing   Problem: Safety: Goal: Ability to remain free from injury will improve Outcome: Progressing

## 2023-01-11 NOTE — TOC Initial Note (Signed)
Transition of Care (TOC) - Initial/Assessment Note    Patient Details  Name: Glen BONNAR Sr. MRN: [id number] Date of Birth: February 06, 1966  Transition of Care [facility]) CM/SW Contact:    Adrian Prows, RN Phone Number: 01/11/2023, 11:01 AM  Clinical Narrative:                 SDOH risks; spoke w/ pt in room; pt says he rents a room in a house; he plans to return at d/c; pt identified POC Myrtie Neither (friend) [phone]; he has transportation; pt says he has transportation; he says he receives food stamps but he runs out; pt also says he has difficulty paying for housing because he has no income; pt says his disability has "just been approved"; pt has insurance; he de does not have a PCP; pt says he does not have DME, HH services or home oxygen; he agrees to receive resources; pt given resources for [employer], Guides to [facility] and Guide to [facility]; he will make appt at agencies of choice; copies of resources placed in d/c instructions; copies also given to pt; no TOC needs.  Expected Discharge Plan: Home/Self Care Barriers to Discharge: No Barriers Identified   Patient Goals and CMS Choice Patient states their goals for this hospitalization and ongoing recovery are:: home CMS Medicare.gov Compare Post Acute Care list provided to:: Patient        Expected Discharge Plan and Services   Discharge Planning Services: CM Consult Post Acute Care Choice: NA Living arrangements for the past 2 months: Single Family Home Expected Discharge Date: 01/11/23               DME Arranged: N/A DME Agency: NA       HH Arranged: NA HH Agency: NA        Prior Living Arrangements/Services Living arrangements for the past 2 months: Single Family Home Lives with:: Other (Comment) (patient rents room) Patient language and need for interpreter reviewed:: Yes Do you feel safe going back to the place where you live?: Yes      Need for Family Participation  in Patient Care: Yes (Comment) Care giver support system in place?: Yes (comment) Current home services:  (n/a) Criminal Activity/Legal Involvement Pertinent to Current Situation/Hospitalization: No - Comment as needed  Activities of Daily Living   ADL Screening (condition at time of admission) Independently performs ADLs?: Yes (appropriate for developmental age) Is the patient deaf or have difficulty hearing?: No Does the patient have difficulty seeing, even when wearing glasses/contacts?: Yes Does the patient have difficulty concentrating, remembering, or making decisions?: No  Permission Sought/Granted Permission sought to share information with : Case Manager Permission granted to share information with : Yes, Verbal Permission Granted  Share Information with NAME: Case Manager     Permission granted to share info w Relationship: Myrtie Neither (friend) [phone]     Emotional Assessment Appearance:: Appears stated age Attitude/Demeanor/Rapport: Gracious Affect (typically observed): Accepting Orientation: : Oriented to Self, Oriented to Place, Oriented to  Time, Oriented to Situation Alcohol / Substance Use: Not Applicable Psych Involvement: No (comment)  Admission diagnosis:  Ureteral cancer, right [facility]) [C66.1] Patient Active Problem List   Diagnosis Date Noted   Ureteral cancer, right (HCC) 01/10/2023   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 [place]  [address] Phone: [phone] Fax: [phone]  [facility] [address], [place] - [address] [facility] [place] [address] Phone: [phone] Fax: [phone]  [facility] [address], [place] - [address] [place] [address] Phone: [phone] Fax: [phone]     Social Determinants of Health (SDOH) Social History: SDOH Screenings   Food Insecurity: Food Insecurity Present (01/11/2023)  Housing: Medium Risk (01/11/2023)  Transportation Needs: No Transportation Needs (01/11/2023)  Recent Concern: Transportation Needs - Unmet Transportation Needs (01/10/2023)  Utilities: Not At Risk (01/11/2023)  Alcohol Screen: Low Risk  (10/15/2022)  Depression (PHQ2-9): Low Risk  (10/15/2022)  Social Connections: Unknown (10/30/2022)   Received from [facility]  Tobacco Use: High Risk (01/10/2023)   SDOH Interventions: Food Insecurity Interventions: Inpatient TOC, Other (Comment) ([employer]orce given) Housing Interventions: Inpatient TOC, Other (Comment) (resource given) Transportation Interventions: Intervention Not Indicated, Inpatient TOC Utilities Interventions: Intervention Not Indicated, Inpatient TOC   Readmission Risk Interventions     No data to display

## 2023-01-12 ENCOUNTER — Other Ambulatory Visit: Payer: Self-pay | Admitting: Urology

## 2023-01-12 MED ORDER — OXYCODONE-ACETAMINOPHEN 5-325 MG PO TABS: 1.0000 | ORAL_TABLET | ORAL | 0 refills | Status: DC | PRN

## 2023-01-12 MED ORDER — DOXYCYCLINE HYCLATE 100 MG PO CAPS: 100.0000 mg | ORAL_CAPSULE | Freq: Two times a day (BID) | ORAL | 0 refills | Status: DC

## 2023-01-13 ENCOUNTER — Ambulatory Visit: Payer: Medicaid Other

## 2023-01-13 LAB — SURGICAL PATHOLOGY

## 2023-01-14 ENCOUNTER — Ambulatory Visit: Payer: Medicaid Other

## 2023-01-14 NOTE — Discharge Summary (Signed)
Physician Discharge Summary  Patient ID: Glen Anis Sr. MRN: [id number] DOB/AGE: 1965-08-31 57 y.o.  Admit date: 01/10/2023 Discharge date: 01/11/2023  Admission Diagnoses:  Ureteral cancer, right [facility])  Discharge Diagnoses:  Principal Problem:   Ureteral cancer, right [facility])   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   discomfort with swallowing, voice changes   Legally blind    both eyes  right eye prosthetic   Ureter cancer, right (HCC) 12/2021   neck and throat cancer urologist--- dr Marlou Porch;   dx 12-12-2021  s/p  ureteral bx ,  noninvasive low grade papillary urothelial cnacer    Surgeries: Procedure(s): CYSTOSCOPY WITH RIGHT URETERAL TUMOR RESECTION AND RIGHT URETERAL STENT EXCHANGE; RIGHT URETERAL BIOPSY; URETHERAL BIOPSY; RIGHT URETERAL BALLOON DILATION; URETHERAL FULGARATION on 01/10/2023   Consultants (if any):   Discharged Condition: Improved  Hospital Course: Glen GERE Sr. is an 57 y.o. male who was admitted 01/10/2023 with a diagnosis of Ureteral cancer, right (HCC) and went to the operating room on 01/10/2023 and underwent the above named procedures.    He was given perioperative antibiotics:  Anti-infectives (From admission, onward)    Start     Dose/Rate Route Frequency Ordered Stop   01/10/23 0957  ciprofloxacin (CIPRO) IVPB 400 mg        400 mg 200 mL/hr over 60 Minutes Intravenous 60 min pre-op 01/10/23 0957 01/10/23 1215   01/10/23 0957  ciprofloxacin (CIPRO) IVPB 400 mg  Status:  Discontinued        400 mg 200 mL/hr over 60 Minutes Intravenous 60 min pre-op 01/10/23 0957 01/10/23 0959     .  He was given sequential compression devices, early ambulation for DVT prophylaxis.  He benefited maximally from the hospital stay and there were no complications.    Recent vital signs:  Vitals:   01/11/23 0248 01/11/23 0556  BP: 105/70 107/69   Pulse: 88 80  Resp: 16 20  Temp: 97.9 F (36.6 C) 97.8 F (36.6 C)  SpO2: 97% 96%    Recent laboratory studies:  Lab Results  Component Value Date   HGB 14.4 01/09/2023   HGB 14.7 12/02/2022   HGB 14.2 11/22/2022   Lab Results  Component Value Date   WBC 4.2 01/09/2023   PLT 206 01/09/2023   No results found for: "INR" Lab Results  Component Value Date   NA 136 01/09/2023   K 3.8 01/09/2023   CL 100 01/09/2023   CO2 26 01/09/2023   BUN 14 01/09/2023   CREATININE 0.88 01/09/2023   GLUCOSE 169 (H) 01/09/2023    Discharge Medications:   Allergies as of 01/11/2023       Reactions   Other    States he can't have MRI due to metal shrapnel in his body and in his eye   Sulfa Antibiotics Itching   Gi intolerance        Medication List     TAKE these medications    brimonidine 0.2 % ophthalmic solution Commonly known as: ALPHAGAN Place 1 drop into the left eye 3 (three) times daily.   Combigan 0.2-0.5 % ophthalmic solution Generic drug: brimonidine-timolol Place 1 drop into the left eye every 12 (twelve) hours.   diazepam 5 MG tablet Commonly known as: VALIUM Take 1 tablet (5 mg total) by mouth every 8 (eight) hours as needed for anxiety.  dorzolamide 2 % ophthalmic solution Commonly known as: TRUSOPT Place 1 drop into the left eye 3 (three) times daily.   HYDROcodone-acetaminophen 5-325 MG tablet Commonly known as: Norco Take 1 tablet by mouth every 6 (six) hours as needed for moderate pain (pain score 4-6).   latanoprost 0.005 % ophthalmic solution Commonly known as: XALATAN Place 1 drop into the left eye at bedtime.   phenazopyridine 200 MG tablet Commonly known as: Pyridium Take 1 tablet (200 mg total) by mouth 3 (three) times daily as needed for pain.   timolol 0.25 % ophthalmic solution Commonly known as: TIMOPTIC Place 1 drop into the left eye daily.   traMADol 50 MG tablet Commonly known as: Ultram Take 1-2 tablets (50-100 mg total)  by mouth every 6 (six) hours as needed for moderate pain (pain score 4-6).        Diagnostic Studies: DG C-Arm 1-60 Min-No Report  Result Date: 01/10/2023 Fluoroscopy was utilized by the requesting physician.  No radiographic interpretation.    Disposition: Discharge disposition: 01-Home or Self Care       Discharge Instructions     Discharge patient   Complete by: As directed    Discharge disposition: 01-Home or Self Care   Discharge patient date: 01/11/2023        Follow-up Information     [name], MD. Call in 1 week(s).   Specialty: Urology Contact information: [address] [place] [address] [phone]                  Signed: Wilkie Aye 01/14/2023, 7:49 AM

## 2023-01-15 ENCOUNTER — Ambulatory Visit: Payer: Medicaid Other

## 2023-01-16 ENCOUNTER — Ambulatory Visit: Payer: Medicaid Other

## 2023-01-17 ENCOUNTER — Ambulatory Visit: Payer: Medicaid Other

## 2023-01-20 ENCOUNTER — Ambulatory Visit: Payer: Medicaid Other

## 2023-01-21 ENCOUNTER — Ambulatory Visit: Payer: Medicaid Other

## 2023-01-22 ENCOUNTER — Ambulatory Visit: Payer: Medicaid Other

## 2023-01-23 ENCOUNTER — Ambulatory Visit: Payer: Medicaid Other

## 2023-01-24 ENCOUNTER — Ambulatory Visit: Payer: Medicaid Other

## 2023-01-27 ENCOUNTER — Ambulatory Visit: Payer: Medicaid Other

## 2023-01-28 ENCOUNTER — Ambulatory Visit: Payer: Medicaid Other

## 2023-01-29 ENCOUNTER — Ambulatory Visit: Payer: Medicaid Other
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
## 2023-02-03 ENCOUNTER — Ambulatory Visit: Payer: Medicaid Other

## 2023-02-04 ENCOUNTER — Inpatient Hospital Stay: Payer: Medicaid Other | Admitting: Nurse Practitioner

## 2023-02-04 ENCOUNTER — Telehealth: Payer: Self-pay | Admitting: *Deleted

## 2023-02-04 ENCOUNTER — Ambulatory Visit: Payer: Medicaid Other

## 2023-02-04 ENCOUNTER — Ambulatory Visit: Payer: Medicaid Other | Admitting: Radiation Oncology

## 2023-02-04 NOTE — Telephone Encounter (Signed)
CALLED PATIENT TO ASK ABOUT RESCHEDULING TODAY'S FU, SPOKE WITH PATIENT AND HE AGREED TO COME ON  02-07-23 @ 11:20 AM

## 2023-02-05 ENCOUNTER — Ambulatory Visit: Payer: Medicaid Other

## 2023-02-05 ENCOUNTER — Inpatient Hospital Stay: Payer: Medicaid Other | Attending: Internal Medicine | Admitting: Nurse Practitioner

## 2023-02-07 ENCOUNTER — Ambulatory Visit: Payer: Medicaid Other | Attending: Radiation Oncology | Admitting: Radiation Oncology

## 2023-02-07 ENCOUNTER — Telehealth: Payer: Self-pay

## 2023-02-07 NOTE — Progress Notes (Incomplete)
Glen Mcmillan presents today for follow-up after stopping radiation early to his right tonsil on 12/02/2022  Pain issues, if any: *** Using a feeding tube?: N/A Weight changes, if any: *** Swallowing issues, if any: *** Smoking or chewing tobacco? *** Using fluoride toothpaste daily? *** Last ENT visit was on: *** Other notable issues, if any: ***  01/10/2023 Dr. Berniece Salines (urology) --Procedure: Cystoscopy, right retrograde pyelogram with interpretation Right ureteral stent exchange Right ureteroscopy, balloon ureteral dilation, ureteral biopsy and laser fulguration Right renal stone basket Urethral biopsy and fulguration  FINAL MICROSCOPIC DIAGNOSIS:  A. URETER, RIGHT, BIOPSY:  Small fragments of fibrous tissue with chronic inflammation  B. 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

## 2023-02-07 NOTE — Telephone Encounter (Signed)
Noticed patient had not checked in for 11:20 F/U appointment with Dr. Basilio Cairo. Called and left a VM providing direct call back number requesting a return call to inform us if he would still try to come into clinic today or needed to reschedule.

## 2023-06-01 ENCOUNTER — Other Ambulatory Visit: Payer: Self-pay

## 2023-06-01 ENCOUNTER — Emergency Department (HOSPITAL_COMMUNITY)
Admission: EM | Admit: 2023-06-01 | Discharge: 2023-06-02 | Disposition: A | Discharge status: Home / Self Care | Attending: Emergency Medicine | Admitting: Emergency Medicine

## 2023-06-01 DIAGNOSIS — R1110 Vomiting, unspecified: Secondary | ICD-10-CM | POA: Diagnosis not present

## 2023-06-01 DIAGNOSIS — R1031 Right lower quadrant pain: Secondary | ICD-10-CM | POA: Insufficient documentation

## 2023-06-01 MED ORDER — MORPHINE SULFATE (PF) 4 MG/ML IV SOLN
4.0000 mg | Freq: Once | INTRAVENOUS | Status: AC
  Administered 2023-06-01: 4 mg via INTRAVENOUS
  Filled 2023-06-01: qty 1

## 2023-06-01 MED ORDER — FENTANYL CITRATE PF 50 MCG/ML IJ SOSY
50.0000 ug | PREFILLED_SYRINGE | Freq: Once | INTRAMUSCULAR | Status: AC
  Administered 2023-06-01: 50 ug via INTRAVENOUS
  Filled 2023-06-01: qty 1

## 2023-06-01 NOTE — ED Triage Notes (Signed)
 Pt states he has an appt with urology tomorrow to replace urethral stents, but is having too much pain in his groin up to his abdomen, and can't wait.

## 2023-06-01 NOTE — ED Provider Notes (Signed)
 [place] EMERGENCY DEPARTMENT AT [facility] Provider Note   CSN: [id number] Arrival date & time: 06/01/23  2129     History {Add pertinent medical, surgical, social history, OB history to HPI:1} Chief Complaint  Patient presents with   Abdominal Pain    Glen RIVADENEIRA Sr. is a 58 y.o. male.  The history is provided by the patient.  Abdominal Pain Glen Motton. is a 58 y.o. male who presents to the Emergency Department complaining of *** Stents placed in October.  Plans to see urology on Monday for stent evaluation.  Went to [facility] ED on Tuesday.   Pain in lower abdomen radiating to right flank, started last week.  Seen in ED and started on abx - has two left.  Has vomiting since yesterday. No fever.  Cold seems to help his pain.  Percocet does not help his pain.  On cefdinir.      Home Medications Prior to Admission medications   Medication Sig Start Date End Date Taking? Authorizing Provider  brimonidine (ALPHAGAN) 0.2 % ophthalmic solution Place 1 drop into the left eye 3 (three) times daily. 12/05/22   [provider]  COMBIGAN 0.2-0.5 % ophthalmic solution Place 1 drop into the left eye every 12 (twelve) hours. 12/18/22   [provider]  diazepam (VALIUM) 5 MG tablet Take 1 tablet (5 mg total) by mouth every 8 (eight) hours as needed for anxiety. 11/27/22   Pickenpack-Cousar, Arty Baumgartner, NP  dorzolamide (TRUSOPT) 2 % ophthalmic solution Place 1 drop into the left eye 3 (three) times daily. 12/05/22   [provider]  doxycycline (VIBRAMYCIN) 100 MG capsule Take 1 capsule (100 mg total) by mouth every 12 (twelve) hours. 01/12/23   McKenzie, Mardene Celeste, MD  HYDROcodone-acetaminophen (NORCO) 5-325 MG tablet Take 1 tablet by mouth every 6 (six) hours as needed for moderate pain (pain score 4-6). 01/11/23   McKenzie, Mardene Celeste, MD  latanoprost (XALATAN) 0.005 % ophthalmic solution Place 1 drop into the left eye at bedtime.    [provider]  oxyCODONE-acetaminophen (PERCOCET) 5-325 MG tablet Take 1 tablet by mouth every 4 (four) hours as needed. 01/12/23   McKenzie, Mardene Celeste, MD  phenazopyridine (PYRIDIUM) 200 MG tablet Take 1 tablet (200 mg total) by mouth 3 (three) times daily as needed for pain. 01/10/23   [name], MD  timolol (TIMOPTIC) 0.25 % ophthalmic solution Place 1 drop into the left eye daily.    [provider]  traMADol (ULTRAM) 50 MG tablet Take 1-2 tablets (50-100 mg total) by mouth every 6 (six) hours as needed for moderate pain (pain score 4-6). 01/10/23   [name], MD      Allergies    Sulfa antibiotics and Other    Review of Systems   Review of Systems  Gastrointestinal:  Positive for abdominal pain.  All other systems reviewed and are negative.   Physical Exam Updated Vital Signs BP (!) 130/90 (BP Location: Left Arm)   Pulse 96   Temp 97.6 F (36.4 C) (Oral)   Resp 18   SpO2 96%  Physical Exam Vitals and nursing note reviewed.  Constitutional:      Appearance: He is well-developed.  HENT:     Head: Normocephalic and atraumatic.  Cardiovascular:     Rate and Rhythm: Normal rate and regular rhythm.  Pulmonary:     Effort: Pulmonary effort is normal. No respiratory distress.  Abdominal:  Palpations: Abdomen is soft.     Tenderness: There is abdominal tenderness. There is no guarding or rebound.     Comments: Moderate generalized abdominal tenderness  Musculoskeletal:        General: No tenderness.  Skin:    General: Skin is warm and dry.  Neurological:     Mental Status: He is alert and oriented to person, place, and time.  Psychiatric:        Behavior: Behavior normal.     ED Results / Procedures / Treatments   Labs (all labs ordered are listed, but only abnormal results are displayed) Labs Reviewed  URINALYSIS, ROUTINE W REFLEX MICROSCOPIC    EKG None  Radiology No results found.  Procedures Procedures  {Document cardiac  monitor, telemetry assessment procedure when appropriate:1}  Medications Ordered in ED Medications - No data to display  ED Course/ Medical Decision Making/ A&P   {   Click here for ABCD2, HEART and other calculatorsREFRESH Note before signing :1}                              Medical Decision Making Amount and/or Complexity of Data Reviewed Labs: ordered.   ***  {Document critical care time when appropriate:1} {Document review of labs and clinical decision tools ie heart score, Chads2Vasc2 etc:1}  {Document your independent review of radiology images, and any outside records:1} {Document your discussion with family members, caretakers, and with consultants:1} {Document social determinants of health affecting pt's care:1} {Document your decision making why or why not admission, treatments were needed:1} Final Clinical Impression(s) / ED Diagnoses Final diagnoses:  None    Rx / DC Orders ED Discharge Orders     None

## 2023-06-02 ENCOUNTER — Emergency Department (HOSPITAL_COMMUNITY)

## 2023-06-02 LAB — COMPREHENSIVE METABOLIC PANEL WITH GFR
ALT: 19 U/L (ref 0–44)
AST: 19 U/L (ref 15–41)
Albumin: 3.5 g/dL (ref 3.5–5.0)
Alkaline Phosphatase: 63 U/L (ref 38–126)
Anion gap: 8 (ref 5–15)
BUN: 16 mg/dL (ref 6–20)
CO2: 27 mmol/L (ref 22–32)
Calcium: 8.6 mg/dL — ABNORMAL LOW (ref 8.9–10.3)
Chloride: 103 mmol/L (ref 98–111)
Creatinine, Ser: 1.05 mg/dL (ref 0.61–1.24)
GFR, Estimated: >60 mL/min (ref >60)
Glucose, Bld: 175 mg/dL — ABNORMAL HIGH (ref 70–99)
Potassium: 3.7 mmol/L (ref 3.5–5.1)
Sodium: 138 mmol/L (ref 135–145)
Total Bilirubin: 0.6 mg/dL (ref 0.0–1.2)
Total Protein: 6.6 g/dL (ref 6.5–8.1)

## 2023-06-02 LAB — URINALYSIS, ROUTINE W REFLEX MICROSCOPIC
Bacteria, UA: NONE SEEN
Bilirubin Urine: NEGATIVE
Glucose, UA: NEGATIVE mg/dL
Ketones, ur: NEGATIVE mg/dL
Nitrite: NEGATIVE
Protein, ur: 30 mg/dL — AB
RBC / HPF: >50 RBC/hpf (ref 0–5)
Specific Gravity, Urine: 1.021 (ref 1.005–1.030)
pH: 5 (ref 5.0–8.0)

## 2023-06-02 LAB — CBC WITH DIFFERENTIAL/PLATELET
Abs Immature Granulocytes: 0.01 10*3/uL (ref 0.00–0.07)
Basophils Absolute: 0 10*3/uL (ref 0.0–0.1)
Basophils Relative: 1 %
Eosinophils Absolute: 0.2 10*3/uL (ref 0.0–0.5)
Eosinophils Relative: 3 %
HCT: 47.1 % (ref 39.0–52.0)
Hemoglobin: 15.5 g/dL (ref 13.0–17.0)
Immature Granulocytes: 0 %
Lymphocytes Relative: 30 %
Lymphs Abs: 1.6 10*3/uL (ref 0.7–4.0)
MCH: 31.4 pg (ref 26.0–34.0)
MCHC: 32.9 g/dL (ref 30.0–36.0)
MCV: 95.5 fL (ref 80.0–100.0)
Monocytes Absolute: 0.7 10*3/uL (ref 0.1–1.0)
Monocytes Relative: 13 %
Neutro Abs: 2.9 10*3/uL (ref 1.7–7.7)
Neutrophils Relative %: 53 %
Platelets: 185 10*3/uL (ref 150–400)
RBC: 4.93 MIL/uL (ref 4.22–5.81)
RDW: 13.5 % (ref 11.5–15.5)
WBC: 5.4 10*3/uL (ref 4.0–10.5)
nRBC: 0 % (ref 0.0–0.2)

## 2023-06-02 MED ORDER — HYDROMORPHONE HCL 1 MG/ML IJ SOLN
1.0000 mg | Freq: Once | INTRAMUSCULAR | Status: AC
  Administered 2023-06-02: 1 mg via INTRAVENOUS
  Filled 2023-06-02: qty 1

## 2023-06-02 NOTE — ED Notes (Signed)
 Patient transported to CT

## 2023-06-03 ENCOUNTER — Other Ambulatory Visit: Payer: Self-pay | Admitting: Urology

## 2023-06-03 LAB — URINE CULTURE: Culture: NO GROWTH

## 2023-06-05 ENCOUNTER — Emergency Department (HOSPITAL_COMMUNITY)
Admission: EM | Admit: 2023-06-05 | Discharge: 2023-06-05 | Disposition: A | Discharge status: Home / Self Care | Attending: Emergency Medicine | Admitting: Emergency Medicine

## 2023-06-05 ENCOUNTER — Emergency Department (HOSPITAL_COMMUNITY)

## 2023-06-05 DIAGNOSIS — R109 Unspecified abdominal pain: Secondary | ICD-10-CM | POA: Diagnosis present

## 2023-06-05 DIAGNOSIS — N23 Unspecified renal colic: Secondary | ICD-10-CM

## 2023-06-05 DIAGNOSIS — N200 Calculus of kidney: Secondary | ICD-10-CM | POA: Insufficient documentation

## 2023-06-05 DIAGNOSIS — Z8554 Personal history of malignant neoplasm of ureter: Secondary | ICD-10-CM | POA: Diagnosis not present

## 2023-06-05 LAB — BASIC METABOLIC PANEL WITH GFR
Anion gap: 8 (ref 5–15)
BUN: 16 mg/dL (ref 6–20)
CO2: 24 mmol/L (ref 22–32)
Calcium: 8.9 mg/dL (ref 8.9–10.3)
Chloride: 105 mmol/L (ref 98–111)
Creatinine, Ser: 1 mg/dL (ref 0.61–1.24)
GFR, Estimated: >60 mL/min (ref >60)
Glucose, Bld: 129 mg/dL — ABNORMAL HIGH (ref 70–99)
Potassium: 3.7 mmol/L (ref 3.5–5.1)
Sodium: 137 mmol/L (ref 135–145)

## 2023-06-05 LAB — URINALYSIS, ROUTINE W REFLEX MICROSCOPIC
Bacteria, UA: NONE SEEN
Bilirubin Urine: NEGATIVE
Glucose, UA: NEGATIVE mg/dL
Ketones, ur: NEGATIVE mg/dL
Nitrite: NEGATIVE
Protein, ur: NEGATIVE mg/dL
RBC / HPF: >50 RBC/hpf (ref 0–5)
Specific Gravity, Urine: 1.016 (ref 1.005–1.030)
pH: 6 (ref 5.0–8.0)

## 2023-06-05 LAB — CBC
HCT: 44.1 % (ref 39.0–52.0)
Hemoglobin: 14.8 g/dL (ref 13.0–17.0)
MCH: 31 pg (ref 26.0–34.0)
MCHC: 33.6 g/dL (ref 30.0–36.0)
MCV: 92.3 fL (ref 80.0–100.0)
Platelets: 171 10*3/uL (ref 150–400)
RBC: 4.78 MIL/uL (ref 4.22–5.81)
RDW: 13.6 % (ref 11.5–15.5)
WBC: 4.1 10*3/uL (ref 4.0–10.5)
nRBC: 0 % (ref 0.0–0.2)

## 2023-06-05 MED ORDER — OXYBUTYNIN CHLORIDE ER 10 MG PO TB24: 10.0000 mg | ORAL_TABLET | Freq: Every day | ORAL | 0 refills | Status: DC

## 2023-06-05 MED ORDER — ONDANSETRON HCL 4 MG PO TABS: 4.0000 mg | ORAL_TABLET | Freq: Three times a day (TID) | ORAL | 0 refills | Status: DC | PRN

## 2023-06-05 MED ORDER — KETOROLAC TROMETHAMINE 10 MG PO TABS: 10.0000 mg | ORAL_TABLET | Freq: Four times a day (QID) | ORAL | 0 refills | Status: DC | PRN

## 2023-06-05 MED ORDER — OXYCODONE HCL 10 MG PO TABS: 5.0000 mg | ORAL_TABLET | ORAL | 0 refills | Status: DC | PRN

## 2023-06-05 MED ORDER — ONDANSETRON HCL 4 MG/2ML IJ SOLN
4.0000 mg | Freq: Once | INTRAMUSCULAR | Status: AC
  Administered 2023-06-05: 4 mg via INTRAVENOUS
  Filled 2023-06-05: qty 2

## 2023-06-05 MED ORDER — HYDROMORPHONE HCL 1 MG/ML IJ SOLN
0.5000 mg | Freq: Once | INTRAMUSCULAR | Status: AC
  Administered 2023-06-05: 0.5 mg via INTRAVENOUS
  Filled 2023-06-05: qty 1

## 2023-06-05 NOTE — ED Provider Notes (Signed)
 [place] EMERGENCY DEPARTMENT AT [facility] Provider Note   CSN: [id number] Arrival date & time: 06/05/23  1648     History  Chief Complaint  Patient presents with   Flank Pain    Glen Erie Sr. is a 58 y.o. male with a past medical history of ureteral cancer on the right side.  Patient has right ureteral stent in place.  He reports progressively worsening and now severe symptoms of renal colic on the right.  He is also not made a bowel movement in the last 2 days.  He has had nausea and vomiting today.  He called his urologist who told him to come in for evaluation.  He denies fevers but is concerned his stent may be blocked.  Pain is severe.  Right sided, colicky.   Flank Pain       Home Medications Prior to Admission medications   Medication Sig Start Date End Date Taking? Authorizing Provider  ketorolac (TORADOL) 10 MG tablet Take 1 tablet (10 mg total) by mouth every 6 (six) hours as needed. 06/05/23  Yes Gean Larose, PA-C  ondansetron (ZOFRAN) 4 MG tablet Take 1 tablet (4 mg total) by mouth every 8 (eight) hours as needed for nausea or vomiting. 06/05/23  Yes Laylynn Campanella, PA-C  oxybutynin (DITROPAN XL) 10 MG 24 hr tablet Take 1 tablet (10 mg total) by mouth at bedtime. 06/05/23  Yes Zeek Rostron, PA-C  oxyCODONE 10 MG TABS Take 0.5-1 tablets (5-10 mg total) by mouth every 4 (four) hours as needed for severe pain (pain score 7-10). 06/05/23  Yes Shaindy Reader, PA-C  brimonidine (ALPHAGAN) 0.2 % ophthalmic solution Place 1 drop into the left eye 3 (three) times daily. 12/05/22   [provider]  COMBIGAN 0.2-0.5 % ophthalmic solution Place 1 drop into the left eye every 12 (twelve) hours. 12/18/22   [provider]  diazepam (VALIUM) 5 MG tablet Take 1 tablet (5 mg total) by mouth every 8 (eight) hours as needed for anxiety. 11/27/22   Pickenpack-Cousar, Athena N, NP  dorzolamide (TRUSOPT) 2 % ophthalmic solution Place 1 drop into the left  eye 3 (three) times daily. 12/05/22   [provider]  doxycycline (VIBRAMYCIN) 100 MG capsule Take 1 capsule (100 mg total) by mouth every 12 (twelve) hours. 01/12/23   McKenzie, Arden Beck, MD  latanoprost (XALATAN) 0.005 % ophthalmic solution Place 1 drop into the left eye at bedtime.    [provider]  phenazopyridine (PYRIDIUM) 200 MG tablet Take 1 tablet (200 mg total) by mouth 3 (three) times daily as needed for pain. 01/10/23   Andrez Banker, MD  timolol (TIMOPTIC) 0.25 % ophthalmic solution Place 1 drop into the left eye daily.    [provider]      Allergies    Sulfa antibiotics and Other    Review of Systems   Review of Systems  Genitourinary:  Positive for flank pain.    Physical Exam Updated Vital Signs BP 129/82   Pulse 71   Temp (!) 97.4 F (36.3 C) (Oral)   Resp 17   SpO2 95%  Physical Exam  ED Results / Procedures / Treatments   Labs (all labs ordered are listed, but only abnormal results are displayed) Labs Reviewed  BASIC METABOLIC PANEL WITH GFR - Abnormal; Notable for the following components:      Result Value   Glucose, Bld 129 (*)    All other components within normal limits  URINALYSIS, ROUTINE W REFLEX MICROSCOPIC - Abnormal; Notable for the following components:   Hgb urine dipstick MODERATE (*)    Leukocytes,Ua TRACE (*)    All other components within normal limits  CBC    EKG None  Radiology No results found.  Procedures Procedures    Medications Ordered in ED Medications  HYDROmorphone (DILAUDID) injection 0.5 mg (0.5 mg Intravenous Given 06/05/23 1832)  ondansetron (ZOFRAN) injection 4 mg (4 mg Intravenous Given 06/05/23 1832)  HYDROmorphone (DILAUDID) injection 0.5 mg (0.5 mg Intravenous Given 06/05/23 2148)    ED Course/ Medical Decision Making/ A&P Clinical Course as of 06/08/23 1518  Thu Jun 05, 2023  2111 RBC / HPF: >50 [AH]  2111 WBC, UA: 0-5 [AH]  2111 Bacteria, UA: NONE SEEN [AH]  Sun Jun 08, 2023  1507 WBC: 4.1 [AH]    Clinical Course User Index [AH] Tama Fails, PA-C                                 Medical Decision Making Amount and/or Complexity of Data Reviewed Labs: ordered. Decision-making details documented in ED Course. Radiology: ordered.  Risk Prescription drug management.   This patient presents to the ED for concern of flank pain with a hx of renal cancer  s/p stent. The differential diagnosis of emergent flank pain includes, but is not limited to :Abdominal aortic aneurysm,, Renal artery embolism,Renal vein thrombosis, Aortic dissection, Mesenteric ischemia, Pyelonephritis, Renal infarction, Renal hemorrhage, Nephrolithiasis/ Renal Colic, Bladder tumor,Cystitis, Biliary colic, Pancreatitis Perforated peptic ulcer Appendicitis ,Inguinal Hernia, Diverticulitis, Bowel obstructionShingles Lower lobe pneumonia, Retroperitoneal hematoma/abscess/tumor, Epidural abscess, Epidural hematoma     Additional history obtained:  Additional history obtained from emr    Lab Tests:  I Ordered, and personally interpreted labs.  The pertinent results include:    UA- mod hgb no signs of infection Glu 129   Imaging Studies ordered:  I ordered imaging studies including CT renal study  I independently visualized and interpreted imaging which showed no acute findings  I agree with the radiologist interpretation   Medicines ordered and prescription drug management:  I ordered medication including Pain medication  for flank pain  Reevaluation of the patient after these medicines showed that the patient improved I have reviewed the patients home medicines and have made adjustments as needed   Problem List / ED Course:  FLank pain. No signs of infection or obstruction. Normal renal fx.  Pain control and antispasmodics for pain- suspect ureteral spasm due to stent. Aaron AasPDMP reviewed during this encounter.            Final Clinical Impression(s) / ED  Diagnoses Final diagnoses:  Renal colic    Rx / DC Orders ED Discharge Orders          Ordered    oxyCODONE 10 MG TABS  Every 4 hours PRN        06/05/23 2117    ondansetron (ZOFRAN) 4 MG tablet  Every 8 hours PRN        06/05/23 2117    ketorolac (TORADOL) 10 MG tablet  Every 6 hours PRN        06/05/23 2117    oxybutynin (DITROPAN XL) 10 MG 24 hr tablet  Daily at bedtime        06/05/23 2117              Tama Fails, PA-C 06/08/23 1519    Trifan,  Janalyn Me, MD 06/18/23 2006

## 2023-06-05 NOTE — ED Triage Notes (Signed)
 Pt c/o R sided flank pain and dysuria for the last several weeks. 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 They were due to be replaced next week, but pt having increased pain.

## 2023-06-05 NOTE — Discharge Instructions (Addendum)
Contact a health care provider if: You have a fever or chills. Your pee smells bad or looks cloudy. You have pain or burning when you pee. You have blood in your pee. Get help right away if: The pain in your flank or groin suddenly gets worse. You become confused or do not know the time of day, where you are, or who you are (become disoriented). You feel like you may faint or you faint. 

## 2023-06-05 NOTE — Patient Instructions (Signed)
 SURGICAL WAITING ROOM VISITATION  Patients having surgery or a procedure may have no more than 2 support people in the waiting area - these visitors may rotate.    Children under the age of 61 must have an adult with them who is not the patient.  Due to an increase in RSV and influenza rates and associated hospitalizations, children ages 75 and under may not visit patients in [facility] hospitals.  Visitors with respiratory illnesses are discouraged from visiting and should remain at home.  If the patient needs to stay at the hospital during part of their recovery, the visitor guidelines for inpatient rooms apply. Pre-op nurse will coordinate an appropriate time for 1 support person to accompany patient in pre-op.  This support person may not rotate.    Please refer to the [facility] website for the visitor guidelines for Inpatients (after your surgery is over and you are in a regular room).       Your procedure is scheduled on: 06-11-23    Report to [facility] Main Entrance    Report to admitting at      1:00  PM   Call this number if you have problems the morning of surgery [phone]   Do not eat food :After Midnight.   After Midnight you may have the following liquids until _0900_____ AM/  DAY OF SURGERY  then nothing by mouth  Water Non-Citrus Juices (without pulp, NO RED-Apple, White grape, White cranberry) Black Coffee (NO MILK/CREAM OR CREAMERS, sugar ok)  Clear Tea (NO MILK/CREAM OR CREAMERS, sugar ok) regular and decaf                                                                                                                                   Sports drinks like Gatorade (NO RED)                            If you have questions, please contact your surgeon's office.   FOLLOW  ANY ADDITIONAL PRE OP INSTRUCTIONS YOU RECEIVED FROM YOUR SURGEON'S OFFICE!!!     Oral Hygiene is also important to reduce your risk of infection.                                     Remember - BRUSH YOUR TEETH THE MORNING OF SURGERY WITH YOUR REGULAR TOOTHPASTE  DENTURES WILL BE REMOVED PRIOR TO SURGERY PLEASE DO NOT APPLY "Poly grip" OR ADHESIVES!!!   Do NOT smoke after Midnight   Stop all vitamins and herbal supplements 7 days before surgery.   Take these medicines the morning of surgery with A SIP OF WATER: tramadol or oxycodone and valium if needed , eye drops as usual  You may not have any metal on your body including hair pins, jewelry, and body piercing             Do not wear lotions, powders, perfumes/cologne, or deodorant               Men may shave face and neck.   Do not bring valuables to the hospital. [facility] IS NOT             RESPONSIBLE   FOR VALUABLES.   Contacts, glasses, dentures or bridgework may not be worn into surgery.   Bring small overnight bag day of surgery.   DO NOT BRING YOUR HOME MEDICATIONS TO THE HOSPITAL. PHARMACY WILL DISPENSE MEDICATIONS LISTED ON YOUR MEDICATION LIST TO YOU DURING YOUR ADMISSION IN THE HOSPITAL!    Patients discharged on the day of surgery will not be allowed to drive home.  Someone NEEDS to stay with you for the first 24 hours after anesthesia.   Special Instructions: Bring a copy of your healthcare power of attorney and living will documents the day of surgery if you haven't scanned them before.              Please read over the following fact sheets you were given: IF YOU HAVE QUESTIONS ABOUT YOUR PRE-OP INSTRUCTIONS PLEASE CALL [phone]    If you test positive for Covid or have been in contact with anyone that has tested positive in the last 10 days please notify you surgeon.    [facility] - Preparing for Surgery Before surgery, you can play an important role.  Because skin is not sterile, your skin needs to be as free of germs as possible.  You can reduce the number of germs on your skin by washing with CHG (chlorahexidine gluconate) soap before  surgery.  CHG is an antiseptic cleaner which kills germs and bonds with the skin to continue killing germs even after washing. Please DO NOT use if you have an allergy to CHG or antibacterial soaps.  If your skin becomes reddened/irritated stop using the CHG and inform your nurse when you arrive at Short Stay. Do not shave (including legs and underarms) for at least 48 hours prior to the first CHG shower.  You may shave your face/neck. Please follow these instructions carefully:  1.  Shower with CHG Soap the night before surgery and the  morning of Surgery.  2.  If you choose to wash your hair, wash your hair first as usual with your  normal  shampoo.  3.  After you shampoo, rinse your hair and body thoroughly to remove the  shampoo.                           4.  Use CHG as you would any other liquid soap.  You can apply chg directly  to the skin and wash                       Gently with a scrungie or clean washcloth.  5.  Apply the CHG Soap to your body ONLY FROM THE NECK DOWN.   Do not use on face/ open                           Wound or open sores. Avoid contact with eyes, ears mouth and genitals (private parts).  Wash face,  Genitals (private parts) with your normal soap.             6.  Wash thoroughly, paying special attention to the area where your surgery  will be performed.  7.  Thoroughly rinse your body with warm water from the neck down.  8.  DO NOT shower/wash with your normal soap after using and rinsing off  the CHG Soap.                9.  Pat yourself dry with a clean towel.            10.  Wear clean pajamas.            11.  Place clean sheets on your bed the night of your first shower and do not  sleep with pets. Day of Surgery : Do not apply any lotions/deodorants the morning of surgery.  Please wear clean clothes to the hospital/surgery center.  FAILURE TO FOLLOW THESE INSTRUCTIONS MAY RESULT IN THE CANCELLATION OF YOUR SURGERY PATIENT  SIGNATURE_________________________________  NURSE SIGNATURE__________________________________  ________________________________________________________________________

## 2023-06-05 NOTE — Progress Notes (Signed)
 PCP -  Cardiologist -   PPM/ICD -  Device Orders -  Rep Notified -   Chest x-ray -  EKG - 12-03-22 epic Stress Test -  ECHO -  Cardiac Cath -  LABS- CBC/diff 06-01-23 , CMP 06-02-23 epic   CBC,BMP, UA 06-05-23 epic  Sleep Study -  CPAP -   Fasting Blood Sugar -  Checks Blood Sugar _____ times a day  Blood Thinner Instructions: Aspirin Instructions:  ERAS Protcol - PRE-SURGERY n/a    COVID vaccine -  Activity--Can climb a flight of stairs without CP or SOB  Anesthesia review: Hx neck and throat cancer Difficult airway  Patient denies shortness of breath, fever, cough and chest pain at PAT appointment   All instructions explained to the patient, with a verbal understanding of the material. Patient agrees to go over the instructions while at home for a better understanding. Patient also instructed to self quarantine after being tested for COVID-19. The opportunity to ask questions was provided.

## 2023-06-06 ENCOUNTER — Encounter (HOSPITAL_COMMUNITY)
Admission: RE | Admit: 2023-06-06 | Discharge: 2023-06-06 | Disposition: A | Discharge status: Home / Self Care | Source: Ambulatory Visit | Attending: Urology | Admitting: Urology

## 2023-06-06 ENCOUNTER — Encounter (HOSPITAL_COMMUNITY): Payer: Self-pay

## 2023-06-06 ENCOUNTER — Other Ambulatory Visit: Payer: Self-pay

## 2023-06-06 VITALS — Ht 68.0 in | Wt 202.0 lb

## 2023-06-06 DIAGNOSIS — Z01818 Encounter for other preprocedural examination: Secondary | ICD-10-CM

## 2023-06-06 HISTORY — DX: Anxiety disorder, unspecified: F41.9

## 2023-06-06 HISTORY — DX: Failed or difficult intubation, initial encounter: T88.4XXA

## 2023-06-06 HISTORY — DX: Personal history of irradiation: Z92.3

## 2023-06-09 ENCOUNTER — Other Ambulatory Visit (HOSPITAL_COMMUNITY): Payer: Self-pay

## 2023-06-11 ENCOUNTER — Ambulatory Visit (HOSPITAL_BASED_OUTPATIENT_CLINIC_OR_DEPARTMENT_OTHER): Admitting: Physician Assistant

## 2023-06-11 ENCOUNTER — Encounter (HOSPITAL_COMMUNITY): Payer: Self-pay | Admitting: Urology

## 2023-06-11 ENCOUNTER — Encounter (HOSPITAL_COMMUNITY)
Admission: RE | Disposition: A | Discharge status: Home / Self Care | Payer: Self-pay | Source: Home / Self Care | Attending: Urology

## 2023-06-11 ENCOUNTER — Ambulatory Visit (HOSPITAL_COMMUNITY): Payer: Self-pay | Admitting: Physician Assistant

## 2023-06-11 ENCOUNTER — Other Ambulatory Visit: Payer: Self-pay

## 2023-06-11 ENCOUNTER — Observation Stay (HOSPITAL_COMMUNITY)
Admission: RE | Admit: 2023-06-11 | Discharge: 2023-06-12 | Disposition: A | Discharge status: Home / Self Care | Attending: Urology | Admitting: Urology

## 2023-06-11 ENCOUNTER — Observation Stay (HOSPITAL_COMMUNITY)

## 2023-06-11 DIAGNOSIS — C680 Malignant neoplasm of urethra: Secondary | ICD-10-CM

## 2023-06-11 DIAGNOSIS — N1330 Unspecified hydronephrosis: Secondary | ICD-10-CM | POA: Diagnosis not present

## 2023-06-11 DIAGNOSIS — F419 Anxiety disorder, unspecified: Secondary | ICD-10-CM

## 2023-06-11 DIAGNOSIS — C661 Malignant neoplasm of right ureter: Secondary | ICD-10-CM

## 2023-06-11 DIAGNOSIS — Z720 Tobacco use: Secondary | ICD-10-CM

## 2023-06-11 DIAGNOSIS — N2889 Other specified disorders of kidney and ureter: Secondary | ICD-10-CM

## 2023-06-11 DIAGNOSIS — C679 Malignant neoplasm of bladder, unspecified: Principal | ICD-10-CM | POA: Insufficient documentation

## 2023-06-11 DIAGNOSIS — Z01818 Encounter for other preprocedural examination: Secondary | ICD-10-CM

## 2023-06-11 HISTORY — PX: CYSTOSCOPY WITH BIOPSY: SHX5122

## 2023-06-11 SURGERY — CYSTOSCOPY, WITH BIOPSY
Anesthesia: General | Laterality: Right

## 2023-06-11 MED ORDER — SODIUM CHLORIDE 0.9 % IR SOLN
Status: DC | PRN
  Administered 2023-06-11: 1000 mL

## 2023-06-11 MED ORDER — FENTANYL CITRATE (PF) 100 MCG/2ML IJ SOLN
INTRAMUSCULAR | Status: AC
  Filled 2023-06-11: qty 2

## 2023-06-11 MED ORDER — KETOROLAC TROMETHAMINE 30 MG/ML IJ SOLN
30.0000 mg | Freq: Four times a day (QID) | INTRAMUSCULAR | Status: DC
  Administered 2023-06-11 – 2023-06-12 (×3): 30 mg via INTRAVENOUS
  Filled 2023-06-11 (×3): qty 1

## 2023-06-11 MED ORDER — OXYCODONE HCL 5 MG PO TABS: 5.0000 mg | ORAL_TABLET | Freq: Once | ORAL | Status: DC | PRN

## 2023-06-11 MED ORDER — SODIUM CHLORIDE 0.9 % IR SOLN
Status: DC | PRN
  Administered 2023-06-11 (×2): 3000 mL

## 2023-06-11 MED ORDER — SODIUM CHLORIDE 0.45 % IV SOLN: INTRAVENOUS | Status: DC

## 2023-06-11 MED ORDER — OXYCODONE HCL 5 MG PO TABS
5.0000 mg | ORAL_TABLET | ORAL | Status: DC | PRN
  Administered 2023-06-11: 5 mg via ORAL
  Filled 2023-06-11 (×2): qty 1

## 2023-06-11 MED ORDER — NICOTINE 21 MG/24HR TD PT24
21.0000 mg | MEDICATED_PATCH | Freq: Every day | TRANSDERMAL | Status: DC
Start: 2023-06-11 — End: 2023-06-12
  Administered 2023-06-11: 21 mg via TRANSDERMAL
  Filled 2023-06-11 (×2): qty 1

## 2023-06-11 MED ORDER — PROPOFOL 10 MG/ML IV BOLUS
INTRAVENOUS | Status: DC | PRN
  Administered 2023-06-11: 150 mg via INTRAVENOUS

## 2023-06-11 MED ORDER — HYDRALAZINE HCL 20 MG/ML IJ SOLN: 5.0000 mg | INTRAMUSCULAR | Status: DC | PRN

## 2023-06-11 MED ORDER — LATANOPROST 0.005 % OP SOLN
1.0000 [drp] | Freq: Every day | OPHTHALMIC | Status: DC
  Administered 2023-06-11: 1 [drp] via OPHTHALMIC
  Filled 2023-06-11: qty 2.5

## 2023-06-11 MED ORDER — ZOLPIDEM TARTRATE 5 MG PO TABS
5.0000 mg | ORAL_TABLET | Freq: Every evening | ORAL | Status: DC | PRN
  Filled 2023-06-11 (×2): qty 1

## 2023-06-11 MED ORDER — FENTANYL CITRATE PF 50 MCG/ML IJ SOSY
25.0000 ug | PREFILLED_SYRINGE | INTRAMUSCULAR | Status: DC | PRN
  Administered 2023-06-11: 50 ug via INTRAVENOUS

## 2023-06-11 MED ORDER — MIDAZOLAM HCL 2 MG/2ML IJ SOLN
INTRAMUSCULAR | Status: DC | PRN
  Administered 2023-06-11: 2 mg via INTRAVENOUS

## 2023-06-11 MED ORDER — DEXAMETHASONE SODIUM PHOSPHATE 10 MG/ML IJ SOLN
INTRAMUSCULAR | Status: DC | PRN
  Administered 2023-06-11: 10 mg via INTRAVENOUS

## 2023-06-11 MED ORDER — FENTANYL CITRATE PF 50 MCG/ML IJ SOSY
PREFILLED_SYRINGE | INTRAMUSCULAR | Status: AC
  Filled 2023-06-11: qty 1

## 2023-06-11 MED ORDER — BRIMONIDINE TARTRATE 0.2 % OP SOLN
1.0000 [drp] | Freq: Two times a day (BID) | OPHTHALMIC | Status: DC
  Administered 2023-06-11: 1 [drp] via OPHTHALMIC
  Filled 2023-06-11: qty 5

## 2023-06-11 MED ORDER — ONDANSETRON HCL 4 MG/2ML IJ SOLN
INTRAMUSCULAR | Status: DC | PRN
  Administered 2023-06-11: 4 mg via INTRAVENOUS

## 2023-06-11 MED ORDER — DIAZEPAM 5 MG PO TABS
10.0000 mg | ORAL_TABLET | Freq: Three times a day (TID) | ORAL | Status: DC | PRN
Start: 2023-06-11 — End: 2023-06-12
  Administered 2023-06-11: 10 mg via ORAL
  Filled 2023-06-11 (×2): qty 2

## 2023-06-11 MED ORDER — ORAL CARE MOUTH RINSE: 15.0000 mL | OROMUCOSAL | Status: DC | PRN

## 2023-06-11 MED ORDER — CHLORHEXIDINE GLUCONATE 0.12 % MT SOLN
15.0000 mL | Freq: Once | OROMUCOSAL | Status: AC
  Administered 2023-06-11: 15 mL via OROMUCOSAL

## 2023-06-11 MED ORDER — BRIMONIDINE TARTRATE-TIMOLOL 0.2-0.5 % OP SOLN
1.0000 [drp] | Freq: Two times a day (BID) | OPHTHALMIC | Status: DC
Start: 2023-06-11 — End: 2023-06-11

## 2023-06-11 MED ORDER — FENTANYL CITRATE (PF) 250 MCG/5ML IJ SOLN
INTRAMUSCULAR | Status: DC | PRN
  Administered 2023-06-11 (×2): 25 ug via INTRAVENOUS
  Administered 2023-06-11: 50 ug via INTRAVENOUS
  Administered 2023-06-11: 25 ug via INTRAVENOUS

## 2023-06-11 MED ORDER — TIMOLOL MALEATE 0.5 % OP SOLN
1.0000 [drp] | Freq: Two times a day (BID) | OPHTHALMIC | Status: DC
  Administered 2023-06-11: 1 [drp] via OPHTHALMIC
  Filled 2023-06-11: qty 5

## 2023-06-11 MED ORDER — ACETAMINOPHEN 10 MG/ML IV SOLN
INTRAVENOUS | Status: AC
  Filled 2023-06-11: qty 100

## 2023-06-11 MED ORDER — PROPOFOL 10 MG/ML IV BOLUS
INTRAVENOUS | Status: AC
  Filled 2023-06-11: qty 20

## 2023-06-11 MED ORDER — ACETAMINOPHEN 10 MG/ML IV SOLN
1000.0000 mg | Freq: Once | INTRAVENOUS | Status: DC | PRN
  Administered 2023-06-11: 1000 mg via INTRAVENOUS

## 2023-06-11 MED ORDER — HYDROMORPHONE HCL 1 MG/ML IJ SOLN
0.5000 mg | INTRAMUSCULAR | Status: DC | PRN
  Administered 2023-06-11 – 2023-06-12 (×4): 0.5 mg via INTRAVENOUS
  Filled 2023-06-11 (×4): qty 0.5

## 2023-06-11 MED ORDER — BRIMONIDINE TARTRATE 0.2 % OP SOLN: 1.0000 [drp] | Freq: Three times a day (TID) | OPHTHALMIC | Status: DC

## 2023-06-11 MED ORDER — ACETAMINOPHEN 10 MG/ML IV SOLN
1000.0000 mg | Freq: Four times a day (QID) | INTRAVENOUS | Status: DC
  Administered 2023-06-11 – 2023-06-12 (×2): 1000 mg via INTRAVENOUS
  Filled 2023-06-11 (×2): qty 100

## 2023-06-11 MED ORDER — DROPERIDOL 2.5 MG/ML IJ SOLN: 0.6250 mg | Freq: Once | INTRAMUSCULAR | Status: DC | PRN

## 2023-06-11 MED ORDER — STERILE WATER FOR IRRIGATION IR SOLN
Status: DC | PRN
Start: 2023-06-11 — End: 2023-06-11
  Administered 2023-06-11 (×2): 3000 mL

## 2023-06-11 MED ORDER — LACTATED RINGERS IV SOLN: INTRAVENOUS | Status: DC

## 2023-06-11 MED ORDER — LIDOCAINE 2% (20 MG/ML) 5 ML SYRINGE
INTRAMUSCULAR | Status: DC | PRN
  Administered 2023-06-11: 60 mg via INTRAVENOUS

## 2023-06-11 MED ORDER — OXYCODONE HCL 5 MG/5ML PO SOLN: 5.0000 mg | Freq: Once | ORAL | Status: DC | PRN

## 2023-06-11 MED ORDER — PHENAZOPYRIDINE HCL 200 MG PO TABS
200.0000 mg | ORAL_TABLET | Freq: Three times a day (TID) | ORAL | Status: DC
  Filled 2023-06-11 (×2): qty 1

## 2023-06-11 MED ORDER — MIDAZOLAM HCL 2 MG/2ML IJ SOLN
INTRAMUSCULAR | Status: AC
  Filled 2023-06-11: qty 2

## 2023-06-11 MED ORDER — DORZOLAMIDE HCL 2 % OP SOLN
1.0000 [drp] | Freq: Three times a day (TID) | OPHTHALMIC | Status: DC
  Administered 2023-06-11: 1 [drp] via OPHTHALMIC
  Filled 2023-06-11: qty 10

## 2023-06-11 MED ORDER — TIMOLOL MALEATE 0.25 % OP SOLN: 1.0000 [drp] | Freq: Every day | OPHTHALMIC | Status: DC

## 2023-06-11 MED ORDER — FENTANYL CITRATE (PF) 100 MCG/2ML IJ SOLN
INTRAMUSCULAR | Status: AC
Start: 2023-06-11 — End: ?
  Filled 2023-06-11: qty 2

## 2023-06-11 MED ORDER — PHENYLEPHRINE 80 MCG/ML (10ML) SYRINGE FOR IV PUSH (FOR BLOOD PRESSURE SUPPORT)
PREFILLED_SYRINGE | INTRAVENOUS | Status: DC | PRN
Start: 2023-06-11 — End: 2023-06-11
  Administered 2023-06-11 (×4): 80 ug via INTRAVENOUS
  Administered 2023-06-11: 160 ug via INTRAVENOUS

## 2023-06-11 MED ORDER — IOHEXOL 300 MG/ML  SOLN
INTRAMUSCULAR | Status: DC | PRN
  Administered 2023-06-11: 10 mL

## 2023-06-11 MED ORDER — ORAL CARE MOUTH RINSE: 15.0000 mL | Freq: Once | OROMUCOSAL | Status: AC

## 2023-06-11 MED ORDER — CIPROFLOXACIN IN D5W 400 MG/200ML IV SOLN
400.0000 mg | INTRAVENOUS | Status: AC
  Administered 2023-06-11: 400 mg via INTRAVENOUS
  Filled 2023-06-11: qty 200

## 2023-06-11 SURGICAL SUPPLY — 32 items
BAG COUNTER SPONGE SURGICOUNT (BAG) IMPLANT
BAG URO CATCHER STRL LF (MISCELLANEOUS) ×1 IMPLANT
BASKET ZERO TIP NITINOL 2.4FR (BASKET) IMPLANT
BRUSH URET BIOPSY 3F (UROLOGICAL SUPPLIES) IMPLANT
CATH URETERAL DUAL LUMEN 10F (MISCELLANEOUS) IMPLANT
CATH URETL OPEN 5X70 (CATHETERS) ×1 IMPLANT
CLOTH BEACON ORANGE TIMEOUT ST (SAFETY) ×1 IMPLANT
DRAPE FOOT SWITCH (DRAPES) ×1 IMPLANT
ELECT REM PT RETURN 15FT ADLT (MISCELLANEOUS) ×1 IMPLANT
EXTRACTOR STONE 1.7FRX115CM (UROLOGICAL SUPPLIES) IMPLANT
GLOVE SURG LX STRL 7.5 STRW (GLOVE) ×1 IMPLANT
GOWN STRL REUS W/ TWL XL LVL3 (GOWN DISPOSABLE) ×1 IMPLANT
GUIDEWIRE ANG ZIPWIRE 038X150 (WIRE) IMPLANT
GUIDEWIRE STR DUAL SENSOR (WIRE) ×1 IMPLANT
KIT BALLN UROMAX 15FX4 (MISCELLANEOUS) IMPLANT
KIT TURNOVER KIT A (KITS) IMPLANT
LASER FIB FLEXIVA PULSE ID 365 (Laser) IMPLANT
LASER FIB FLEXIVA PULSE ID 550 (Laser) IMPLANT
LASER FIB FLEXIVA PULSE ID 910 (Laser) IMPLANT
LOOP CUT BIPOLAR 24F LRG (ELECTROSURGICAL) IMPLANT
MANIFOLD NEPTUNE II (INSTRUMENTS) ×1 IMPLANT
NDL SAFETY ECLIPSE 18X1.5 (NEEDLE) ×1 IMPLANT
NS IRRIG 1000ML POUR BTL (IV SOLUTION) IMPLANT
PACK CYSTO (CUSTOM PROCEDURE TRAY) ×1 IMPLANT
SHEATH NAVIGATOR HD 12/14X28 (SHEATH) IMPLANT
SHEATH NAVIGATOR HD 12/14X36 (SHEATH) IMPLANT
STENT URET 6FRX26 CONTOUR (STENTS) IMPLANT
SYR TOOMEY IRRIG 70ML (MISCELLANEOUS) IMPLANT
SYRINGE TOOMEY IRRIG 70ML (MISCELLANEOUS) IMPLANT
TRACTIP FLEXIVA PULS ID 200XHI (Laser) IMPLANT
TUBING CONNECTING 10 (TUBING) ×1 IMPLANT
TUBING UROLOGY SET (TUBING) ×1 IMPLANT

## 2023-06-11 NOTE — Transfer of Care (Signed)
 Immediate Anesthesia Transfer of Care Note  Patient: Glen Anis Sr.  Procedure(s) Performed: CYSTOSCOPY, WITH BIOPSY (Right) CYSTOURETEROSCOPY, WITH RETROGRADE PYELOGRAM AND STENT INSERTION (Right) CYSTOSCOPY, FLEXIBLE, WITH STENT REPLACEMENT (Right) CYSTOSCOPY, WITH RETROGRADE PYELOGRAM (Right)  Patient Location: PACU  Anesthesia Type:General  Level of Consciousness: drowsy  Airway & Oxygen Therapy: Patient Spontanous Breathing and Patient connected to nasal cannula oxygen  Post-op Assessment: Report given to RN and Post -op Vital signs reviewed and stable  Post vital signs: Reviewed and stable  Last Vitals:  Vitals Value Taken Time  BP 149/105 06/11/23 1701  Temp    Pulse 70 06/11/23 1703  Resp 14 06/11/23 1703  SpO2 100 % 06/11/23 1703  Vitals shown include unfiled device data.  Last Pain:  Vitals:   06/11/23 1311  TempSrc: Oral  PainSc:          Complications: No notable events documented.

## 2023-06-11 NOTE — H&P (Signed)
 58 year old man who initially presented with a left proximal ureteral stone and right-sided hydronephrosis. He was subsequently diagnosed with a right ureteral mass. 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 1 month later we went back to the operating room for a 1.5 x 1.5 cm with several tumors along the course of the ureter. The tumors were lasered off and then lassoed. Was difficult to know if they have been completely excised.   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 Patient scheduled to go radiation and chemotherapy for his tonsillar cancer with the expectation that he will be through his treatment early November.   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   6/24: Taken the operating room again for reevaluation of the right ureter which demonstrated a smaller lesion, but ever present. Both proximal and distal to this appeared to be uninvolved. The lesion measured approximately 1 cm. Difficult to know whether that was completely excised, but it was treated with laser ablation.   9/24: The patient was again taken to the operating room and the area around the right ureteral tumor was reevaluated. Demonstrated no evidence of ongoing disease. I did remove a small stone in his right kidney. The stent was exchanged. 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   Interval: The patient is here today for follow-up and to discuss stent exchange. It was last exchanged in September 2024. Since the patient was last here he has been working in [employer], somewhat lost to follow-up. Has plans to get his tonsillar cancer treated in [place].     ALLERGIES: No Allergies    MEDICATIONS: oxyCODONE HCl 5 MG Capsule  1 capsule PO QID PRN  Cefdinir  IBU 800 MG Tablet  levoFLOXacin 500 MG Tablet  oxyBUTYnin Chloride ER 10 MG Tablet Extended Release 24 Hour  oxyCODONE HCl 5 MG Tablet 1 tab PO Q6 PRN post-op pain     GU PSH: Cysto Uretero Biopsy Fulgura - 01/09/2022 Cystoscopy Insert Stent - 01/09/2022     NON-GU PSH: No Non-GU PSH    GU PMH: Epididymitis - 11/05/2022 Right ureteral cancer - 11/05/2022, - 09/13/2022, - 08/01/2022, - 04/02/2022, - 01/31/2022 Ureteral calculus - 01/31/2022    NON-GU PMH: No Non-GU PMH    FAMILY HISTORY: No Family History    SOCIAL HISTORY: No Social History    REVIEW OF SYSTEMS:    GU Review Male:   Patient denies frequent urination, hard to postpone urination, burning/ pain with urination, get up at night to urinate, leakage of urine, stream starts and stops, trouble starting your stream, have to strain to urinate , erection problems, and penile pain.  Gastrointestinal (Upper):   Patient denies nausea, vomiting, and indigestion/ heartburn.  Gastrointestinal (Lower):   Patient denies constipation and diarrhea.  Constitutional:   Patient denies fever, night sweats, weight loss, and fatigue.  Skin:   Patient denies skin rash/ lesion and itching.  Eyes:   Patient denies blurred vision and double vision.  Ears/ Nose/ Throat:   Patient denies sore throat and sinus problems.  Hematologic/Lymphatic:   Patient denies swollen glands and easy bruising.  Cardiovascular:   Patient denies leg swelling and chest pains.  Respiratory:   Patient denies cough and shortness of breath.  Endocrine:   Patient denies excessive thirst.  Musculoskeletal:   Patient denies back pain and joint pain.  Neurological:   Patient denies headaches and dizziness.  Psychologic:   Patient denies depression and anxiety.   VITAL SIGNS:      06/02/2023 08:38 AM  Weight 202 lb / 91.63 kg  BP 125/84 mmHg  Pulse 80 /min  Temperature 97.5 F / 36.3 C   MULTI-SYSTEM PHYSICAL EXAMINATION:    Constitutional:  Well-nourished. No physical deformities. Normally developed. Good grooming.  Respiratory: Normal breath sounds. No labored breathing, no use of accessory muscles.   Cardiovascular: Regular rate and rhythm. No murmur, no gallop. Normal temperature, normal extremity pulses, no swelling, no varicosities.      Complexity of Data:  Source Of History:  Patient  Records Review:   Pathology Reports, Previous Doctor Records, Previous Patient Records, POC Tool  Urine Test Review:   Urinalysis   PROCEDURES:          Visit Complexity - G2211          Urinalysis w/Scope - 81001 Dipstick Dipstick Cont'd Micro  Color: Yellow Bilirubin: Neg WBC/hpf: 0 - 5/hpf  Appearance: Slightly Cloudy Ketones: Neg RBC/hpf: 10 - 20/hpf  Specific Gravity: 1.025 Blood: 3+ Bacteria: Few (10-25/hpf)  pH: 6.0 Protein: Trace Cystals: NS (Not Seen)  Glucose: Neg Urobilinogen: 0.2 Casts: NS (Not Seen)    Nitrites: Neg Trichomonas: Not Present    Leukocyte Esterase: Trace Mucous: Present      Epithelial Cells: 0 - 5/hpf      Yeast: NS (Not Seen)      Sperm: Not Present    Notes:      ASSESSMENT:      ICD-10 Details  1 GU:   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 The patient needs re-evaluation of his right ureter as well as his urethra. If amendable, and there was no evidence of disease/stricture, we'll try and take his right ureteral stent out. He is anxious to get this scheduled. We'll do our best to accommodate his request.

## 2023-06-11 NOTE — Discharge Instructions (Signed)
 DISCHARGE INSTRUCTIONS FOR KIDNEY STONE/URETERAL STENT   MEDICATIONS:  1.  Resume all your other meds from home - except do not take any extra narcotic pain meds that you may have at home.    ACTIVITY:  1. No strenuous activity x 1week  2. No driving while on narcotic pain medications  3. Drink plenty of water  4. Continue to walk at home - you can still get blood clots when you are at home, so keep active, but don't over do it.  5. May return to work/school tomorrow or when you feel ready   BATHING:  1. You can shower and we recommend daily showers   SIGNS/SYMPTOMS TO CALL:  Please call us if you have a fever greater than 101.5, uncontrolled nausea/vomiting, uncontrolled pain, dizziness, unable to urinate, bloody urine, chest pain, shortness of breath, leg swelling, leg pain, redness around wound, drainage from wound, or any other concerns or questions.   You can reach [place] at [phone].

## 2023-06-11 NOTE — Anesthesia Preprocedure Evaluation (Addendum)
 Anesthesia Evaluation  Patient identified by MRN, date of birth, ID band Patient awake    Reviewed: Allergy & Precautions, NPO status , Patient's Chart, lab work & pertinent test results  History of Anesthesia Complications (+) history of anesthetic complications  Airway Mallampati: I  TM Distance: >3 FB Neck ROM: Full    Dental  (+) Edentulous Upper, Dental Advisory Given   Pulmonary Current Smoker   breath sounds clear to auscultation       Cardiovascular negative cardio ROS  Rhythm:Regular Rate:Normal     Neuro/Psych   Anxiety      Neuromuscular disease    GI/Hepatic negative GI ROS, Neg liver ROS,,,  Endo/Other  negative endocrine ROS    Renal/GU Renal disease     Musculoskeletal negative musculoskeletal ROS (+)    Abdominal   Peds  Hematology negative hematology ROS (+)   Anesthesia Other Findings   Reproductive/Obstetrics                             Anesthesia Physical Anesthesia Plan  ASA: 3  Anesthesia Plan: General   Post-op Pain Management: Tylenol PO (pre-op)*   Induction: Intravenous  PONV Risk Score and Plan: 2 and Ondansetron and Midazolam  Airway Management Planned: Oral ETT and Video Laryngoscope Planned  Additional Equipment: None  Intra-op Plan:   Post-operative Plan: Extubation in OR  Informed Consent: I have reviewed the patients History and Physical, chart, labs and discussed the procedure including the risks, benefits and alternatives for the proposed anesthesia with the patient or authorized representative who has indicated his/her understanding and acceptance.     Dental advisory given  Plan Discussed with: CRNA  Anesthesia Plan Comments:        Anesthesia Quick Evaluation

## 2023-06-11 NOTE — Anesthesia Postprocedure Evaluation (Addendum)
 Anesthesia Post Note  Patient: Glen STANISLAWSKI Sr.  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; Right ureteral brush biopsy; Right ureteral stent exchange (Right)     Patient location during evaluation: PACU Anesthesia Type: General Level of consciousness: awake and alert Pain management: pain level controlled Vital Signs Assessment: post-procedure vital signs reviewed and stable Respiratory status: spontaneous breathing, nonlabored ventilation, respiratory function stable and patient connected to nasal cannula oxygen Cardiovascular status: blood pressure returned to baseline and stable Postop Assessment: no apparent nausea or vomiting Anesthetic complications: no  No notable events documented.  Last Vitals:  Vitals:   06/11/23 1800 06/11/23 1815  BP: 120/89 132/86  Pulse: 75 75  Resp: 16 16  Temp:    SpO2: 100% 98%                 Shelton Silvas

## 2023-06-11 NOTE — Anesthesia Procedure Notes (Signed)
 Procedure Name: LMA Insertion Date/Time: 06/11/2023 3:34 PM  Performed by: Dairl Ponder, CRNAPre-anesthesia Checklist: Patient identified, Emergency Drugs available, Suction available and Patient being monitored Patient Re-evaluated:Patient Re-evaluated prior to induction Oxygen Delivery Method: Circle System Utilized Preoxygenation: Pre-oxygenation with 100% oxygen Induction Type: IV induction Ventilation: Mask ventilation without difficulty LMA: LMA inserted LMA Size: 4.0 Number of attempts: 1 Airway Equipment and Method: Bite block Placement Confirmation: positive ETCO2 Tube secured with: Tape Dental Injury: Teeth and Oropharynx as per pre-operative assessment

## 2023-06-11 NOTE — Op Note (Signed)
 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  There were 2 other very small frondular attachments along the urethra and the bulbous well.  These were both removed. 2.:  Within the patient's bladder there were at least 4 maybe 5 areas of small superficial appearing tumors.  These were removed with the rigid biopsy forceps and then the base of these areas fulgurated.  In addition, there was a 5 to 10 mm lesion at the bladder neck at the 9 o'clock position which was also removed with the rigid biopsy forceps and then fulgurated. #3: [name] retrograde pyelogram demonstrated a stenosis or stricture in the mid ureter.  There was no other appreciable filling defects.  There is no hydro ureter nephrosis. #4: In the right mid ureter there was a stricture that I was unable to pass with the single-lumen flexible ureteroscope.  As a result, I balloon dilated this area with a 15 French x 4 cm balloon dilator.  I inspected the area and did not see any real areas of recurrence around this area specifically.  I then explored the renal pelvis and did not encounter any additional tumor.  I did take a brush biopsy of the area around the stricture. #5: A 26 cm time 6 French double-J stent was exchanged.  EBL: Minimal  Specimens:  #1: Urethral mass biopsy #2: Posterior bladder mass biopsies #3: Bladder neck biopsy #4: Right ureteral brush  biopsy  Indication: Glen Anis Sr. is a 58 y.o. patient with history of right low-grade noninvasive ureteral cancer and superficial low-grade urethral cancer.  After reviewing the management options for treatment, he elected to proceed with the above surgical procedure(s). We have discussed the potential benefits and risks of the procedure, side effects of the proposed treatment, the likelihood of the patient achieving the goals of the procedure, and any potential problems that might occur during the procedure or recuperation. Informed consent has been obtained.  Description of procedure:  Consent was obtained the preoperative holding area.  He was brought back to the room placed on the table in supine position.  General esthesia was induced and endotracheal tube was inserted.  He was placed in dorsolithotomy position and prepped and draped in the routine sterile fashion.  Time was subsequently performed.  I then advanced a 21 French surgery cystoscope to the patient's urethra.  Urethroscopy demonstrated the above findings.  I used the rigid biopsy forceps and was able to pinch the tumor off the sidewall.  There were 2 additional frondular attachments to the urothelium that I also removed.  These were sent for specimen.  I then continued into the patient's bladder and upon inspection found the above findings.  Using the rigid biopsy forceps I biopsied and removed all these areas that I encountered.  I then fulgurated the area and achieved excellent hemostasis.  I then crossed this stent emanating from the patient's right ureteral orifice and pulled it down to the urethral meatus.  I advanced a wire through the stent and  up into the right renal pelvis.  I subsequently removed the stent over the wire.  I then advanced a dual-lumen catheter over the wire into the distal ureter and performed a right retrograde pyelogram with the above findings.  I then advanced a second wire up through the dual-lumen catheter  removing the catheter over the wire.  I then did advanced a flexible ureteroscope over the second wire and into the proximal ureter.  I then remove the wire and performed ureteroscopy with the above findings.  When I got to the strictured area I was unable to pass it passively.  I subsequently repassed the wire and remove the scope over the wire.  I then advanced a 5 cm time 16 French balloon dilator over the wire and across this area.  I inflated the balloon and left the balloon dilated for about 90 seconds before taking it down and removing the balloon dilator.  I then advanced the flexible ureteroscope back over the wire into this area removing the wire.  I was able to then navigate beyond the strictured area and into the renal pelvis.  I performed pyeloscopy using fluoroscopic guidance.  I inspected all calyces and noted no additional tumors.  I then slowly backed out the ureteroscope again inspecting the urothelium.  Once I got to the strictured area I opted at this point to perform a brush biopsy.  I brush the specimen along the strictured area for about 4 5 cm.  I then remove the brush biopsy and the scope simultaneously noting no additional ureteral findings.  The brush biopsy was then sent for further evaluation.  I then advanced the 26 cm time 6 French double-J ureteral stent over the safety wire and into the right renal pelvis under fluoroscopic guidance.  Once it was noted to be well within the renal pelvis I advanced the stent pusher to the pubic rami before removing the stent tether and then the wire altogether.  A nice curl was noted within the bladder as well as in the renal pelvis.  I then repassed the cystoscope into the patient's bladder noting good hemostasis.  I emptied the patient's bladder the case was terminated.  The patient subsequently extubated returned the PACU in stable condition.

## 2023-06-12 ENCOUNTER — Encounter (HOSPITAL_COMMUNITY): Payer: Self-pay | Admitting: Urology

## 2023-06-12 ENCOUNTER — Emergency Department (HOSPITAL_COMMUNITY)

## 2023-06-12 ENCOUNTER — Inpatient Hospital Stay (HOSPITAL_COMMUNITY)
Admission: EM | Admit: 2023-06-12 | Discharge: 2023-06-14 | DRG: 369 | Disposition: A | Discharge status: Home / Self Care | Attending: Internal Medicine | Admitting: Internal Medicine

## 2023-06-12 ENCOUNTER — Other Ambulatory Visit: Payer: Self-pay

## 2023-06-12 DIAGNOSIS — K2101 Gastro-esophageal reflux disease with esophagitis, with bleeding: Secondary | ICD-10-CM | POA: Diagnosis present

## 2023-06-12 DIAGNOSIS — Z833 Family history of diabetes mellitus: Secondary | ICD-10-CM | POA: Diagnosis not present

## 2023-06-12 DIAGNOSIS — G8929 Other chronic pain: Secondary | ICD-10-CM | POA: Diagnosis present

## 2023-06-12 DIAGNOSIS — E66811 Obesity, class 1: Secondary | ICD-10-CM | POA: Diagnosis present

## 2023-06-12 DIAGNOSIS — J9811 Atelectasis: Secondary | ICD-10-CM | POA: Diagnosis present

## 2023-06-12 DIAGNOSIS — Z8249 Family history of ischemic heart disease and other diseases of the circulatory system: Secondary | ICD-10-CM | POA: Diagnosis not present

## 2023-06-12 DIAGNOSIS — D62 Acute posthemorrhagic anemia: Secondary | ICD-10-CM | POA: Diagnosis present

## 2023-06-12 DIAGNOSIS — K2100 Gastro-esophageal reflux disease with esophagitis, without bleeding: Secondary | ICD-10-CM | POA: Diagnosis present

## 2023-06-12 DIAGNOSIS — K920 Hematemesis: Secondary | ICD-10-CM | POA: Diagnosis present

## 2023-06-12 DIAGNOSIS — M5430 Sciatica, unspecified side: Secondary | ICD-10-CM | POA: Diagnosis present

## 2023-06-12 DIAGNOSIS — Z87442 Personal history of urinary calculi: Secondary | ICD-10-CM

## 2023-06-12 DIAGNOSIS — R948 Abnormal results of function studies of other organs and systems: Secondary | ICD-10-CM

## 2023-06-12 DIAGNOSIS — Z970 Presence of artificial eye: Secondary | ICD-10-CM

## 2023-06-12 DIAGNOSIS — F17210 Nicotine dependence, cigarettes, uncomplicated: Secondary | ICD-10-CM | POA: Diagnosis present

## 2023-06-12 DIAGNOSIS — K921 Melena: Secondary | ICD-10-CM

## 2023-06-12 DIAGNOSIS — Z6830 Body mass index (BMI) 30.0-30.9, adult: Secondary | ICD-10-CM

## 2023-06-12 DIAGNOSIS — H409 Unspecified glaucoma: Secondary | ICD-10-CM | POA: Diagnosis present

## 2023-06-12 DIAGNOSIS — H548 Legal blindness, as defined in USA: Secondary | ICD-10-CM | POA: Diagnosis present

## 2023-06-12 DIAGNOSIS — R066 Hiccough: Secondary | ICD-10-CM | POA: Diagnosis present

## 2023-06-12 DIAGNOSIS — F419 Anxiety disorder, unspecified: Secondary | ICD-10-CM | POA: Diagnosis present

## 2023-06-12 DIAGNOSIS — Z79899 Other long term (current) drug therapy: Secondary | ICD-10-CM

## 2023-06-12 DIAGNOSIS — C099 Malignant neoplasm of tonsil, unspecified: Secondary | ICD-10-CM | POA: Diagnosis present

## 2023-06-12 DIAGNOSIS — C679 Malignant neoplasm of bladder, unspecified: Secondary | ICD-10-CM | POA: Diagnosis present

## 2023-06-12 DIAGNOSIS — F17290 Nicotine dependence, other tobacco product, uncomplicated: Secondary | ICD-10-CM | POA: Diagnosis present

## 2023-06-12 DIAGNOSIS — C661 Malignant neoplasm of right ureter: Secondary | ICD-10-CM | POA: Diagnosis present

## 2023-06-12 DIAGNOSIS — Z923 Personal history of irradiation: Secondary | ICD-10-CM | POA: Diagnosis not present

## 2023-06-12 DIAGNOSIS — K222 Esophageal obstruction: Secondary | ICD-10-CM | POA: Diagnosis not present

## 2023-06-12 DIAGNOSIS — R933 Abnormal findings on diagnostic imaging of other parts of digestive tract: Secondary | ICD-10-CM

## 2023-06-12 DIAGNOSIS — K1379 Other lesions of oral mucosa: Principal | ICD-10-CM | POA: Diagnosis present

## 2023-06-12 DIAGNOSIS — K449 Diaphragmatic hernia without obstruction or gangrene: Secondary | ICD-10-CM | POA: Diagnosis present

## 2023-06-12 DIAGNOSIS — R1030 Lower abdominal pain, unspecified: Secondary | ICD-10-CM | POA: Diagnosis present

## 2023-06-12 LAB — TYPE AND SCREEN
ABO/RH(D): A POS
Antibody Screen: NEGATIVE

## 2023-06-12 LAB — CBC
HCT: 39.8 % (ref 39.0–52.0)
Hemoglobin: 12.8 g/dL — ABNORMAL LOW (ref 13.0–17.0)
MCH: 30.5 pg (ref 26.0–34.0)
MCHC: 32.2 g/dL (ref 30.0–36.0)
MCV: 95 fL (ref 80.0–100.0)
Platelets: 187 10*3/uL (ref 150–400)
RBC: 4.19 MIL/uL — ABNORMAL LOW (ref 4.22–5.81)
RDW: 13.7 % (ref 11.5–15.5)
WBC: 9.9 10*3/uL (ref 4.0–10.5)
nRBC: 0 % (ref 0.0–0.2)

## 2023-06-12 LAB — BASIC METABOLIC PANEL WITH GFR
Anion gap: 9 (ref 5–15)
BUN: 31 mg/dL — ABNORMAL HIGH (ref 6–20)
CO2: 28 mmol/L (ref 22–32)
Calcium: 9 mg/dL (ref 8.9–10.3)
Chloride: 100 mmol/L (ref 98–111)
Creatinine, Ser: 1.23 mg/dL (ref 0.61–1.24)
GFR, Estimated: >60 mL/min (ref >60)
Glucose, Bld: 164 mg/dL — ABNORMAL HIGH (ref 70–99)
Potassium: 4.2 mmol/L (ref 3.5–5.1)
Sodium: 137 mmol/L (ref 135–145)

## 2023-06-12 LAB — CBC WITH DIFFERENTIAL/PLATELET
Abs Immature Granulocytes: 0.06 10*3/uL (ref 0.00–0.07)
Basophils Absolute: 0 10*3/uL (ref 0.0–0.1)
Basophils Relative: 0 %
Eosinophils Absolute: 0 10*3/uL (ref 0.0–0.5)
Eosinophils Relative: 0 %
HCT: 45 % (ref 39.0–52.0)
Hemoglobin: 14.5 g/dL (ref 13.0–17.0)
Immature Granulocytes: 1 %
Lymphocytes Relative: 10 %
Lymphs Abs: 1.2 10*3/uL (ref 0.7–4.0)
MCH: 30.4 pg (ref 26.0–34.0)
MCHC: 32.2 g/dL (ref 30.0–36.0)
MCV: 94.3 fL (ref 80.0–100.0)
Monocytes Absolute: 0.7 10*3/uL (ref 0.1–1.0)
Monocytes Relative: 6 %
Neutro Abs: 9.6 10*3/uL — ABNORMAL HIGH (ref 1.7–7.7)
Neutrophils Relative %: 83 %
Platelets: 186 10*3/uL (ref 150–400)
RBC: 4.77 MIL/uL (ref 4.22–5.81)
RDW: 13.6 % (ref 11.5–15.5)
WBC: 11.6 10*3/uL — ABNORMAL HIGH (ref 4.0–10.5)
nRBC: 0 % (ref 0.0–0.2)

## 2023-06-12 LAB — COMPREHENSIVE METABOLIC PANEL WITH GFR
ALT: 13 U/L (ref 0–44)
AST: 14 U/L — ABNORMAL LOW (ref 15–41)
Albumin: 3.7 g/dL (ref 3.5–5.0)
Alkaline Phosphatase: 51 U/L (ref 38–126)
Anion gap: 9 (ref 5–15)
BUN: 41 mg/dL — ABNORMAL HIGH (ref 6–20)
CO2: 28 mmol/L (ref 22–32)
Calcium: 8.8 mg/dL — ABNORMAL LOW (ref 8.9–10.3)
Chloride: 100 mmol/L (ref 98–111)
Creatinine, Ser: 1.17 mg/dL (ref 0.61–1.24)
GFR, Estimated: >60 mL/min (ref >60)
Glucose, Bld: 175 mg/dL — ABNORMAL HIGH (ref 70–99)
Potassium: 3.4 mmol/L — ABNORMAL LOW (ref 3.5–5.1)
Sodium: 137 mmol/L (ref 135–145)
Total Bilirubin: 1 mg/dL (ref 0.0–1.2)
Total Protein: 6.7 g/dL (ref 6.5–8.1)

## 2023-06-12 LAB — ABO/RH: ABO/RH(D): A POS

## 2023-06-12 MED ORDER — BRIMONIDINE TARTRATE 0.2 % OP SOLN
1.0000 [drp] | Freq: Three times a day (TID) | OPHTHALMIC | Status: DC
  Administered 2023-06-13 – 2023-06-14 (×4): 1 [drp] via OPHTHALMIC
  Filled 2023-06-12: qty 5

## 2023-06-12 MED ORDER — MELATONIN 3 MG PO TABS: 6.0000 mg | ORAL_TABLET | Freq: Every evening | ORAL | Status: DC | PRN

## 2023-06-12 MED ORDER — TIMOLOL MALEATE 0.5 % OP SOLN
1.0000 [drp] | Freq: Every day | OPHTHALMIC | Status: DC
  Administered 2023-06-13 – 2023-06-14 (×2): 1 [drp] via OPHTHALMIC
  Filled 2023-06-12: qty 5

## 2023-06-12 MED ORDER — PANTOPRAZOLE SODIUM 40 MG IV SOLR
40.0000 mg | Freq: Two times a day (BID) | INTRAVENOUS | Status: DC
  Administered 2023-06-12 – 2023-06-13 (×3): 40 mg via INTRAVENOUS
  Filled 2023-06-12 (×3): qty 10

## 2023-06-12 MED ORDER — LATANOPROST 0.005 % OP SOLN
1.0000 [drp] | Freq: Every day | OPHTHALMIC | Status: DC
  Administered 2023-06-13: 1 [drp] via OPHTHALMIC
  Filled 2023-06-12: qty 2.5

## 2023-06-12 MED ORDER — ACETAMINOPHEN 500 MG PO TABS: 1000.0000 mg | ORAL_TABLET | Freq: Four times a day (QID) | ORAL | Status: DC | PRN

## 2023-06-12 MED ORDER — NICOTINE 21 MG/24HR TD PT24
21.0000 mg | MEDICATED_PATCH | Freq: Every day | TRANSDERMAL | Status: DC
  Administered 2023-06-12 – 2023-06-14 (×3): 21 mg via TRANSDERMAL
  Filled 2023-06-12 (×3): qty 1

## 2023-06-12 MED ORDER — DORZOLAMIDE HCL 2 % OP SOLN
1.0000 [drp] | Freq: Three times a day (TID) | OPHTHALMIC | Status: DC
  Administered 2023-06-13 – 2023-06-14 (×4): 1 [drp] via OPHTHALMIC
  Filled 2023-06-12: qty 10

## 2023-06-12 MED ORDER — POLYETHYLENE GLYCOL 3350 17 G PO PACK: 17.0000 g | PACK | Freq: Every day | ORAL | Status: DC | PRN

## 2023-06-12 MED ORDER — DIAZEPAM 5 MG PO TABS
5.0000 mg | ORAL_TABLET | Freq: Two times a day (BID) | ORAL | Status: DC | PRN
  Administered 2023-06-13: 5 mg via ORAL
  Filled 2023-06-12 (×2): qty 1

## 2023-06-12 MED ORDER — OXYCODONE HCL 5 MG PO TABS
5.0000 mg | ORAL_TABLET | Freq: Three times a day (TID) | ORAL | Status: DC | PRN
  Administered 2023-06-13 (×2): 5 mg via ORAL
  Filled 2023-06-12 (×3): qty 1

## 2023-06-12 MED ORDER — ONDANSETRON HCL 4 MG/2ML IJ SOLN: 4.0000 mg | Freq: Four times a day (QID) | INTRAMUSCULAR | Status: DC | PRN

## 2023-06-12 MED ORDER — ONDANSETRON HCL 4 MG/2ML IJ SOLN
4.0000 mg | Freq: Four times a day (QID) | INTRAMUSCULAR | Status: DC | PRN
  Administered 2023-06-12: 4 mg via INTRAVENOUS
  Filled 2023-06-12: qty 2

## 2023-06-12 MED ORDER — POTASSIUM CHLORIDE 10 MEQ/100ML IV SOLN
10.0000 meq | INTRAVENOUS | Status: AC
  Administered 2023-06-12 – 2023-06-13 (×3): 10 meq via INTRAVENOUS
  Filled 2023-06-12 (×3): qty 100

## 2023-06-12 MED ORDER — NICOTINE POLACRILEX 2 MG MT GUM
2.0000 mg | CHEWING_GUM | OROMUCOSAL | Status: DC | PRN
  Administered 2023-06-13 (×2): 2 mg via ORAL
  Filled 2023-06-12 (×3): qty 1

## 2023-06-12 MED ORDER — ALUM & MAG HYDROXIDE-SIMETH 200-200-20 MG/5ML PO SUSP: 30.0000 mL | ORAL | Status: DC | PRN

## 2023-06-12 MED ORDER — SODIUM CHLORIDE 0.9% FLUSH
3.0000 mL | Freq: Two times a day (BID) | INTRAVENOUS | Status: DC
  Administered 2023-06-12 – 2023-06-14 (×4): 3 mL via INTRAVENOUS

## 2023-06-12 NOTE — ED Provider Notes (Signed)
 [place] [facility] DE[facility] AT [facility] Provider Note   CSN: [id number] Arrival date & time: 06/12/23  1938     History  Chief Complaint  Patient presents with   Post-op Problem    Glen Anis Sr. is a 58 y.o. male.  HPI Patient had urologic surgery yesterday.  Discharge this morning.  Reportedly had vomiting while in the hospital but states he was told that since it was simple he could go home.  Continued vomiting of blood.  Showed picture of coffee-ground emesis.  States he can taste blood in his throat.  States he has cancer in his throat and was told he would probably bleed some.  Feels a little lightheaded.   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 Authorizing Provider  brimonidine (ALPHAGAN) 0.2 % ophthalmic solution Place 1 drop into the left eye 3 (three) times daily. 12/05/22   [provider]  diazepam (VALIUM) 5 MG tablet Take 5 mg by mouth 4 (four) times daily.    [provider]  dorzolamide (TRUSOPT) 2 % ophthalmic solution Place 1 drop into the left eye 3 (three) times daily. 12/05/22   [provider]  latanoprost (XALATAN) 0.005 % ophthalmic solution Place 1 drop into the left eye at bedtime.    [provider]  lidocaine (XYLOCAINE) 2 % solution Use as directed 15 mLs in the mouth or throat as needed (for throat pain).    [provider]   nicotine (NICODERM CQ - DOSED IN MG/24 HOURS) 21 mg/24hr patch Place 10.5-21 mg onto the skin See admin instructions. Apply 0.5 new patch to one arm and 1 new patch to the other arm once a day    [provider]  nicotine polacrilex (COMMIT) 4 MG lozenge Take 4 mg by mouth as needed for smoking cessation.    [provider]  nicotine polacrilex (NICORETTE) 4 MG gum Take 4 mg by mouth as needed for smoking cessation.    [provider]  timolol (BETIMOL) 0.5 % ophthalmic solution Place 1 drop into the left eye in the morning.    [provider]      Allergies    Other    Review of Systems   Review of Systems  Physical Exam Updated Vital Signs BP (!) 151/97 (BP Location: Left Arm)   Pulse 95   Temp 98 F (36.7 C) (Oral)   Resp 19   Ht 5\' 8"  (1.727 m)   Wt 91.6 kg   SpO2 100%   BMI 30.71 kg/m  Physical Exam Vitals and nursing note reviewed.  HENT:     Mouth/Throat:     Comments: No active bleeding seen in posterior pharynx. Cardiovascular:     Rate and Rhythm: Regular rhythm.  Pulmonary:     Breath sounds: No wheezing.  Abdominal:     Tenderness: There is no abdominal tenderness.  Musculoskeletal:     Cervical back: Neck supple.  Skin:    Capillary Refill: Capillary refill takes less than 2 seconds.     Coloration: Skin is not pale.  Neurological:     Mental Status: He is alert.     ED Results / Procedures / Treatments   Labs (all labs ordered are listed, but only abnormal results are displayed) Labs Reviewed  COMPREHENSIVE METABOLIC PANEL WITH GFR - Abnormal; Notable for the following components:      Result Value   Potassium 3.4 (*)    Glucose, Bld 175 (*)    BUN 41 (*)    Calcium 8.8 (*)    AST 14 (*)    All other components within normal limits  CBC - Abnormal; Notable for the following components:   RBC 4.19 (*)    Hemoglobin 12.8 (*)    All other components within normal limits  POC OCCULT BLOOD, ED  TYPE AND  SCREEN  ABO/RH    EKG None  Radiology DG C-Arm 1-60 Min-No Report Result Date: 06/11/2023 Fluoroscopy was utilized by the requesting physician.  No radiographic interpretation.   DG C-Arm 1-60 Min-No Report Result Date: 06/11/2023 Fluoroscopy was utilized by the requesting physician.  No radiographic interpretation.    Procedures Procedures    Medications Ordered in ED Medications - No data to display  ED Course/ Medical Decision Making/ A&P                                 Medical Decision Making Amount and/or Complexity of Data Reviewed Labs: ordered. Radiology: ordered.   Patient with urologic procedure yesterday now with likely vomiting blood.  Most likely he has swallowed blood from an oropharyngeal source with known cancer there.  Hemoglobin mildly decreased.  Discussed with urology who states should be admitted to a different service.  Discussed with Dr. Jearld Fenton from ENT who stated that can be admitted but should go to [facility].  Will discuss with hospitalist.          Final Clinical Impression(s) / ED Diagnoses Final diagnoses:  Oral bleeding    Rx / DC Orders ED Discharge Orders     None         Benjiman Core, MD 06/12/23 2107

## 2023-06-12 NOTE — ED Notes (Signed)
 PA at bedside in triage for MSE

## 2023-06-12 NOTE — ED Notes (Signed)
 Report called and given to [facility] RN 2W.

## 2023-06-12 NOTE — ED Triage Notes (Signed)
 Pt underwent a surgical procedure this morning requiring intubation. Sts concern for vomiting blood since discharge. Repots 5 black emesis since discharge. BP 151/97 in triage. Ambulatory. No blood thinner.

## 2023-06-12 NOTE — Discharge Summary (Signed)
 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 Briefly, Glen Anis Sr. is a 58 y.o. year old patient with ureteral cancer presented for ureteral evaluation and f/u of his urethral cancer.   Hospital Course: Patient tolerated the procedure well.  He was then transferred to the floor after an uneventful PACU stay.  His hospital course was uncomplicated.  On POD#1 he had met discharge criteria: was eating a regular diet, was up and ambulating independently,  pain was well controlled, was voiding without a catheter, and was ready to for discharge.   PE on day of discharge: NAD Vitals:   06/11/23 1830 06/11/23 1842 06/11/23 2339 06/12/23 0336  BP: (!) 138/94 (!) 141/100 95/71 118/79  Pulse: 71 78 74 75  Resp: 17 18 20 20   Temp:  97.8 F (36.6 C) 98.1 F (36.7 C) 97.9 F (36.6 C)  TempSrc:  Oral Oral Oral  SpO2: 99% 100% 96% 93%  Weight:      Height:        Intake/Output Summary (Last 24 hours) at 06/12/2023 0734 Last data filed at 06/12/2023 0400 Gross per 24 hour  Intake 2545.37 ml  Output 200 ml  Net 2345.37 ml  Non-labored breathing Abdomen is soft Ext  symmetric  Laboratory values:  Recent Labs    06/12/23 0551  WBC 11.6*  HGB 14.5  HCT 45.0   Recent Labs    06/12/23 0551  NA 137  K 4.2  CL 100  CO2 28  GLUCOSE 164*  BUN 31*  CREATININE 1.23  CALCIUM 9.0   No results for input(s): "LABPT", "INR" in the last 72 hours. No results for input(s): "LABURIN" in the last 72 hours. Results for orders placed or performed during the hospital encounter of 06/01/23  Urine Culture     Status: None   Collection Time: 06/01/23 11:51 PM   Specimen: Urine, Clean Catch  Result Value Ref Range Status   Specimen Description   Final    URINE, CLEAN CATCH Performed at [facility], [address] [place], [place] [address]    Special Requests   Final    NONE Performed at [facility], [address] [place], [place] [address]    Culture   Final    NO GROWTH Performed at [facility], [address] [place], [place] [address]    Report Status 06/03/2023 FINAL  Final    Disposition: Home  Discharge instruction: The patient was instructed to be ambulatory but told to refrain from heavy lifting, strenuous activity, or driving.   Discharge medications:  Allergies as of 06/12/2023       Reactions   Other Other (See Comments)   Patient stated he can't have a MRI due to metal  shrapnel in his body and in his eye.        Medication List     STOP taking these medications    Oxycodone HCl 10 MG Tabs       TAKE these medications    brimonidine 0.2 % ophthalmic solution Commonly known as: ALPHAGAN Place 1 drop into the left eye 3 (three) times daily.   diazepam 5 MG tablet Commonly known as: VALIUM Take 5 mg by mouth 4 (four) times daily.   dorzolamide 2 % ophthalmic solution Commonly known as: TRUSOPT Place 1 drop into the left eye 3 (three) times daily.   latanoprost 0.005 % ophthalmic solution Commonly known as: XALATAN Place 1 drop into the left eye at bedtime.    lidocaine 2 % solution Commonly known as: XYLOCAINE Use as directed 15 mLs in the mouth or throat as needed (for throat pain).   nicotine 21 mg/24hr patch Commonly known as: NICODERM CQ - dosed in mg/24 hours Place 10.5-21 mg onto the skin See admin instructions. Apply 0.5 new patch to one arm and 1 new patch to the other arm once a day   nicotine polacrilex 4 MG gum Commonly known as: NICORETTE Take 4 mg by mouth as needed for smoking cessation.   nicotine polacrilex 4 MG lozenge Commonly known as: COMMIT Take 4 mg by mouth as needed for smoking cessation.   phenazopyridine 200 MG tablet Commonly known as: Pyridium Take 1 tablet (200 mg total) by mouth 3 (three) times daily as needed for pain.   timolol 0.5 % ophthalmic solution Commonly known as: BETIMOL Place 1 drop into the left eye in the morning.        Followup:   Follow-up Information     [name], MD Follow up on 10/14/2023.   Specialty: Urology Why: [place] information: [address] [place] [address] [phone]

## 2023-06-12 NOTE — Progress Notes (Signed)
 Patient had episodes of projectile vomiting after ingesting a large amount of burger and large bowl of soup. Coffee ground vomitus. On Call Starr, notified and verbal orders in placed.

## 2023-06-12 NOTE — H&P (Signed)
 History and Physical    Carmello Cabiness EAV:[id number] DOB: 12-05-1965 DOA: 06/12/2023  PCP: Patient, No Pcp Per   Patient coming from: Home   Chief Complaint:  Chief Complaint  Patient presents with   Post-op Problem    HPI:  Glen ADERMAN Sr. is a 58 y.o. 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  Reports having episodes of coffee-ground emesis this morning although was not able to see due to his visual impairment.  Had one episode of vomiting this AM, then had 2 additional episodes this afternoon around 4 to 5 PM.  He took a picture of the last episode and scented to friend who is a nephrologist to advised him it looks like coffee-ground emesis and advised him to go to the emergency department.  Notes that prior to some of the episodes he develops epigastric pain and burning and hiccups.  No pain in his oropharynx /sore throat per his report.  Has noted associated black stool.  Not on any anticoagulants or antiplatelets.  Per chart review has had an abnormal Pap with abnormal distal esophageal uptake and had declined EGD to evaluate.  No other EGDs I can see in the system.    Review of Systems:  ROS complete and negative except as marked above   Allergies  Allergen Reactions   Other Other (See Comments)    Patient stated he can't have a MRI due to metal shrapnel in his body and in his eye.    Prior to Admission medications   Medication Sig Start Date End Date Taking? Authorizing Provider  brimonidine (ALPHAGAN) 0.2 % ophthalmic solution Place 1 drop into the left eye 3 (three) times daily. 12/05/22   [provider]  diazepam (VALIUM) 5 MG tablet Take 5 mg by mouth 4 (four) times daily.    [provider]  dorzolamide (TRUSOPT) 2 % ophthalmic solution Place 1 drop  into the left eye 3 (three) times daily. 12/05/22   [provider]  latanoprost (XALATAN) 0.005 % ophthalmic solution Place 1 drop into the left eye at bedtime.    [provider]  lidocaine (XYLOCAINE) 2 % solution Use as directed 15 mLs in the mouth or throat as needed (for throat pain).    [provider]  nicotine (NICODERM CQ - DOSED IN MG/24 HOURS) 21 mg/24hr patch Place 10.5-21 mg onto the skin See admin instructions. Apply 0.5 new patch to one arm and 1 new patch to the other arm once a day    [provider]  nicotine polacrilex (COMMIT) 4 MG lozenge Take 4 mg by mouth as needed for smoking cessation.    [provider]  nicotine polacrilex (NICORETTE) 4 MG gum Take 4 mg by mouth as needed for smoking cessation.    [provider]  timolol (BETIMOL) 0.5 % ophthalmic solution Place 1 drop into the left eye in the morning.    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   neck and throat cancer urologist--- dr Marlou Porch;   dx 12-12-2021  s/p  ureteral bx ,  noninvasive low grade papillary urothelial cnacer    Past Surgical History:  Procedure Laterality Date   APPENDECTOMY     CYSTOSCOPY W/ URETERAL STENT PLACEMENT Bilateral 12/12/2021   Procedure: CYSTOSCOPY WITH BILATERAL RETROGRADE PYELOGRAM/URETERAL DILATION/URETEROSCOPY/URETERAL STENT PLACEMENT/RIGHT DIAGNOSTIC URETEROSCOPY, URETERAL BIOPSY;LEFT LASER LITHOTRISPY/STONE EXTRACTION;  Surgeon: [name], MD;  Location: [facility];  Service: Urology;  Laterality: Bilateral;   CYSTOSCOPY WITH BIOPSY Right 06/11/2023    Procedure: Urethral tumor excision; Bladder biopsy with fulguration; Right retrograde pyelogram with interpretation; Right ureteral balloon dilation of mid ureteral stricture; Right ureteral brush biopsy; Right ureteral stent exchange;  Surgeon: [name], MD;  Location: [facility];  Service: Urology;  Laterality: Right;  CYSTOSCOPY, POSSIBLE URETERAL BIOPSY, RIGHT RETROGRADE PYELOGRAM, RIGHT   CYSTOSCOPY WITH STENT PLACEMENT Bilateral 01/09/2022   Procedure: LEFT STENT REMOVAL, RIGHT STENT EXCHANGE;  Surgeon: [name], MD;  Location: [facility];  Service: Urology;  Laterality: Bilateral;   CYSTOSCOPY WITH STENT PLACEMENT Right 01/10/2023   Procedure: CYSTOSCOPY WITH RIGHT URETERAL TUMOR RESECTION AND RIGHT URETERAL STENT EXCHANGE; RIGHT URETERAL BIOPSY; URETHERAL BIOPSY; RIGHT URETERAL BALLOON DILATION; URETHERAL FULGARATION;  Surgeon: [name], MD;  Location: [facility];  Service: Urology;  Laterality: Right;  75 MINUTES   CYSTOSCOPY WITH URETEROSCOPY AND STENT PLACEMENT Right 08/28/2022   Procedure: CYSTOSCOPY WITH  RIGHT URETEROSCOPY AND RIGHT URETERAL  STENT EXCHANGE;  Surgeon: [name], MD;  Location: [facility];  Service: Urology;  Laterality: Right;  60 MINUTES   EYE SURGERY     MASS EXCISION Right 01/09/2022   Procedure: RIGHT URETERAL MASS EXCISION;  Surgeon: [name], MD;  Location: [facility];  Service: Urology;  Laterality: Right;   URETERAL BIOPSY Right 08/28/2022   Procedure: ABLATION AND BIOPSY OF URETERAL MASS/TUMOR;  Surgeon: [name], MD;  Location: [facility];  Service: Urology;  Laterality: Right;     reports that he has been smoking cigarettes. He has never used smokeless tobacco. He reports that he does not drink alcohol and does not use drugs.  Family History  Problem Relation Age of Onset   Cancer Mother        breast   Heart failure Mother        pacemaker   Diabetes Father        deceased    Heart failure Father      Physical Exam: Vitals:   06/12/23 1944 06/12/23 1945  BP: (!) 151/97   Pulse: 95   Resp: 19   Temp: 98 F (36.7 C)   TempSrc: Oral   SpO2: 100%   Weight:  91.6 kg  Height:  5\' 8"  (1.727 m)    Gen: Awake, alert, NAD  HEENT: R tonsillar mass with no active bleeding noted.   CV: Regular, normal S1, S2, no murmurs  Resp: Normal WOB, CTAB  Abd: Flat, normoactive, nontender MSK: Symmetric, no edema  Skin: No rashes or lesions to exposed skin  Neuro: Alert and interactive  Psych: euthymic, appropriate    Data review:   Labs reviewed, notable for:   K 3.4  Cr 1.1, similar to prior  Hb 14.5 -> 12.8   Micro:  Results for orders placed or performed during the hospital encounter of 06/01/23  Urine Culture     Status: None   Collection Time: 06/01/23 11:51 PM   Specimen: Urine, Clean Catch  Result Value Ref Range Status   Specimen Description   Final    URINE, CLEAN CATCH Performed at [facility], [address] [place], [place] [address]    Special Requests   Final    NONE Performed at [facility], [address], [place], [place] [address]    Culture   Final    NO GROWTH Performed at [facility], [address] [place], [place] [address]    Report Status 06/03/2023 FINAL  Final    Imaging reviewed:   Personally Reviewed CXR  Area of scarring / atelectasis in Left lower lung fields. Otherwise no infiltrate    DG C-Arm 1-60 Min-No Report Result Date: 06/11/2023 Fluoroscopy was utilized by the requesting physician.  No radiographic interpretation.   DG C-Arm 1-60 Min-No Report Result Date: 06/11/2023 Fluoroscopy was utilized by the requesting physician.  No radiographic interpretation.    ED Course:   EDP discussed case with ENT Dr. Jearld Fenton who recommends for admission to [facility] for ENT consult in AM.    Assessment/Plan:  58 y.o. 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 Oropharyngeal v Upper GI source.   Coffee ground emesis- ? Oropharyngeal v Upper GI source.  Acute onset 4/10 AM with 3 episodes, visual impairment delaying his presentation. Exam with no active bleeding from R tonsillar mass. Hemodynamically stable, with Hb down to 12.8 from 14.5 this AM. Clinically with recent anesthesia / intubation for Urologic procedure may have limited tonsillar bleeding that has resolved. Vs. Possibly upper GI bleeding. Notably prior PET with increased activity in distal esophagus and he had declined EGD in the past.  -Admit to [facility] for ENT consult in the morning. -If there is no evidence of recent tonsillar mass bleeding ENTs evaluation would pursue endoscopy. I have messaged Dr. Chales Abrahams from [facility] for routine consult in AM  -Clear liquid diet and n.p.o. after midnight for possible procedure in the morning. -Trend CBC, check coags in the morning. -Started on pantoprazole 40 mg IV every 12 hours to cover for possible upper GI source.  History R ureteral cancer S/p resection of a urethral mass, bladder biopsy with fulguration, right ureteral brush biopsy and stent exchange on 4/9 with Dr. Marlou Porch.  -No urologic complication post procedures, routine follow-up outpatient.  History SCC R tonsill  Follows with [facility], Dr. Ronne Binning. Had partial treatment prior, planned to resume Cisplatin and have radiation therapy in future.  -Routine Oncology follow-up outpatient  Chronic medical problems: Prosthetic R eye: Noted Glaucoma/blind OS: Continue home glaucoma drops Chronic pain related to sciatica: Reports still taking oxycodone 10 mg 3 times daily although he had a 4-day course prescribed on 4/4.  Should be out of these pills.  Reduce oxycodone to 5 mg 3 times daily with plan to taper  off and discontinue.  Should see pain management or PCP outpatient.  Discussed avoiding IV opiates. Anxiety: Reports he is still taking diazepam although should also be out of this medication had a 30-day course prescribed in February.  Since reports still taking to avoid any withdrawal reduce to 5 mg twice daily.  Will need to see outpatient provider  for any refill.    Body mass index is 30.71 kg/m.  Obesity class I, would benefit from weight loss outpatient.  DVT prophylaxis:  SCDs Code Status:  Full Code Diet:  Diet Orders (From admission, onward)     Start     Ordered   06/12/23 2158  Diet clear liquid Room service appropriate? Yes; Fluid consistency: Thin  Diet effective now       Question Answer Comment  Room service appropriate? Yes   Fluid consistency: Thin      06/12/23 2159           Family Communication:  None   Consults:  ENT, GI [place]   Admission status:   Inpatient, Telemetry bed  Severity of Illness: The appropriate patient status for this patient is INPATIENT. Inpatient status is judged to be reasonable and necessary in order to provide the required intensity of service to ensure the patient's safety. [name] presenting symptoms, physical exam findings, and initial radiographic and laboratory data in the context of their chronic comorbidities is felt to place them at high risk for further clinical deterioration. Furthermore, it is not anticipated that the patient will be medically stable for discharge from the hospital within 2 midnights of admission.   * I certify that at the point of admission it is my clinical judgment that the patient will require inpatient hospital care spanning beyond 2 midnights from the point of admission due to high intensity of service, high risk for further deterioration and high frequency of surveillance required.*   Dolly Rias, MD [facility]  How to contact the [facility] Attending or Consulting provider 7A - 7P or  covering provider during after hours 7P -7A, for this patient.  Check the care team in [facility] and look for a) attending/consulting [facility] provider listed and b) the [facility] team listed Log into [email or web address] and use [place]'s universal password to access. If you do not have the password, please contact the hospital operator. Locate the [facility] provider you are looking for under [facility] and page to a number that you can be directly reached. If you still have difficulty reaching the provider, please page the [facility] (Director on Call) for the Hospitalists listed on amion for assistance.  06/12/2023, 10:04 PM

## 2023-06-12 NOTE — ED Notes (Signed)
 Pt transport called and set-up with [facility].

## 2023-06-12 NOTE — TOC Transition Note (Signed)
 Transition of Care [facility]) - Discharge Note  Patient Details  Name: Glen MOFFA Sr. MRN: [id number] Date of Birth: [birth year]  Transition of Care [facility]) CM/SW Contact:  Ewing Schlein, LCSW Phone Number: 06/12/2023, 9:06 AM  Clinical Narrative: SDOH screening indicated food insecurity. Food pantry resources added to AVS. TOC signing off.  Final next level of care: Home/Self Care Barriers to Discharge: No Barriers Identified  Patient Goals and CMS Choice Choice offered to / list presented to : NA  Discharge Plan and Services Additional resources added to the After Visit Summary for          DME Arranged: N/A DME Agency: NA  Social Drivers of Health (SDOH) Interventions SDOH Screenings   Food Insecurity: Food Insecurity Present (06/11/2023)  Housing: High Risk (06/11/2023)  Transportation Needs: No Transportation Needs (06/11/2023)  Utilities: Not At Risk (06/11/2023)  Alcohol Screen: Low Risk  (10/15/2022)  Depression (PHQ2-9): Low Risk  (10/15/2022)  Social Connections: Unknown (10/30/2022)   Received from [facility]  Tobacco Use: High Risk (06/11/2023)   Readmission Risk Interventions     No data to display

## 2023-06-13 DIAGNOSIS — K920 Hematemesis: Secondary | ICD-10-CM | POA: Diagnosis not present

## 2023-06-13 DIAGNOSIS — K921 Melena: Secondary | ICD-10-CM

## 2023-06-13 LAB — BASIC METABOLIC PANEL WITH GFR
Anion gap: 6 (ref 5–15)
BUN: 21 mg/dL — ABNORMAL HIGH (ref 6–20)
CO2: 28 mmol/L (ref 22–32)
Calcium: 7.9 mg/dL — ABNORMAL LOW (ref 8.9–10.3)
Chloride: 105 mmol/L (ref 98–111)
Creatinine, Ser: 1 mg/dL (ref 0.61–1.24)
GFR, Estimated: >60 mL/min (ref >60)
Glucose, Bld: 138 mg/dL — ABNORMAL HIGH (ref 70–99)
Potassium: 3.9 mmol/L (ref 3.5–5.1)
Sodium: 139 mmol/L (ref 135–145)

## 2023-06-13 LAB — HIV ANTIBODY (ROUTINE TESTING W REFLEX): HIV Screen 4th Generation wRfx: NONREACTIVE

## 2023-06-13 LAB — PROTIME-INR
INR: 1.2 (ref 0.8–1.2)
Prothrombin Time: 15.2 s (ref 11.4–15.2)

## 2023-06-13 LAB — HEMOGLOBIN AND HEMATOCRIT, BLOOD
HCT: 36.1 % — ABNORMAL LOW (ref 39.0–52.0)
HCT: 34.8 % — ABNORMAL LOW (ref 39.0–52.0)
HCT: 35.3 % — ABNORMAL LOW (ref 39.0–52.0)
Hemoglobin: 12.1 g/dL — ABNORMAL LOW (ref 13.0–17.0)
Hemoglobin: 11.8 g/dL — ABNORMAL LOW (ref 13.0–17.0)
Hemoglobin: 11.9 g/dL — ABNORMAL LOW (ref 13.0–17.0)

## 2023-06-13 LAB — CBC
HCT: 34.3 % — ABNORMAL LOW (ref 39.0–52.0)
Hemoglobin: 11.6 g/dL — ABNORMAL LOW (ref 13.0–17.0)
MCH: 31.3 pg (ref 26.0–34.0)
MCHC: 33.8 g/dL (ref 30.0–36.0)
MCV: 92.5 fL (ref 80.0–100.0)
Platelets: 152 10*3/uL (ref 150–400)
RBC: 3.71 MIL/uL — ABNORMAL LOW (ref 4.22–5.81)
RDW: 13.8 % (ref 11.5–15.5)
WBC: 6.3 10*3/uL (ref 4.0–10.5)
nRBC: 0 % (ref 0.0–0.2)

## 2023-06-13 LAB — PHOSPHORUS: Phosphorus: 2.8 mg/dL (ref 2.5–4.6)

## 2023-06-13 LAB — TYPE AND SCREEN
ABO/RH(D): A POS
Antibody Screen: NEGATIVE

## 2023-06-13 LAB — SURGICAL PATHOLOGY

## 2023-06-13 LAB — MAGNESIUM: Magnesium: 1.7 mg/dL (ref 1.7–2.4)

## 2023-06-13 MED ORDER — POTASSIUM CHLORIDE 10 MEQ/100ML IV SOLN
10.0000 meq | Freq: Once | INTRAVENOUS | Status: AC
  Administered 2023-06-13: 10 meq via INTRAVENOUS
  Filled 2023-06-13: qty 100

## 2023-06-13 MED ORDER — SODIUM CHLORIDE 0.9 % NICU IV INFUSION SIMPLE
500.0000 mL | INJECTION | Freq: Once | INTRAVENOUS | Status: AC
  Administered 2023-06-13: 500 mL via INTRAVENOUS
  Filled 2023-06-13: qty 500

## 2023-06-13 MED ORDER — ORAL CARE MOUTH RINSE: 15.0000 mL | OROMUCOSAL | Status: DC | PRN

## 2023-06-13 MED ORDER — HYDROMORPHONE HCL 1 MG/ML IJ SOLN
0.5000 mg | Freq: Four times a day (QID) | INTRAMUSCULAR | Status: DC | PRN
  Filled 2023-06-13: qty 0.5

## 2023-06-13 MED ORDER — OXYCODONE HCL 5 MG PO TABS
10.0000 mg | ORAL_TABLET | Freq: Once | ORAL | Status: AC
  Administered 2023-06-13: 10 mg via ORAL
  Filled 2023-06-13: qty 2
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
## 2023-06-13 NOTE — ED Notes (Signed)
 Pt picked up by [facility]. 3rd run of K+ infusing. All belongings sent with Pt.
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
## 2023-06-13 NOTE — Anesthesia Preprocedure Evaluation (Signed)
 Anesthesia Evaluation    History of Anesthesia Complications (+) history of anesthetic complications  Airway Mallampati: I  TM Distance: >3 FB Neck ROM: Full    Dental  (+) Edentulous Upper, Dental Advisory Given   Pulmonary neg pulmonary ROS, Current Smoker   breath sounds clear to auscultation       Cardiovascular negative cardio ROS  Rhythm:Regular Rate:Normal     Neuro/Psych   Anxiety      Neuromuscular disease    GI/Hepatic negative GI ROS, Neg liver ROS,,,  Endo/Other  negative endocrine ROS    Renal/GU Renal disease  negative genitourinary   Musculoskeletal negative musculoskeletal ROS (+)    Abdominal   Peds  Hematology  (+) Blood dyscrasia, anemia Lab Results      Component                Value               Date                      WBC                      6.3                 06/13/2023                HGB                      11.8 (L)            06/13/2023                HCT                      34.8 (L)            06/13/2023                MCV                      92.5                06/13/2023                PLT                      152                 06/13/2023              Anesthesia Other Findings   Reproductive/Obstetrics                             Anesthesia Physical Anesthesia Plan  ASA: 3  Anesthesia Plan: General   Post-op Pain Management: Tylenol PO (pre-op)*   Induction: Intravenous  PONV Risk Score and Plan: 2 and Ondansetron and Midazolam  Airway Management Planned: Oral ETT and Video Laryngoscope Planned  Additional Equipment: None  Intra-op Plan:   Post-operative Plan: Extubation in OR  Informed Consent: I have reviewed the patients History and Physical, chart, labs and discussed the procedure including the risks, benefits and alternatives for the proposed anesthesia with the patient or authorized representative who has indicated his/her  understanding and acceptance.     Dental advisory given  Plan Discussed with: CRNA  Anesthesia Plan Comments:  Anesthesia Quick Evaluation
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
## 2023-06-14 ENCOUNTER — Encounter (HOSPITAL_COMMUNITY): Payer: Self-pay | Admitting: Internal Medicine

## 2023-06-14 ENCOUNTER — Inpatient Hospital Stay (HOSPITAL_COMMUNITY): Payer: Self-pay | Admitting: Anesthesiology

## 2023-06-14 ENCOUNTER — Encounter (HOSPITAL_COMMUNITY)
Admission: EM | Disposition: A | Discharge status: Home / Self Care | Payer: Self-pay | Source: Home / Self Care | Attending: Internal Medicine

## 2023-06-14 DIAGNOSIS — D62 Acute posthemorrhagic anemia: Secondary | ICD-10-CM

## 2023-06-14 DIAGNOSIS — K222 Esophageal obstruction: Secondary | ICD-10-CM | POA: Diagnosis not present

## 2023-06-14 DIAGNOSIS — K449 Diaphragmatic hernia without obstruction or gangrene: Secondary | ICD-10-CM

## 2023-06-14 DIAGNOSIS — K2100 Gastro-esophageal reflux disease with esophagitis, without bleeding: Secondary | ICD-10-CM

## 2023-06-14 DIAGNOSIS — R933 Abnormal findings on diagnostic imaging of other parts of digestive tract: Secondary | ICD-10-CM

## 2023-06-14 DIAGNOSIS — K920 Hematemesis: Secondary | ICD-10-CM | POA: Diagnosis not present

## 2023-06-14 HISTORY — PX: ESOPHAGOGASTRODUODENOSCOPY: SHX5428

## 2023-06-14 LAB — HEMOGLOBIN AND HEMATOCRIT, BLOOD
HCT: 33.6 % — ABNORMAL LOW (ref 39.0–52.0)
Hemoglobin: 11.6 g/dL — ABNORMAL LOW (ref 13.0–17.0)

## 2023-06-14 SURGERY — EGD (ESOPHAGOGASTRODUODENOSCOPY)
Anesthesia: General

## 2023-06-14 MED ORDER — PHENYLEPHRINE 80 MCG/ML (10ML) SYRINGE FOR IV PUSH (FOR BLOOD PRESSURE SUPPORT)
PREFILLED_SYRINGE | INTRAVENOUS | Status: DC | PRN
  Administered 2023-06-14 (×2): 80 ug via INTRAVENOUS

## 2023-06-14 MED ORDER — PROPOFOL 10 MG/ML IV BOLUS
INTRAVENOUS | Status: DC | PRN
  Administered 2023-06-14: 30 mg via INTRAVENOUS

## 2023-06-14 MED ORDER — SODIUM CHLORIDE 0.9 % IV SOLN: INTRAVENOUS | Status: DC | PRN

## 2023-06-14 MED ORDER — PANTOPRAZOLE SODIUM 40 MG PO TBEC: 40.0000 mg | DELAYED_RELEASE_TABLET | Freq: Two times a day (BID) | ORAL | Status: DC

## 2023-06-14 MED ORDER — LIDOCAINE 2% (20 MG/ML) 5 ML SYRINGE
INTRAMUSCULAR | Status: DC | PRN
Start: 2023-06-14 — End: 2023-06-14
  Administered 2023-06-14: 100 mg via INTRAVENOUS

## 2023-06-14 MED ORDER — PROPOFOL 500 MG/50ML IV EMUL
INTRAVENOUS | Status: DC | PRN
Start: 2023-06-14 — End: 2023-06-14
  Administered 2023-06-14: 150 ug/kg/min via INTRAVENOUS

## 2023-06-14 MED ORDER — POLYETHYLENE GLYCOL 3350 17 G PO PACK: 17.0000 g | PACK | Freq: Every day | ORAL | 0 refills | Status: AC | PRN

## 2023-06-14 MED ORDER — PANTOPRAZOLE SODIUM 40 MG PO TBEC: DELAYED_RELEASE_TABLET | ORAL | 1 refills | Status: AC

## 2023-06-14 NOTE — Plan of Care (Signed)

## 2023-06-14 NOTE — Progress Notes (Signed)
 Discharge Nurse went over discharge instructions with patient and removed PIV. Patient will discharge with discharge nurse downstairs to personal vehicle with family member. Patient voiced no concerns or complaints. Care relinquished.

## 2023-06-14 NOTE — Plan of Care (Signed)

## 2023-06-14 NOTE — Interval H&P Note (Signed)
 History and Physical Interval Note:  06/14/2023 8:07 AM  Glen Mcmillan Sr.  has presented today for surgery, with the diagnosis of coffee ground emesis.  The various methods of treatment have been discussed with the patient and family. After consideration of risks, benefits and other options for treatment, the patient has consented to  Procedure(s): EGD (ESOPHAGOGASTRODUODENOSCOPY) (N/A) as a surgical intervention.  [name] history has been reviewed, patient examined, no change in status, stable for surgery.  I have reviewed the patient's chart and labs.  Questions were answered to the patient's satisfaction.    Hgb stable at 11.6 this AM  Kerby Pearson III

## 2023-06-14 NOTE — Op Note (Signed)
 [name] Patient Name: Glen Mcmillan Procedure Date : 06/14/2023 MRN: [id number] Attending MD: Roel Clarity. 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 Dominic Friendly, MD, Lorenzo Romberg, RN, Judith Novak, Technician Referring MD:             [facility] hospitalist Medicines:                Monitored Anesthesia Care Complications:            No immediate complications. Estimated Blood Loss:     Estimated blood loss: none. Procedure:                Pre-Anesthesia Assessment:                           - Prior to the procedure, a History and Physical                            was performed, and patient medications and                            allergies were reviewed. [name] tolerance of                            previous anesthesia was also reviewed. The risks                            and benefits of the procedure and the sedation                            options and risks were discussed with the patient.                            All questions were answered, and informed consent                            was obtained. Prior Anticoagulants: The patient has  taken no anticoagulant or antiplatelet agents. ASA                            Grade Assessment: III - A patient with severe                            systemic disease. After reviewing the risks and                            benefits, the patient was deemed in satisfactory                            condition to undergo the procedure.                           After  obtaining informed consent, the endoscope was                            passed under direct vision. Throughout the                            procedure, the patient's blood pressure, pulse, and                            oxygen saturations were monitored continuously. The                            [id number]-[id number] ([id number]) Olympus endoscope was introduced                            through the mouth, and advanced to the second part                            of duodenum. The upper GI endoscopy was                            accomplished without difficulty. The patient                            tolerated the procedure well. Scope In: Scope Out: Findings:      LA Grade D (one or more mucosal breaks involving at least 75% of       esophageal circumference) esophagitis with no bleeding was found in the       lower third of the esophagus. Mild stricture EGJ.      A small hiatal hernia was present.      The stomach was normal.      The cardia and gastric fundus were normal on retroflexion.      The examined duodenum was normal. Impression:               - LA Grade D reflux esophagitis with no bleeding.                           - Small hiatal hernia.                           -  Normal stomach.                           - Normal examined duodenum.                           - No specimens collected. Recommendation:           - Return patient to hospital ward for possible                            discharge same day.                           - Resume regular diet.                           - Pantoprazole 40 mg by mouth twice daily for 8                            weeks, then decrease to once daily indefinitely.                           Patient can be discharged from a GI perspective.                           Antireflux diet and lifestyle measures. Procedure Code(s):        --- Professional ---                           [phone], Esophagogastroduodenoscopy, flexible,                             transoral; diagnostic, including collection of                            specimen(s) by brushing or washing, when performed                            (separate procedure) Diagnosis Code(s):        --- Professional ---                           K21.00, Gastro-esophageal reflux disease with                            esophagitis, without bleeding                           K44.9, Diaphragmatic hernia without obstruction or                            gangrene                           D62, Acute posthemorrhagic anemia  K92.0, Hematemesis                           R93.3, Abnormal findings on diagnostic imaging of                            other parts of digestive tract CPT copyright 2022 [employer]. All rights reserved. The codes documented in this report are preliminary and upon coder review may  be revised to meet current compliance requirements. Sherron Mapp L. Dominic Friendly, MD 06/14/2023 8:39:30 AM This report has been signed electronically. Number of Addenda: 0

## 2023-06-14 NOTE — Discharge Summary (Signed)
 Physician Discharge Summary  Patient ID: Glen Erie Sr. MRN: [id number] DOB/AGE: 06-Dec-1965 58 y.o.  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  Patient was admitted with coffee-ground emesis.  Patient was admitted for further assessment and management.  ENT and GI teams were consulted to assist with patient's management.  ENT team confirmed that the bleeding was not coming from the tonsil.  GI team performed EGD.  EGD revealed LA grade D esophagitis, with no bleeding.  Patient be discharged on Protonix 40 Mg p.o. twice daily for 8 weeks, then Protonix 40 Mg p.o. once daily for life.  Hemoglobin has remained stable.  Patient will follow-up with primary care provider, GI team and urology team on discharge.  Coffee ground emesis: -GI input is appreciated. -ENT input is appreciated. -See above documentation. -H/H is stable. -EGD revealed LA grade D esophagitis. -Protonix 40 Mg p.o. twice daily for 8 weeks, then 40 Mg p.o. once daily for life..    History R ureteral cancer: S/p resection of a urethral mass, bladder biopsy with fulguration, right ureteral brush biopsy and stent exchange on 4/9 with Dr. Dulcy Gibney.    History SCC R tonsill: Follows with [facility], Dr. Claretta Croft. Had partial treatment prior, planned to resume Cisplatin and have radiation therapy in future.  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  Obesity class I, would benefit from weight loss outpatient.  Consults: GI and ENT  Significant Diagnostic Studies:  EGD revealed: LA Grade D (one or more mucosal breaks involving at least 75% of       esophageal circumference) esophagitis with no bleeding was found in the       lower third of the esophagus. Mild stricture EGJ.      A small hiatal hernia was present.      The stomach was normal.      The cardia and gastric fundus were normal on retroflexion.      The examined duodenum was normal. Impression:               - LA Grade D reflux esophagitis with no bleeding.                           - Small hiatal hernia.                           - Normal stomach.                           - Normal examined duodenum.                           - No specimens  collected.   Treatments: Protonix 40 Mg p.o. twice daily for 8 weeks, then continue Protonix 40 Mg p.o. once daily for life.  Discharge Exam: Blood pressure 114/82, pulse 72, temperature 97.8 F (36.6 C), resp. rate 16, height 5\' 8"  (1.727 m), weight 91.6 kg, SpO2 100%.   Disposition: Discharge disposition: 01-Home or Self Care       Discharge Instructions     Diet - low sodium heart healthy   Complete by: As directed    Increase activity slowly   Complete by: As directed       Allergies as of 06/14/2023       Reactions   Other Other (See Comments)   Patient stated he can't have a MRI due to metal shrapnel in his body and in his eye.        Medication List     STOP taking these medications    ondansetron 4 MG tablet Commonly known as: ZOFRAN       TAKE these medications    brimonidine 0.2 % ophthalmic solution Commonly known as: ALPHAGAN Place 1 drop into the left eye 3 (three) times daily.   diazepam 5 MG tablet Commonly known as:  VALIUM Take 5 mg by mouth every 12 (twelve) hours as needed for anxiety.   dorzolamide 2 % ophthalmic solution Commonly known as: TRUSOPT Place 1 drop into the left eye 3 (three) times daily.   latanoprost 0.005 % ophthalmic solution Commonly known as: XALATAN Place 1 drop into the left eye at bedtime.   nicotine 21 mg/24hr patch Commonly known as: NICODERM CQ - dosed in mg/24 hours Place 10.5-21 mg onto the skin See admin instructions. Apply 0.5 new patch to one arm and 1 new patch to the other arm once a day   oxybutynin 10 MG 24 hr tablet Commonly known as: DITROPAN-XL Take 10 mg by mouth at bedtime.   Oxycodone HCl 10 MG Tabs Take 1 tablet by mouth every 6 (six) hours as needed.   pantoprazole 40 MG tablet Commonly known as: PROTONIX Protonix 40 Mg p.o. twice daily for 8 weeks, then 40 Mg p.o. once daily indefinitely.   polyethylene glycol 17 g packet Commonly known as: MIRALAX / GLYCOLAX Take 17 g by mouth daily as needed for mild constipation.   timolol 0.5 % ophthalmic solution Commonly known as: BETIMOL Place 1 drop into the left eye in the morning.        Time spent: 35 minutes.  SignedDoroteo Gasmen 06/14/2023, 12:01 PM

## 2023-06-14 NOTE — Anesthesia Procedure Notes (Signed)
 Procedure Name: MAC Date/Time: 06/14/2023 8:23 AM  Performed by: Rayfield Cairo, CRNAPre-anesthesia Checklist: Patient identified, Emergency Drugs available, Suction available and Patient being monitored Patient Re-evaluated:Patient Re-evaluated prior to induction Oxygen Delivery Method: Nasal cannula Dental Injury: Teeth and Oropharynx as per pre-operative assessment

## 2023-06-14 NOTE — Transfer of Care (Signed)
 Immediate Anesthesia Transfer of Care Note  Patient: Glen Erie Sr.  Procedure(s) Performed: EGD (ESOPHAGOGASTRODUODENOSCOPY)  Patient Location: PACU  Anesthesia Type:General  Level of Consciousness: awake, alert , and oriented  Airway & Oxygen Therapy: Patient Spontanous Breathing and Patient connected to nasal cannula oxygen  Post-op Assessment: Report given to RN and Post -op Vital signs reviewed and stable  Post vital signs: Reviewed and stable  Last Vitals:  Vitals Value Taken Time  BP 109/85 06/14/23 0840  Temp    Pulse 61 06/14/23 0842  Resp 18 06/14/23 0842  SpO2 99 % 06/14/23 0842  Vitals shown include unfiled device data.  Last Pain:  Vitals:   06/14/23 0812  TempSrc: Temporal  PainSc: 0-No pain      Patients Stated Pain Goal: 4 (06/13/23 0428)  Complications: No notable events documented.

## 2023-06-14 NOTE — Anesthesia Postprocedure Evaluation (Signed)
 Anesthesia Post Note  Patient: Glen Erie Sr.  Procedure(s) Performed: EGD (ESOPHAGOGASTRODUODENOSCOPY)     Patient location during evaluation: PACU Anesthesia Type: MAC Level of consciousness: awake and alert Pain management: pain level controlled Vital Signs Assessment: post-procedure vital signs reviewed and stable Respiratory status: spontaneous breathing, nonlabored ventilation, respiratory function stable and patient connected to nasal cannula oxygen Cardiovascular status: stable and blood pressure returned to baseline Postop Assessment: no apparent nausea or vomiting Anesthetic complications: no   No notable events documented.  Last Vitals:  Vitals:   06/14/23 0910 06/14/23 0923  BP: 132/63 114/82  Pulse: 68 72  Resp: 16   Temp: 36.6 C   SpO2: 99% 100%    Last Pain:  Vitals:   06/14/23 0910  TempSrc:   PainSc: 3                  Lasha Echeverria P Julyan Gales

## 2023-06-17 ENCOUNTER — Encounter (HOSPITAL_COMMUNITY): Payer: Self-pay | Admitting: Gastroenterology

## 2023-07-08 ENCOUNTER — Other Ambulatory Visit (HOSPITAL_COMMUNITY): Payer: Self-pay
